# Patient Record
Sex: Female | Born: 1945 | Race: White | Hispanic: No | Marital: Married | Smoking: Never smoker
Health system: Southern US, Community
[De-identification: ages and names within clinical notes are randomized; demographics above are authoritative.]

## PROBLEM LIST (undated history)

## (undated) DIAGNOSIS — I351 Nonrheumatic aortic (valve) insufficiency: Secondary | ICD-10-CM

## (undated) DIAGNOSIS — I5189 Other ill-defined heart diseases: Secondary | ICD-10-CM

## (undated) DIAGNOSIS — M8580 Other specified disorders of bone density and structure, unspecified site: Secondary | ICD-10-CM

## (undated) DIAGNOSIS — I498 Other specified cardiac arrhythmias: Secondary | ICD-10-CM

## (undated) DIAGNOSIS — I2510 Atherosclerotic heart disease of native coronary artery without angina pectoris: Secondary | ICD-10-CM

## (undated) DIAGNOSIS — Z86010 Personal history of colonic polyps: Secondary | ICD-10-CM

## (undated) DIAGNOSIS — IMO0002 Reserved for concepts with insufficient information to code with codable children: Secondary | ICD-10-CM

## (undated) DIAGNOSIS — IMO0001 Reserved for inherently not codable concepts without codable children: Secondary | ICD-10-CM

## (undated) DIAGNOSIS — C50919 Malignant neoplasm of unspecified site of unspecified female breast: Secondary | ICD-10-CM

## (undated) DIAGNOSIS — R9430 Abnormal result of cardiovascular function study, unspecified: Secondary | ICD-10-CM

## (undated) HISTORY — PX: COLONOSCOPY: SHX174

## (undated) HISTORY — DX: Atherosclerotic heart disease of native coronary artery without angina pectoris: I25.10

## (undated) HISTORY — DX: Other specified disorders of bone density and structure, unspecified site: M85.80

## (undated) HISTORY — PX: OTHER SURGICAL HISTORY: SHX169

## (undated) HISTORY — PX: LYMPH NODE BIOPSY: SHX201

## (undated) HISTORY — DX: Malignant neoplasm of unspecified site of unspecified female breast: C50.919

## (undated) HISTORY — PX: MOUTH SURGERY: SHX715

## (undated) HISTORY — DX: Other ill-defined heart diseases: I51.89

## (undated) HISTORY — DX: Reserved for inherently not codable concepts without codable children: IMO0001

## (undated) HISTORY — DX: Other specified cardiac arrhythmias: I49.8

## (undated) HISTORY — DX: Personal history of colonic polyps: Z86.010

## (undated) HISTORY — DX: Nonrheumatic aortic (valve) insufficiency: I35.1

## (undated) HISTORY — PX: FACIAL COSMETIC SURGERY: SHX629

## (undated) HISTORY — DX: Reserved for concepts with insufficient information to code with codable children: IMO0002

## (undated) HISTORY — PX: BREAST RECONSTRUCTION: SHX9

## (undated) HISTORY — DX: Abnormal result of cardiovascular function study, unspecified: R94.30

---

## 1980-11-21 HISTORY — PX: OOPHORECTOMY: SHX86

## 1981-06-10 ENCOUNTER — Encounter (INDEPENDENT_AMBULATORY_CARE_PROVIDER_SITE_OTHER): Payer: Self-pay | Admitting: Gastroenterology

## 1982-11-11 ENCOUNTER — Encounter (INDEPENDENT_AMBULATORY_CARE_PROVIDER_SITE_OTHER): Payer: Self-pay | Admitting: *Deleted

## 1986-08-20 ENCOUNTER — Encounter: Payer: Self-pay | Admitting: Cardiology

## 1990-10-17 ENCOUNTER — Encounter (INDEPENDENT_AMBULATORY_CARE_PROVIDER_SITE_OTHER): Payer: Self-pay | Admitting: *Deleted

## 1993-11-21 HISTORY — PX: BREAST SURGERY: SHX581

## 1994-01-14 ENCOUNTER — Encounter: Payer: Self-pay | Admitting: Internal Medicine

## 1994-01-17 ENCOUNTER — Encounter (INDEPENDENT_AMBULATORY_CARE_PROVIDER_SITE_OTHER): Payer: Self-pay | Admitting: *Deleted

## 1994-01-19 ENCOUNTER — Encounter: Payer: Self-pay | Admitting: Internal Medicine

## 1994-01-21 ENCOUNTER — Encounter: Payer: Self-pay | Admitting: Internal Medicine

## 1994-01-22 ENCOUNTER — Encounter: Payer: Self-pay | Admitting: Internal Medicine

## 1994-01-27 ENCOUNTER — Encounter (INDEPENDENT_AMBULATORY_CARE_PROVIDER_SITE_OTHER): Payer: Self-pay | Admitting: *Deleted

## 1994-02-02 ENCOUNTER — Encounter: Payer: Self-pay | Admitting: Internal Medicine

## 1994-02-16 ENCOUNTER — Encounter (INDEPENDENT_AMBULATORY_CARE_PROVIDER_SITE_OTHER): Payer: Self-pay | Admitting: *Deleted

## 1995-02-14 ENCOUNTER — Encounter: Payer: Self-pay | Admitting: Internal Medicine

## 1999-02-05 ENCOUNTER — Other Ambulatory Visit: Admission: RE | Admit: 1999-02-05 | Discharge: 1999-02-05 | Payer: Self-pay | Admitting: Obstetrics and Gynecology

## 2001-06-28 ENCOUNTER — Other Ambulatory Visit: Admission: RE | Admit: 2001-06-28 | Discharge: 2001-06-28 | Payer: Self-pay | Admitting: Obstetrics and Gynecology

## 2002-06-25 ENCOUNTER — Encounter: Payer: Self-pay | Admitting: Cardiology

## 2002-07-16 ENCOUNTER — Other Ambulatory Visit: Admission: RE | Admit: 2002-07-16 | Discharge: 2002-07-16 | Payer: Self-pay | Admitting: Obstetrics and Gynecology

## 2003-09-12 ENCOUNTER — Other Ambulatory Visit: Admission: RE | Admit: 2003-09-12 | Discharge: 2003-09-12 | Payer: Self-pay | Admitting: Obstetrics and Gynecology

## 2003-12-24 ENCOUNTER — Ambulatory Visit (HOSPITAL_COMMUNITY): Admission: RE | Admit: 2003-12-24 | Discharge: 2003-12-24 | Payer: Self-pay | Admitting: Neurology

## 2004-02-02 ENCOUNTER — Encounter: Payer: Self-pay | Admitting: Cardiology

## 2004-05-17 ENCOUNTER — Emergency Department (HOSPITAL_COMMUNITY): Admission: EM | Admit: 2004-05-17 | Discharge: 2004-05-17 | Payer: Self-pay | Admitting: Emergency Medicine

## 2004-07-16 ENCOUNTER — Encounter: Payer: Self-pay | Admitting: Family Medicine

## 2004-10-27 ENCOUNTER — Other Ambulatory Visit: Admission: RE | Admit: 2004-10-27 | Discharge: 2004-10-27 | Payer: Self-pay | Admitting: Obstetrics and Gynecology

## 2005-11-17 ENCOUNTER — Ambulatory Visit: Payer: Self-pay | Admitting: Internal Medicine

## 2005-11-17 ENCOUNTER — Encounter (INDEPENDENT_AMBULATORY_CARE_PROVIDER_SITE_OTHER): Payer: Self-pay | Admitting: Specialist

## 2005-11-30 ENCOUNTER — Other Ambulatory Visit: Admission: RE | Admit: 2005-11-30 | Discharge: 2005-11-30 | Payer: Self-pay | Admitting: Obstetrics and Gynecology

## 2007-12-05 ENCOUNTER — Ambulatory Visit: Payer: Self-pay | Admitting: Internal Medicine

## 2007-12-05 ENCOUNTER — Encounter: Payer: Self-pay | Admitting: Internal Medicine

## 2008-01-18 ENCOUNTER — Ambulatory Visit: Payer: Self-pay | Admitting: Gastroenterology

## 2008-01-18 LAB — CONVERTED CEMR LAB
ALT: 21 units/L (ref 0–35)
AST: 23 units/L (ref 0–37)
Albumin: 4.2 g/dL (ref 3.5–5.2)
BUN: 13 mg/dL (ref 6–23)
Basophils Relative: 0.8 % (ref 0.0–1.0)
Bilirubin, Direct: 0.2 mg/dL (ref 0.0–0.3)
CO2: 29 meq/L (ref 19–32)
Eosinophils Relative: 2.5 % (ref 0.0–5.0)
Folate: 11.2 ng/mL
GFR calc Af Amer: 93 mL/min
GFR calc non Af Amer: 77 mL/min
Iron: 109 ug/dL (ref 42–145)
Lymphocytes Relative: 35 % (ref 12.0–46.0)
Monocytes Absolute: 0.6 10*3/uL (ref 0.2–0.7)
Monocytes Relative: 10.3 % (ref 3.0–11.0)
Platelets: 228 10*3/uL (ref 150–400)
RBC: 4.62 M/uL (ref 3.87–5.11)
RDW: 12.3 % (ref 11.5–14.6)
Sodium: 139 meq/L (ref 135–145)
TSH: 1.64 microintl units/mL (ref 0.35–5.50)
Total Bilirubin: 1 mg/dL (ref 0.3–1.2)
Vitamin B-12: 287 pg/mL (ref 211–911)

## 2008-01-24 ENCOUNTER — Other Ambulatory Visit: Admission: RE | Admit: 2008-01-24 | Discharge: 2008-01-24 | Payer: Self-pay | Admitting: Obstetrics and Gynecology

## 2009-02-03 ENCOUNTER — Ambulatory Visit: Payer: Self-pay | Admitting: Internal Medicine

## 2009-02-03 LAB — CONVERTED CEMR LAB
ALT: 18 units/L (ref 0–35)
Albumin: 3.7 g/dL (ref 3.5–5.2)
Basophils Relative: 0.6 % (ref 0.0–3.0)
CO2: 28 meq/L (ref 19–32)
Eosinophils Absolute: 0.2 10*3/uL (ref 0.0–0.7)
Eosinophils Relative: 3.1 % (ref 0.0–5.0)
GFR calc non Af Amer: 76.98 mL/min (ref >60)
Glucose, Bld: 101 mg/dL — ABNORMAL HIGH (ref 70–99)
HCT: 38.7 % (ref 36.0–46.0)
Hemoglobin: 12.7 g/dL (ref 12.0–15.0)
Iron: 95 ug/dL (ref 42–145)
MCV: 90.3 fL (ref 78.0–100.0)
Saturation Ratios: 31.1 % (ref 20.0–50.0)
Sodium: 141 meq/L (ref 135–145)
TSH: 2.98 microintl units/mL (ref 0.35–5.50)
Total Protein: 6.3 g/dL (ref 6.0–8.3)
Transferrin: 218.4 mg/dL (ref 212.0–360.0)
WBC: 5.5 10*3/uL (ref 4.5–10.5)

## 2009-02-04 ENCOUNTER — Encounter: Payer: Self-pay | Admitting: Obstetrics and Gynecology

## 2009-02-04 ENCOUNTER — Ambulatory Visit: Payer: Self-pay | Admitting: Obstetrics and Gynecology

## 2009-02-04 ENCOUNTER — Other Ambulatory Visit: Admission: RE | Admit: 2009-02-04 | Discharge: 2009-02-04 | Payer: Self-pay | Admitting: Obstetrics and Gynecology

## 2009-02-06 LAB — CONVERTED CEMR LAB: Vit D, 25-Hydroxy: 20 ng/mL — ABNORMAL LOW (ref 30–89)

## 2009-03-19 ENCOUNTER — Encounter: Payer: Self-pay | Admitting: Cardiology

## 2009-04-01 ENCOUNTER — Ambulatory Visit: Payer: Self-pay

## 2009-04-01 ENCOUNTER — Encounter: Payer: Self-pay | Admitting: Cardiology

## 2009-05-26 ENCOUNTER — Encounter: Payer: Self-pay | Admitting: Internal Medicine

## 2009-08-28 ENCOUNTER — Ambulatory Visit: Payer: Self-pay | Admitting: Internal Medicine

## 2009-08-28 DIAGNOSIS — Z853 Personal history of malignant neoplasm of breast: Secondary | ICD-10-CM | POA: Insufficient documentation

## 2009-08-28 DIAGNOSIS — C50919 Malignant neoplasm of unspecified site of unspecified female breast: Secondary | ICD-10-CM | POA: Insufficient documentation

## 2009-09-01 ENCOUNTER — Ambulatory Visit: Payer: Self-pay | Admitting: Internal Medicine

## 2009-12-18 ENCOUNTER — Telehealth: Payer: Self-pay | Admitting: Internal Medicine

## 2009-12-29 ENCOUNTER — Ambulatory Visit: Payer: Self-pay | Admitting: Gastroenterology

## 2009-12-29 LAB — CONVERTED CEMR LAB: Vit D, 25-Hydroxy: 24 ng/mL — ABNORMAL LOW (ref 30–89)

## 2009-12-30 ENCOUNTER — Telehealth: Payer: Self-pay | Admitting: Internal Medicine

## 2010-01-04 ENCOUNTER — Telehealth: Payer: Self-pay | Admitting: Internal Medicine

## 2010-02-05 ENCOUNTER — Ambulatory Visit: Payer: Self-pay | Admitting: Family Medicine

## 2010-02-05 ENCOUNTER — Encounter: Payer: Self-pay | Admitting: Internal Medicine

## 2010-02-10 ENCOUNTER — Ambulatory Visit: Payer: Self-pay | Admitting: Obstetrics and Gynecology

## 2010-02-10 ENCOUNTER — Other Ambulatory Visit: Admission: RE | Admit: 2010-02-10 | Discharge: 2010-02-10 | Payer: Self-pay | Admitting: Obstetrics and Gynecology

## 2010-02-11 ENCOUNTER — Encounter: Payer: Self-pay | Admitting: Internal Medicine

## 2010-03-07 ENCOUNTER — Encounter: Payer: Self-pay | Admitting: Internal Medicine

## 2010-03-10 ENCOUNTER — Ambulatory Visit (HOSPITAL_COMMUNITY): Admission: RE | Admit: 2010-03-10 | Discharge: 2010-03-10 | Payer: Self-pay | Admitting: Cardiology

## 2010-03-10 ENCOUNTER — Ambulatory Visit: Payer: Self-pay

## 2010-03-10 ENCOUNTER — Ambulatory Visit: Payer: Self-pay | Admitting: Cardiology

## 2010-03-10 ENCOUNTER — Encounter: Payer: Self-pay | Admitting: Cardiology

## 2010-03-23 ENCOUNTER — Encounter: Payer: Self-pay | Admitting: Cardiology

## 2010-03-24 ENCOUNTER — Ambulatory Visit: Payer: Self-pay | Admitting: Cardiology

## 2010-04-08 ENCOUNTER — Telehealth: Payer: Self-pay | Admitting: Internal Medicine

## 2010-07-02 ENCOUNTER — Encounter: Admission: RE | Admit: 2010-07-02 | Discharge: 2010-07-02 | Payer: Self-pay | Admitting: Gastroenterology

## 2010-07-02 ENCOUNTER — Encounter: Payer: Self-pay | Admitting: Gastroenterology

## 2010-07-02 DIAGNOSIS — M25559 Pain in unspecified hip: Secondary | ICD-10-CM | POA: Insufficient documentation

## 2010-07-23 ENCOUNTER — Encounter: Payer: Self-pay | Admitting: Cardiology

## 2010-09-10 ENCOUNTER — Ambulatory Visit: Payer: Self-pay | Admitting: Cardiovascular Disease

## 2010-09-10 LAB — CONVERTED CEMR LAB
Calcium: 9.8 mg/dL (ref 8.4–10.5)
Chloride: 104 meq/L (ref 96–112)
GFR calc non Af Amer: 81.26 mL/min (ref >60)
Potassium: 4.8 meq/L (ref 3.5–5.1)
Sodium: 138 meq/L (ref 135–145)

## 2010-09-15 ENCOUNTER — Encounter: Payer: Self-pay | Admitting: Cardiovascular Disease

## 2010-09-16 ENCOUNTER — Encounter (INDEPENDENT_AMBULATORY_CARE_PROVIDER_SITE_OTHER): Payer: Self-pay | Admitting: *Deleted

## 2010-09-17 ENCOUNTER — Ambulatory Visit: Payer: Self-pay | Admitting: Internal Medicine

## 2010-09-20 ENCOUNTER — Encounter: Payer: Self-pay | Admitting: Internal Medicine

## 2010-09-21 ENCOUNTER — Ambulatory Visit: Payer: Self-pay | Admitting: Oncology

## 2010-09-22 ENCOUNTER — Ambulatory Visit: Payer: Self-pay | Admitting: Internal Medicine

## 2010-09-22 ENCOUNTER — Encounter (INDEPENDENT_AMBULATORY_CARE_PROVIDER_SITE_OTHER): Payer: Self-pay

## 2010-09-22 ENCOUNTER — Ambulatory Visit: Payer: Self-pay

## 2010-09-22 ENCOUNTER — Encounter: Payer: Self-pay | Admitting: Cardiology

## 2010-09-22 ENCOUNTER — Ambulatory Visit (HOSPITAL_COMMUNITY): Admission: RE | Admit: 2010-09-22 | Discharge: 2010-09-22 | Payer: Self-pay | Admitting: Cardiology

## 2010-09-24 ENCOUNTER — Ambulatory Visit (HOSPITAL_COMMUNITY): Admission: RE | Admit: 2010-09-24 | Discharge: 2010-09-24 | Payer: Self-pay | Admitting: Cardiovascular Disease

## 2010-09-27 ENCOUNTER — Encounter (INDEPENDENT_AMBULATORY_CARE_PROVIDER_SITE_OTHER): Payer: Self-pay | Admitting: *Deleted

## 2010-09-27 ENCOUNTER — Encounter: Admission: RE | Admit: 2010-09-27 | Discharge: 2010-09-27 | Payer: Self-pay | Admitting: Surgery

## 2010-10-11 ENCOUNTER — Encounter: Payer: Self-pay | Admitting: Cardiology

## 2010-10-13 ENCOUNTER — Encounter: Payer: Self-pay | Admitting: Cardiology

## 2010-11-11 ENCOUNTER — Telehealth: Payer: Self-pay | Admitting: Internal Medicine

## 2010-11-26 ENCOUNTER — Encounter: Payer: Self-pay | Admitting: Internal Medicine

## 2010-12-12 ENCOUNTER — Encounter: Payer: Self-pay | Admitting: Gastroenterology

## 2010-12-14 ENCOUNTER — Encounter: Payer: Self-pay | Admitting: Cardiology

## 2010-12-21 NOTE — Miscellaneous (Signed)
  Clinical Lists Changes  Observations: Added new observation of CARDIO HPI: The patient is scheduled to have a cardiac CT angiogram of the guidance of Dr.Nishan in October, 2011.  We will be calling her son to arrange followup echo as outlined before. (07/23/2010 13:16) Added new observation of VISIT TYPE: chart update (07/23/2010 13:16) Added new observation of PRIMARY MD: Illene Regulus, MD (07/23/2010 13:16)      Visit Type:  chart update Primary Provider:  Illene Regulus, MD   History of Present Illness: The patient is scheduled to have a cardiac CT angiogram of the guidance of Dr.Nishan in October, 2011.  We will be calling her son to arrange followup echo as outlined before.

## 2010-12-21 NOTE — Letter (Signed)
Summary: [facility]  [facility]   Imported By: Lanelle Bal 09/27/2010 13:28:56  _____________________________________________________________________  External Attachment:    Type:   Image     Comment:   External Document

## 2010-12-21 NOTE — Miscellaneous (Signed)
  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 Dara Lords [facility]                   Derma - Graylon Gunning                  Neuro - Dr. 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 Dara Lords [facility]                   Derma - Graylon Gunning                  Neuro - Dr. Avie Echevaria                  Cardiology - Myrtis Ser

## 2010-12-21 NOTE — Miscellaneous (Signed)
Summary: Appointment Canceled  Appointment status changed to canceled by [employer] on 08/27/2010 3:14 PM.  Cancellation Comments --------------------- AI 424.1/FC  Appointment Information ----------------------- Appt Type:  CARDIOLOGY ANCILLARY VISIT      Date:  Monday, August 30, 2010      Time:  3:00 PM for 60 min   Urgency:  Routine   Made By:  Pearson Grippe  To Visit:  [id number]    Reason:  AI 424.1/FC  Appt Comments ------------- -- 08/27/10 15:14: ([facility]) CANCELED -- AI 424.1/FC -- 08/26/10 10:06: ([facility]) BOOKED -- Routine CARDIOLOGY ANCILLARY VISIT at 08/30/2010 3:00 PM for 60 min AI 424.1/FC

## 2010-12-21 NOTE — Progress Notes (Signed)
  Phone Note Refill Request Message from:  Fax from Pharmacy on January 04, 2010 8:24 AM  Refills Requested: Medication #1:  ERGOCALCIFEROL 50000 UNIT CAPS 1 by mouth weekly x 8. Initial call taken by: Ami Bullins CMA,  January 04, 2010 8:24 AM    Prescriptions: ERGOCALCIFEROL 50000 UNIT CAPS (ERGOCALCIFEROL) 1 by mouth weekly x 8  #8 x 0   Entered by:   Ami Bullins CMA   Authorized by:   Jacques Navy MD   Signed by:   Bill Salinas CMA on 01/04/2010   Method used:   Electronically to        [facility]* (retail)       [address]       [address] Shipping/mailing       [place], [place]  [address]       Ph: [phone]       Fax: [phone]   RxID:   [id number]

## 2010-12-21 NOTE — Progress Notes (Signed)
  Phone Note Refill Request Message from:  Fax from Pharmacy on Apr 08, 2010 8:45 AM  Refills Requested: Medication #1:  XANAX 0.25 MG TABS as needed   Last Refilled: 09/02/2009 recieved fax from [facility] out pt Pharm . Please Advise refills  Initial call taken by: Ami Bullins CMA,  Apr 08, 2010 8:46 AM  Follow-up for Phone Call        ok for refill # 50,2 refills Follow-up by: Jacques Navy MD,  Apr 08, 2010 8:56 AM    Prescriptions: Prudy Feeler 0.25 MG TABS (ALPRAZOLAM) as needed  #50 x 2   Entered by:   Bill Salinas CMA   Authorized by:   Jacques Navy MD   Signed by:   Bill Salinas CMA on 04/08/2010   Method used:   Telephoned to ...       [facility] [address] [phone]* (retail)       [address]       [place], [place]  [address]       Ph: [phone] or [phone]       Fax: [phone]   RxID:   [id number]

## 2010-12-21 NOTE — Miscellaneous (Signed)
Summary: R Hip and LSpine XRay  Clinical Lists Changes  Problems: Added new problem of HIP PAIN, RIGHT (ICD-719.45) Orders: Added new Test order of T-Bilateral Hip w/Pelvis, min 2 views (73520TC) - Signed Added new Test order of T-Lumbar Spine Complete, 5 Views [phone]) - Signed

## 2010-12-21 NOTE — Miscellaneous (Signed)
Summary: IV for Bubble Study  Clinical Lists Changes     IV 22G (R) AC for Bubble Study. Patsy Edwards,RN.

## 2010-12-21 NOTE — Op Note (Signed)
Summary: Left Total Mastectomy/[facility]  Left Total Mastectomy/[facility]   Imported By: Lanelle Bal 09/27/2010 13:22:21  _____________________________________________________________________  External Attachment:    Type:   Image     Comment:   External Document

## 2010-12-21 NOTE — Letter (Signed)
Summary: Cyndia Bent MD  Cyndia Bent MD   Imported By: Lanelle Bal 09/27/2010 13:25:09  _____________________________________________________________________  External Attachment:    Type:   Image     Comment:   External Document

## 2010-12-21 NOTE — Letter (Signed)
   [facility] [address] [place], [place]  [address] Phone: [phone]      March 10, 2010   [facility] [address] [place], [place] [address]  RE:  LAB RESULTS  Dear  Ms. Mczeal,  The following is an interpretation of your most recent lab tests.  Please take note of any instructions provided or changes to medications that have resulted from your lab work.   Your bone density study reveals mildly negative T-scores for spine and hip that put you into the category of osteopenia by world health organization standards. Your fracture risk over the next 10 years is 10.4% any fracture with normal risk being 11.6%; 1.6% hip fracture risk with normal risk of 2.0%   Recommendation: 1200 mg calcium daily combining dietary sources and supplement. With a Vitamin D level of 20+ you should take between 800-1000iu Vitamin D daily - an OTC dose. Continue regular weight bearing exercise.    Call or e-mail me if you have questions ([email or web address]).   Sincerely Yours,    Jacques Navy MD

## 2010-12-21 NOTE — Miscellaneous (Signed)
  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 slight doming of the valve  ??, not sure if 2 or 3 aortic valve cusps--possible 3 cusps with fused commissure between the left and right cusp EF 55-60%  ECHO...2011 MR... trace... echo... April, 2011   slight dilatation... echo.... April, 2011 Right ventricle and right atrium salpiangectomy right '82 - ( gottsegen) modified radical mastectomy left '95,right '95 - immediate reconstruction Replacement of implants - '97 (Holderness) Vein surgery for varicose veins - Krush Basal cell chest wall '09 -Graylon Gunning  Physician Roster:                  Gyn - Dr. Dara Lords - [facility]                   Derma - Graylon Gunning                  Neuro - Dr. 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 slight doming of the valve  ??, not sure if 2 or 3  aortic valve cusps--possible 3 cusps with fused commissure between the left and right cusp EF 55-60%  ECHO...2011 MR... trace... echo... April, 2011   slight dilatation... echo.... April, 2011 Right ventricle and right atrium salpiangectomy right '82 - ( gottsegen) modified radical mastectomy left '95,right '95 - immediate reconstruction Replacement of implants - '97 (Holderness) Vein surgery for varicose veins - Krush Basal cell chest wall '09 -Graylon Gunning  Physician Roster:                  Gyn - Dr. Dara Lords [facility]                   Derma - Graylon Gunning                  Neuro - Dr. Avie Echevaria                  Cardiology - Myrtis Ser

## 2010-12-21 NOTE — Progress Notes (Signed)
  Phone Note Outgoing Call   Reason for Call: Discuss lab or test results Summary of Call: Hi Valerie Phelps!  24 is still D insufficient. I recommend another 8 weeks of ergocaliferol 50,000 international units weekly and then 1000 international units daily. Rx eScribed to pharmacy. Thanks for your help with [name] Amazing that he had no abnormalities. Unfortunately he passed away, but at least he was made comfortable at the end.  Ciao Initial call taken by: Jacques Navy MD,  December 30, 2009 7:53 PM    New/Updated Medications: ERGOCALCIFEROL 50000 UNIT CAPS (ERGOCALCIFEROL) 1 by mouth weekly x 8 Prescriptions: ERGOCALCIFEROL 50000 UNIT CAPS (ERGOCALCIFEROL) 1 by mouth weekly x 8  #8 x 0   Entered and Authorized by:   Jacques Navy MD   Signed by:   Jacques Navy MD on 12/30/2009   Method used:   [facility] to        [facility] [address] [phone]* (retail)       [address]       [place], [place]  [address]       Ph: [phone] or [phone]       Fax: [phone]   RxID:   [id number]

## 2010-12-21 NOTE — Miscellaneous (Signed)
Summary: BONE DENSITY  Clinical Lists Changes  Orders: Added new Test order of T-Bone Densitometry (77080) - Signed Added new Test order of T-Lumbar Vertebral Assessment (77082) - Signed 

## 2010-12-21 NOTE — Assessment & Plan Note (Addendum)
Summary: np6/aortic insuffiency   Visit Type:  Initial Consult Primary Provider:  Illene Regulus, MD  CC:  aortic insufficiency.  History of Present Illness: The patient is seen for the evaluation of aortic insufficiency.  She is in excellent physical condition and she is a long distance runner.  She is not having any significant symptoms.  She says that her times have been getting longer, but she does not feel any significant fatigue.  She has not had chest pain.  There's been no syncope or presyncope. Two-dimensional echo had been done in May of 2010.  The study showed an ejection fraction of 60%.  There was aortic insufficiency that was felt to be mild to moderate.  She then had a followup echo on March 10, 2010.  There is no significant change.  Ejection fraction is 60%.  There is mild to moderate or moderate aortic insufficiency.  The pressure half-time of the AI signal is 950 ms consistent with mild aortic insufficiency.  The left ventricle is nondilated.  End-diastolic dimension is 44 mm and end-systolic dimension is 32 mm.  Wall thickness is 9-10 mm.  There is mild tricuspid regurgitation.  Right heart pressure was not estimated from this study.  In 2010 the RV pressure was 30 mm of mercury.  There is very slight dilatation of the right ventricle and there is mild dilatation of the right atrium and left atrium.  During the study the patient had atrial bigeminy.  I am not sure of the anatomy of the aortic valve.  It seems most likely that is tricuspid.  However I am not completely sure that we can rule out a bicuspid valve.  Current Medications (verified): 1)  Diclofenac Sodium 75 Mg Tbec (Diclofenac Sodium) .... Take 1 Tablet Two Times A Day As Needed 2)  Estradiol-Norethindrone Acet 1-0.5 Mg Tabs (Estradiol-Norethindrone Acet) .... Take 1 Tablet By Mouth Once Daily 3)  Xanax 0.25 Mg Tabs (Alprazolam) .... As Needed 4)  Estrace 0.1 Mg/gm Crea (Estradiol) .... 3 X A Week 5)  Vitamin D +  Calcium 1000units .... 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 slight doming of the valve  ??, not sure if 2 or 3 aortic valve cusps--possible 3 cusps with fused commissure between the left and right cusp EF 55-60%  ECHO...2011 MR... trace... echo... April, 2011   slight dilatation... echo.... April, 2011 Right ventricle and right atrium salpiangectomy right '82 - ( gottsegen) modified radical mastectomy left '95,right '95 - immediate reconstruction Replacement of implants - '97 (Holderness) Vein surgery for varicose veins - Krush Basal cell chest wall '09 -Graylon Gunning  Physician Roster:                  Gyn - Dr. Dara Lords - [facility]                   Derma - Graylon Gunning  Neuro - Dr. Avie Echevaria                  Cardiology - Myrtis Ser  Review of Systems       Patient denies fever, chills, headache, sweats, rash, change in vision, change in hearing, chest pain, cough, nausea vomiting, urinary symptoms.  All of the systems are reviewed and are negative.  Vital Signs:  Patient profile:   65 year old female Height:      65 inches Weight:      128 pounds BMI:     21.38 Pulse rate:   44 / minute BP sitting:   122 / 68  (left arm) Cuff size:   regular  Vitals Entered By: Stanton Kidney, EMT-P (Mar 24, 2010 4:01 PM)  Physical Exam  General:  patient is stable. Head:  head is atraumatic. Eyes:  no xanthelasma Neck:  carotid  pulses are normal.  There no carotid bruits. Chest Wall:  chest wall is nontender.  The patient has replacement breast implants after bilateral mastectomy. Lungs:  lungs are clear.  Respiratory effort is nonlabored. Heart:  cardiac exam reveals either a split S1 or an ejection click.  There is a soft systolic murmur.  At the time of today's evaluation I do not hear significant aortic insufficiency.   Abdomen:  the abdomen is soft. Msk:  no musculoskeletal deformities. Extremities:  no peripheral edema. Skin:  no skin rashes. Psych:  patient is oriented to person time and place.  Affect is normal.   Impression & Recommendations:  Problem # 1:  ARRHYTHMIA, HX OF (ICD-V12.50) EKG today reveals marked sinus bradycardia with increased voltage.  Older EKG reveals that at times the patient has a shorter PR interval.  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  When she has this rhythm at times she also has atrial bigeminy.  No further workup is needed.  Problem # 2:  AORTIC REGURGITATION (ICD-424.1) The patient has mild to moderate or moderate aortic insufficiency.  Left ventricular chamber size is normal in diastole and systole.  Systolic wall motion is normal with ejection fraction of 60%.  The pressure half-time of the AI jet is 950 ms which is consistent with mild AI.  Left ventricular walls are not thickened.  There is no significant flow reversal in the descending aorta.  All of the data supports that this is not severe aortic insufficiency.  A formal recommendation at this time does not support SBE prophylaxis.  There is no evidence that vasodilators or other medications are recommended in this setting.  There is also no evidence that would suggest that she should change her running pattern.  I have told her that it is okay for her to continue to run.  I feel that she should not press excessively to try to keep her times at their best.  She should not be involved in prolonged isometric exercise.  I  want to be very complete concerning the anatomy of her valve.  I believe that there is slight doming of this valve.  If there is any chance that this is a bicuspid valve I want to be sure that we know.  She will have an MRI MRA to assess the valve and to assess her entire aorta.  She will have a yearly echo and follow up with me.  The patient does have mild increase in her right ventricular size with normal function.  There is some dilatation of the right atrium.  I  doubt that these are pathologic findings.  Additional echo to  document her right heart pressure will be arranged.  In 2010 this was normal.  She also has incidental mild mitral regurgitation.

## 2010-12-21 NOTE — Progress Notes (Signed)
  Phone Note Other Incoming   Summary of Call: Dr Juanda Chance came into the office and she was questioning if you wanted her to start a Vitamin D suppliment she was thinking it was 8000 units. Is there such unit. I looked it up and all I saw was a 2000 unit OTC suppliment. Please Advise thank you. Pt is aware you are not in the office today. Initial call taken by: Ami Bullins CMA,  December 18, 2009 1:25 PM  Follow-up for Phone Call        1. ok to order a vit D level if not already done. 2. for routine maintenance [phone] international units daily Follow-up by: Jacques Navy MD,  December 18, 2009 2:15 PM  Additional Follow-up for Phone Call Additional follow up Details #1::        I left message with Dr Regino Schultze assistant Karen Kitchens. I will put lab in IDX Additional Follow-up by: Ami Bullins CMA,  December 18, 2009 2:21 PM

## 2010-12-21 NOTE — Op Note (Signed)
Summary: Left Breast Reconstruction/[facility]  Left Breast Reconstruction/[facility]   Imported By: Lanelle Bal 09/27/2010 13:28:11  _____________________________________________________________________  External Attachment:    Type:   Image     Comment:   External Document

## 2010-12-21 NOTE — Miscellaneous (Signed)
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 slight doming of the valve  ??, not sure if 2 or 3 aortic valve cusps--possible 3 cusps with fused commissure between the left and right cusp  /  cardiac CTA October, 2011 - the valve is bicuspid.... there is no aneurysmal dilatation of the aorta EF 55-60%  ECHO...2011 Diastolic dysfunction   by echo criteria... November, 2011... moderate left atrial dilatation.. moderate right atrial dilatation.. right ventricular systolic pressure estimate 29 mmHg MR... trace... echo... April, 2011   slight dilatation... echo.... April, 2011 Right ventricle and right atrium salpiangectomy right '82 - ( gottsegen) modified radical mastectomy left '95,right '95 - immediate reconstruction Replacement of implants - '97 (Holderness) Vein surgery for varicose veins - Krush Basal cell chest wall '09 -Graylon Gunning Enlarged lymph nodes noted  Physician Roster:                  Gyn - Dr. [facility]                   Derma - Graylon Gunning                  Neuro - Dr. 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 slight doming of the valve  ??, not sure if 2 or 3 aortic valve cusps--possible 3 cusps with fused commissure between the left and right cusp  /  cardiac CTA October, 2011 - the valve is bicuspid.... there is no aneurysmal dilatation of the aorta EF 55-60%  ECHO...2011 Diastolic dysfunction   by echo criteria... November, 2011... moderate left atrial dilatation.. moderate right atrial dilatation.. right ventricular systolic pressure estimate 29 mmHg MR... trace... echo... April, 2011   slight dilatation... echo.... April, 2011 Right ventricle and right atrium salpiangectomy right '82 - ( gottsegen) modified radical mastectomy left '95,right '95 - immediate reconstruction Replacement of implants - '97 (Holderness) Vein surgery for varicose veins - Krush Basal cell chest wall '09 -Graylon Gunning Enlarged lymph nodes noted  Physician Roster:                  Gyn - Dr. Dara Lords [facility]                   Derma - Graylon Gunning  Neuro - Dr. Avie Echevaria                  Cardiology - Myrtis Ser

## 2010-12-21 NOTE — Miscellaneous (Signed)
Clinical Lists Changes  Observations: Added new observation of PAST MED HX: COLONIC POLYPS, HYPERPLASTIC, HX OF (ICD-V12.72) ADENOCARCINOMA, BREAST, ER POSITIVE (ICD-174.9) Atrial Bigeminy Aortic Regurgitation  moderate...echo..2010  /   echo April, 2011.Marland KitchenMarland KitchenEDD 48mm, ESD 32mm, Pressure Halftime    1362 ms, no diastolic flow reversal in the descending aorta, aortic root 34 mm, vena contracta not significantly   widened, moderate aortic insufficiency, no change from 2010, ?? slight doming of the valve  ??, not sure if 2 or   3 aortic valve cusps--possible 3 cusps with fused commissure between the left and right cusp  /  cardiac CTA   October, 2011 - the aortic valve is tricuspid. with fused commissure..... there is no aneurysmal dilatation of any   part of the aorta..no further evaluation for bicuspid valve needed. EF 55-60%  ECHO...2011 Diastolic dysfunction     significant  by echo criteria... November, 2011... moderate left atrial dilatation.. moderate   right atrial dilatation.. right ventricular systolic pressure estimate 29 mmHg MR... trace... echo... April, 2011   Right ventricle   mild dilitation...echo...09/2010.Marland Kitchengood function salpiangectomy right '82 - ( gottsegen) modified radical mastectomy left '95,right '95 - immediate reconstruction Replacement of implants - '97 ([facility]) Vein surgery for varicose veins - [facility] Basal cell chest wall '09 -Graylon Gunning Enlarged lymph nodes noted on chest CT AND PET 08/2010....node biopsy benign  Physician Roster:                  Gyn - Dr. Dara Lords - [facility]                   Derma - Graylon Gunning                  Neuro - Dr. Avie Echevaria                  Cardiology - Myrtis Ser   (10/13/2010 17:52) Added new observation of PRIMARY MD: Illene Regulus, MD (10/13/2010 17:52)       Past History:  Past Medical History: COLONIC POLYPS, HYPERPLASTIC, HX OF (ICD-V12.72) ADENOCARCINOMA, BREAST, ER POSITIVE  (ICD-174.9) Atrial Bigeminy Aortic Regurgitation  moderate...echo..2010  /   echo April, 2011.Marland KitchenMarland KitchenEDD 48mm, ESD 32mm, Pressure Halftime    1362 ms, no diastolic flow reversal in the descending aorta, aortic root 34 mm, vena contracta not significantly   widened, moderate aortic insufficiency, no change from 2010, ?? slight doming of the valve  ??, not sure if 2 or   3 aortic valve cusps--possible 3 cusps with fused commissure between the left and right cusp  /  cardiac CTA   October, 2011 - the aortic valve is tricuspid. with fused commissure..... there is no aneurysmal dilatation of any   part of the aorta..no further evaluation for bicuspid valve needed. EF 55-60%  ECHO...2011 Diastolic dysfunction     significant  by echo criteria... November, 2011... moderate left atrial dilatation.. moderate   right atrial dilatation.. right ventricular systolic pressure estimate 29 mmHg MR... trace... echo... April, 2011   Right ventricle   mild dilitation...echo...09/2010.Marland Kitchengood function salpiangectomy right '82 - ( gottsegen) modified radical mastectomy left '95,right '95 - immediate reconstruction Replacement of implants - '97 ([facility]) Vein surgery for varicose veins - [facility] Basal cell chest wall '09 -Graylon Gunning Enlarged lymph nodes noted on chest CT AND PET 08/2010....node biopsy benign  Physician Roster:  Gyn - Dr. Dara Lords - [facility]                   Derma - Graylon Gunning                  Neuro - Dr. Avie Echevaria                  Cardiology - Myrtis Ser

## 2010-12-21 NOTE — Op Note (Signed)
Summary: Right Total Mastectomy/[facility]  Right Total Mastectomy/[facility]   Imported By: Lanelle Bal 09/27/2010 13:30:45  _____________________________________________________________________  External Attachment:    Type:   Image     Comment:   External Document

## 2010-12-23 NOTE — Assessment & Plan Note (Signed)
Summary: [facility]   Visit Type:  SUMMARY OF CARDIAC FINDINGS  12/14/2010   History of Present Illness: On December 02, 2010 I met with Dr.Rick Ursula Alert of the [facility] at a meeting in [place].  I presented the entire history to him. This included the fact that the patient has exercised ( running) for many years and continues to do so ( although her times are getting a little  longer).  He personally reviewed the echo images on a CD that I have brought.  He felt that the overall picture probably was NOT that of significant diastolic dysfunction.  He noted that the right ventricle probably was not significantly dilated despite some of the standard parameters.  Specifically he mentioned that the right ventricle did not overtake the apex.  He felt that a long history of running might be playing a role in some of the diastolic parameters  obtained.  He noted that there appears to be some increased cardiac output at rest. He wondered if this could play a role in the increased atrial size.  He suggested that we be sure that there was no potential reason for this.  The patient's thyroid functions recently have been normal.  There is no known significant AV fistula.  He did not make a formal recommendation to do an MRI.  He felt that looking at diastolic parameters with exercise might add some information, specifically the E/E' ratio.  He also thought that a  stress echo might be helpful.  If this is normal, it  argues strongly against any type of cardiomyopathy.  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 No evidence of PFO or ASD ( bubble study)  RV size  visually mildly increased, but not  significantly Right atrial size  28 cm  increased PA pressure  29 mm of mercury TAPSE  2.6  ( normal) assessment of right ventricular annulus motion   Allergies: No Known Drug Allergies   Impression & Recommendations:  Problem # 1:   CONTINUATION OF ECHO & CTA FINDINGS: RV lateral wall velocity  16.3  ( normal) E/A  1.4  (increased) Deceleration time  130   (reduced) Pulmonary veins..... not assessed by echo Medial  E' = 7  ;           E/E' = 14.5   (mild elevation)      The combination of elevated E/A ratio, short deceleration time, and increased E/E' are the parameters used in the echo report to use the term restrictive physiology.  This is one of the echo terms used in the spectrum of  diastolic dysfunction  ( another term used  for restrictive is Grade III diastolic dysfunction).  However it is known that young athletes can sometimes have an elevated E/A and a short deceleration time.  This makes it difficult to be sure what these parameters mean in this patient who has done significant running over many many years.       There is no recommendation to use any medications in this normotensive, asymptomatic, highly trained 65 year old patient at this time.        It is felt that the patient's aortic insufficiency  does not play any significant  role in the diastolic parameters.     The patient has not had a TEE.  It appears that this would not add any information at this time.  Cardiac MRI would be of academic interest, but it seems unlikely that it would change the course of therapy at this time.  This study can be considered in the future.  Calcium score  61 (CTA)... Small nidus mid LAD and RCA.  LAD < 30%, Dx < 30%   ( The patient overall is at very low risk.  However, with these calcium findings, consideration could be given to the use of a statin.)  The patient had breast cancer treated with bilateral mastectomies and replacement implants.  There was no chemotherapy or radiation  therapy that could be playing a role in the current diastolic parameters.  The patient has bradycardia with frequent persistent atrial bigeminy.  There is no way to prove that this rhythm could be playing any type of role.  The patient was in sinus rhythm at the time of the echo providing the parameters listed above.

## 2010-12-23 NOTE — Letter (Signed)
Summary: Colonoscopy Letter  [facility]  [address]   [place], [place] [address]   Phone: [phone]  Fax: [phone]      November 26, 2010 MRN: [id number]   [facility] [address] [place], [place]  [address]   Dear Valerie Phelps,   According to your medical record, it is time for you to schedule a Colonoscopy. The American Cancer Society recommends this procedure as a method to detect early colon cancer. Patients with a family history of colon cancer, or a personal history of colon polyps or inflammatory bowel disease are at increased risk.  This letter has been generated based on the recommendations made at the time of your procedure. If you feel that in your particular situation this may no longer apply, please contact our office.  Please call our office at [phone] to schedule this appointment or to update your records at your earliest convenience.  Thank you for cooperating with [place] to provide you with the very best care possible.   Sincerely,   Stan Head, M.D.  [facility] [phone]

## 2010-12-23 NOTE — Miscellaneous (Signed)
Summary: Orders Update  Clinical Lists Changes  Orders: Added new Referral order of Radiology Referral (Radiology) - Signed Added new Referral order of Oncology Referral (Oncology) - Signed

## 2010-12-23 NOTE — Progress Notes (Signed)
  Phone Note Refill Request Message from:  Fax from Pharmacy on November 11, 2010 10:54 AM  Refills Requested: Medication #1:  ESTRACE 0.1 MG/GM CREA 3 x a week  Medication #2:  ESTRADIOL-NORETHINDRONE ACET 1-0.5 MG TABS Take 1 tablet by mouth once daily Initial call taken by: Ami Bullins CMA,  November 11, 2010 10:56 AM    Prescriptions: ESTRACE 0.1 MG/GM CREA (ESTRADIOL) 3 x a week  #42.5g x 3   Entered by:   Ami Bullins CMA   Authorized by:   Jacques Navy MD   Signed by:   Bill Salinas CMA on 11/11/2010   Method used:   Electronically to        [facility]* (retail)       [address]       [address]       [place], [place]  [address]       Ph: [phone]       Fax: [phone]   RxID:   [id number] ESTRADIOL-NORETHINDRONE ACET 1-0.5 MG TABS (ESTRADIOL-NORETHINDRONE ACET) Take 1 tablet by mouth once daily  #30 x 12   Entered by:   Ami Bullins CMA   Authorized by:   Jacques Navy MD   Signed by:   Bill Salinas CMA on 11/11/2010   Method used:   Electronically to        [facility]* (retail)       [address]       [address]mail[address]       [place], [place]  [address]       Ph: [phone]       Fax: [phone]   RxID:   [id number]

## 2010-12-30 ENCOUNTER — Telehealth: Payer: Self-pay | Admitting: Internal Medicine

## 2011-01-03 ENCOUNTER — Other Ambulatory Visit: Payer: Self-pay

## 2011-01-06 NOTE — Progress Notes (Signed)
Summary: new colon recall for 12/13  Phone Note Outgoing Call   Summary of Call: We discussed possible repeat colonoscopy at this time. She had a 1 cm hyperplastic polyp (repeat review by Dr. Frederica Kuster confirms)removed from splenic flexure 5 years ago. Some experts would repeat colonoscopy now but there is no one right answer (i.e. could go longer). we have decided to revisit this with a recall for 7 years from last colonoscopy which will be 10/2012 Iva Boop MD, [facility]  December 30, 2010 1:38 PM   Follow-up for Phone Call        recall colon entered in EPIC for 10/2012 Follow-up by: Darcey Nora RN, CGRN,  December 30, 2010 1:59 PM     Appended Document: new colon recall for 12/13 discussed with Dr Leone Payor and agree.

## 2011-01-12 ENCOUNTER — Other Ambulatory Visit: Payer: Self-pay | Admitting: Internal Medicine

## 2011-01-12 ENCOUNTER — Encounter (INDEPENDENT_AMBULATORY_CARE_PROVIDER_SITE_OTHER): Payer: Self-pay | Admitting: *Deleted

## 2011-01-12 ENCOUNTER — Other Ambulatory Visit: Payer: Self-pay

## 2011-01-12 DIAGNOSIS — E538 Deficiency of other specified B group vitamins: Secondary | ICD-10-CM

## 2011-01-27 NOTE — Consult Note (Signed)
Summary: [facility]  [facility]   Imported By: Lennie Odor 01/13/2011 14:24:53  _____________________________________________________________________  External Attachment:    Type:   Image     Comment:   External Document

## 2011-02-01 LAB — GLUCOSE, CAPILLARY: Glucose-Capillary: 100 mg/dL — ABNORMAL HIGH (ref 70–99)

## 2011-03-09 ENCOUNTER — Encounter (INDEPENDENT_AMBULATORY_CARE_PROVIDER_SITE_OTHER): Payer: Commercial Managed Care - PPO | Admitting: Obstetrics and Gynecology

## 2011-03-09 ENCOUNTER — Other Ambulatory Visit: Payer: Self-pay | Admitting: Obstetrics and Gynecology

## 2011-03-09 ENCOUNTER — Other Ambulatory Visit (HOSPITAL_COMMUNITY)
Admission: RE | Admit: 2011-03-09 | Discharge: 2011-03-09 | Disposition: A | Discharge status: Home / Self Care | Payer: 59 | Source: Ambulatory Visit | Attending: Obstetrics and Gynecology | Admitting: Obstetrics and Gynecology

## 2011-03-09 DIAGNOSIS — Z01419 Encounter for gynecological examination (general) (routine) without abnormal findings: Secondary | ICD-10-CM

## 2011-03-09 DIAGNOSIS — Z124 Encounter for screening for malignant neoplasm of cervix: Secondary | ICD-10-CM | POA: Insufficient documentation

## 2011-03-16 ENCOUNTER — Ambulatory Visit (INDEPENDENT_AMBULATORY_CARE_PROVIDER_SITE_OTHER): Payer: Commercial Managed Care - PPO | Admitting: Internal Medicine

## 2011-03-16 VITALS — BP 118/72 | HR 55 | Temp 97.1°F | Wt 127.0 lb

## 2011-03-16 DIAGNOSIS — R519 Headache, unspecified: Secondary | ICD-10-CM

## 2011-03-16 DIAGNOSIS — R51 Headache: Secondary | ICD-10-CM

## 2011-03-16 MED ORDER — PREDNISONE 20 MG PO TABS: 20.0000 mg | ORAL_TABLET | Freq: Every day | ORAL | Status: AC

## 2011-03-16 MED ORDER — VALACYCLOVIR HCL 1 G PO TABS: 1000.0000 mg | ORAL_TABLET | Freq: Three times a day (TID) | ORAL | Status: AC

## 2011-03-16 NOTE — Progress Notes (Signed)
  Subjective:    Patient ID: Valerie Phelps, female    DOB: 11-Sep-1946, 65 y.o.   MRN: [id number]  HPI Dr. Juanda Chance presents with a 48 hr h/o pain that started in the posterior pharynx left and then developed pain left parietal scalp, post-auricular region and left cervical area. No fever, chills, cough, odynophagia, tinnitis, drainage from the ear. No visible scalp lesions or skin lesions neck.   I have reviewed the patient's medical history in detail and updated the computerized patient record.    Review of Systems Review of Systems  Constitutional:  Negative for fever, chills, activity change and unexpected weight change.  HENT:  Negative for hearing loss,  congestion, neck stiffness and postnasal drip.   Eyes: Negative for pain, discharge and visual disturbance.  Respiratory: Negative for chest tightness and wheezing.   Cardiovascular: Negative for chest pain and palpitations.       [No decreased exercise tolerance Gastrointestinal: [No change in bowel habit. No bloating or gas. No reflux or indigestion Genitourinary: Negative for urgency, frequency, flank pain and difficulty urinating.  Musculoskeletal: Negative for myalgias, back pain, arthralgias and gait problem.  Neurological: Negative for dizziness, tremors, weakness and headaches.  Hematological: Negative for adenopathy.  Psychiatric/Behavioral: Negative for behavioral problems and dysphoric mood.       Objective:   Physical Exam WNWD white woman in no acute distress HEENT - no oral lesions, posterior pharynx clear, EAC left clear with nl TM. No tenderness to palpation over the TMJ or from posterior aspect. Neck - nl range of motion Skin - no lesions scalp, neck or face.       Assessment & Plan:  1. Pain - throat, scalp, ear - concern for possible zoster with pain prior to skin eruption. No evidence of infection: no fever, nl exam. No evidence of TMJ.  Plan - valtrex 1000mg  qd x 7           Prednisone 20mg  qd x 7    Follow-up if progressive symptoms.

## 2011-03-23 ENCOUNTER — Other Ambulatory Visit: Payer: Self-pay | Admitting: Internal Medicine

## 2011-03-23 DIAGNOSIS — R590 Localized enlarged lymph nodes: Secondary | ICD-10-CM

## 2011-04-01 ENCOUNTER — Ambulatory Visit (INDEPENDENT_AMBULATORY_CARE_PROVIDER_SITE_OTHER)
Admission: RE | Admit: 2011-04-01 | Discharge: 2011-04-01 | Disposition: A | Discharge status: Home / Self Care | Payer: Commercial Managed Care - PPO | Source: Ambulatory Visit | Attending: Internal Medicine | Admitting: Internal Medicine

## 2011-04-01 DIAGNOSIS — R590 Localized enlarged lymph nodes: Secondary | ICD-10-CM

## 2011-04-01 DIAGNOSIS — R599 Enlarged lymph nodes, unspecified: Secondary | ICD-10-CM

## 2011-04-01 MED ORDER — IOHEXOL 300 MG/ML  SOLN
80.0000 mL | Freq: Once | INTRAMUSCULAR | Status: AC | PRN
  Administered 2011-04-01: 80 mL via INTRAVENOUS

## 2011-04-03 ENCOUNTER — Encounter: Payer: Self-pay | Admitting: Internal Medicine

## 2011-08-19 ENCOUNTER — Other Ambulatory Visit: Payer: Self-pay | Admitting: Internal Medicine

## 2011-08-21 NOTE — Telephone Encounter (Signed)
Ok for refill x 5 

## 2011-10-20 ENCOUNTER — Other Ambulatory Visit (HOSPITAL_COMMUNITY): Payer: Self-pay | Admitting: Cardiology

## 2011-10-20 DIAGNOSIS — I5189 Other ill-defined heart diseases: Secondary | ICD-10-CM

## 2011-10-21 ENCOUNTER — Other Ambulatory Visit (HOSPITAL_COMMUNITY): Payer: Self-pay | Admitting: Cardiology

## 2011-10-21 ENCOUNTER — Ambulatory Visit (HOSPITAL_COMMUNITY): Payer: 59 | Attending: Cardiology | Admitting: Radiology

## 2011-10-21 ENCOUNTER — Other Ambulatory Visit (HOSPITAL_COMMUNITY): Payer: 59 | Admitting: Radiology

## 2011-10-21 ENCOUNTER — Ambulatory Visit (HOSPITAL_BASED_OUTPATIENT_CLINIC_OR_DEPARTMENT_OTHER): Payer: 59 | Admitting: Radiology

## 2011-10-21 DIAGNOSIS — I351 Nonrheumatic aortic (valve) insufficiency: Secondary | ICD-10-CM

## 2011-10-21 DIAGNOSIS — I519 Heart disease, unspecified: Secondary | ICD-10-CM | POA: Insufficient documentation

## 2011-10-21 DIAGNOSIS — R0602 Shortness of breath: Secondary | ICD-10-CM

## 2011-10-21 DIAGNOSIS — R0989 Other specified symptoms and signs involving the circulatory and respiratory systems: Secondary | ICD-10-CM | POA: Insufficient documentation

## 2011-10-21 DIAGNOSIS — R0609 Other forms of dyspnea: Secondary | ICD-10-CM | POA: Insufficient documentation

## 2011-10-21 DIAGNOSIS — I5189 Other ill-defined heart diseases: Secondary | ICD-10-CM

## 2011-10-21 DIAGNOSIS — I359 Nonrheumatic aortic valve disorder, unspecified: Secondary | ICD-10-CM

## 2011-10-25 ENCOUNTER — Encounter: Payer: Self-pay | Admitting: Cardiology

## 2011-10-25 ENCOUNTER — Other Ambulatory Visit (HOSPITAL_COMMUNITY): Payer: 59 | Admitting: Radiology

## 2011-10-25 DIAGNOSIS — I351 Nonrheumatic aortic (valve) insufficiency: Secondary | ICD-10-CM | POA: Insufficient documentation

## 2011-10-25 DIAGNOSIS — I5189 Other ill-defined heart diseases: Secondary | ICD-10-CM | POA: Insufficient documentation

## 2011-10-25 DIAGNOSIS — R9389 Abnormal findings on diagnostic imaging of other specified body structures: Secondary | ICD-10-CM | POA: Insufficient documentation

## 2011-10-25 DIAGNOSIS — R9430 Abnormal result of cardiovascular function study, unspecified: Secondary | ICD-10-CM | POA: Insufficient documentation

## 2011-10-31 ENCOUNTER — Encounter: Payer: Self-pay | Admitting: Cardiology

## 2011-10-31 DIAGNOSIS — I2510 Atherosclerotic heart disease of native coronary artery without angina pectoris: Secondary | ICD-10-CM | POA: Insufficient documentation

## 2011-10-31 DIAGNOSIS — I498 Other specified cardiac arrhythmias: Secondary | ICD-10-CM | POA: Insufficient documentation

## 2011-12-05 ENCOUNTER — Other Ambulatory Visit: Payer: Self-pay | Admitting: Internal Medicine

## 2011-12-06 NOTE — Telephone Encounter (Signed)
Done

## 2011-12-29 ENCOUNTER — Encounter: Payer: Self-pay | Admitting: Cardiology

## 2012-01-02 ENCOUNTER — Encounter: Payer: Self-pay | Admitting: Cardiology

## 2012-01-02 DIAGNOSIS — IMO0001 Reserved for inherently not codable concepts without codable children: Secondary | ICD-10-CM

## 2012-01-02 DIAGNOSIS — I2510 Atherosclerotic heart disease of native coronary artery without angina pectoris: Secondary | ICD-10-CM

## 2012-01-02 DIAGNOSIS — I5189 Other ill-defined heart diseases: Secondary | ICD-10-CM

## 2012-01-02 DIAGNOSIS — I351 Nonrheumatic aortic (valve) insufficiency: Secondary | ICD-10-CM

## 2012-01-02 DIAGNOSIS — I498 Other specified cardiac arrhythmias: Secondary | ICD-10-CM

## 2012-01-02 DIAGNOSIS — R9430 Abnormal result of cardiovascular function study, unspecified: Secondary | ICD-10-CM

## 2012-01-02 NOTE — Assessment & Plan Note (Signed)
The ejection fraction has been normal with all studies. Ejection fraction is 60% by echo, November, 2012

## 2012-01-02 NOTE — Assessment & Plan Note (Signed)
It is felt that the diastolic parameters are abnormal. The significance of this is unclear. There is no significant clinical effect. The patient had a stress echo November, 2012 revealing normal increase in LV function with exercise. There were no stress-induced wall motion abnormalities. There is no treatment recommended at this time

## 2012-01-02 NOTE — Assessment & Plan Note (Addendum)
The cardiac CT revealed a calcium score of 61 in November, 2011. There was a small nidus in the LAD with less than 30% stenosis and a small nidus in the RCA with less than 30% stenosis.,  Consideration could be given to the use of a statin.

## 2012-01-02 NOTE — Progress Notes (Addendum)
PLEASE NOTE:  The diagnoses listed at the top of this note are to be disregarded. They are incorrectly placed there by this electronic medical record and I am not able to remove them. The information to be conveyed in this note is all below.   There is a prior summary of cardiac findings in the system dated December 14, 2010. At that time I outlined prior echo findings and discussion with Dr. Herbie Baltimore of the [facility]. The patient was clinically stable. It was felt that a followup stress echo should be done at a later date. This stress echo was done along with a standard echo on October 21, 2011. Both reports are in the electronic medical record. The issues are discussed under the assessment section of this note.  The patient has never had a TEE. The patient has never had a cardiac MRI. It was felt that an MRI might be of academic interest, but it was felt that this study would not change the course of therapy when it was considered in 2011.  It is also to be remembered that the patient has had bilateral mastectomies with replacement implants. There was no chemotherapy. There was no radiation therapy.

## 2012-01-02 NOTE — Assessment & Plan Note (Addendum)
The patient often has atrial bigeminy with a slow resting heart rate. However she was able to increase her heart rate appropriately with the stress echo November, 2012

## 2012-01-02 NOTE — Assessment & Plan Note (Signed)
The patient had a cardiac CT angiogram in the fall of 2011. The study revealed that the aortic valve is tricuspid with partially fused commissure. This study along with other echoes have continued to show mild to moderate aortic insufficiency. The echo of November, 2012 again reveals mild to moderate aortic insufficiency. The left ventricle was not dilated. Aortic insufficiency will be followed over time. The patient can have a standard followup echo in November, 2013. The patient's aorta is of normal size. There was no evidence of coarctation of the aorta.

## 2012-03-08 ENCOUNTER — Other Ambulatory Visit: Payer: Self-pay | Admitting: *Deleted

## 2012-03-08 ENCOUNTER — Ambulatory Visit
Admission: RE | Admit: 2012-03-08 | Discharge: 2012-03-08 | Disposition: A | Discharge status: Home / Self Care | Payer: 59 | Source: Ambulatory Visit | Attending: Internal Medicine | Admitting: Internal Medicine

## 2012-03-08 DIAGNOSIS — W19XXXA Unspecified fall, initial encounter: Secondary | ICD-10-CM

## 2012-03-08 DIAGNOSIS — M25531 Pain in right wrist: Secondary | ICD-10-CM

## 2012-03-08 NOTE — Progress Notes (Signed)
Dr Juanda Chance called reporting fall & possible broken right wrist; Xray order placed at her request/SLS

## 2012-03-15 ENCOUNTER — Encounter: Payer: 59 | Admitting: Obstetrics and Gynecology

## 2012-03-27 ENCOUNTER — Encounter (HOSPITAL_BASED_OUTPATIENT_CLINIC_OR_DEPARTMENT_OTHER): Payer: Self-pay | Admitting: *Deleted

## 2012-03-27 NOTE — Progress Notes (Signed)
No labs needed To bring all meds and overnight bag in case she needs to stay 

## 2012-03-29 ENCOUNTER — Encounter: Payer: Self-pay | Admitting: Gynecology

## 2012-03-29 ENCOUNTER — Encounter (HOSPITAL_BASED_OUTPATIENT_CLINIC_OR_DEPARTMENT_OTHER): Payer: Self-pay | Admitting: Anesthesiology

## 2012-03-29 ENCOUNTER — Encounter (HOSPITAL_BASED_OUTPATIENT_CLINIC_OR_DEPARTMENT_OTHER)
Admission: RE | Disposition: A | Discharge status: Home / Self Care | Payer: Self-pay | Source: Ambulatory Visit | Attending: Plastic Surgery

## 2012-03-29 ENCOUNTER — Encounter (HOSPITAL_BASED_OUTPATIENT_CLINIC_OR_DEPARTMENT_OTHER): Payer: Self-pay | Admitting: *Deleted

## 2012-03-29 ENCOUNTER — Ambulatory Visit (HOSPITAL_BASED_OUTPATIENT_CLINIC_OR_DEPARTMENT_OTHER): Payer: 59 | Admitting: Anesthesiology

## 2012-03-29 ENCOUNTER — Ambulatory Visit (HOSPITAL_BASED_OUTPATIENT_CLINIC_OR_DEPARTMENT_OTHER)
Admission: RE | Admit: 2012-03-29 | Discharge: 2012-03-29 | Disposition: A | Discharge status: Home / Self Care | Payer: 59 | Source: Ambulatory Visit | Attending: Plastic Surgery | Admitting: Plastic Surgery

## 2012-03-29 ENCOUNTER — Encounter (HOSPITAL_BASED_OUTPATIENT_CLINIC_OR_DEPARTMENT_OTHER): Payer: Self-pay

## 2012-03-29 DIAGNOSIS — I2510 Atherosclerotic heart disease of native coronary artery without angina pectoris: Secondary | ICD-10-CM | POA: Insufficient documentation

## 2012-03-29 DIAGNOSIS — C50919 Malignant neoplasm of unspecified site of unspecified female breast: Secondary | ICD-10-CM | POA: Insufficient documentation

## 2012-03-29 DIAGNOSIS — Y834 Other reconstructive surgery as the cause of abnormal reaction of the patient, or of later complication, without mention of misadventure at the time of the procedure: Secondary | ICD-10-CM | POA: Insufficient documentation

## 2012-03-29 DIAGNOSIS — T8549XA Other mechanical complication of breast prosthesis and implant, initial encounter: Secondary | ICD-10-CM | POA: Insufficient documentation

## 2012-03-29 DIAGNOSIS — Z853 Personal history of malignant neoplasm of breast: Secondary | ICD-10-CM | POA: Insufficient documentation

## 2012-03-29 SURGERY — CAPSULECTOMY, BREAST, WITH REPLACEMENT OF IMPLANT
Anesthesia: General | Site: Breast | Laterality: Bilateral | Wound class: Clean

## 2012-03-29 MED ORDER — SUCCINYLCHOLINE CHLORIDE 20 MG/ML IJ SOLN
INTRAMUSCULAR | Status: DC | PRN
  Administered 2012-03-29: 100 mg via INTRAVENOUS

## 2012-03-29 MED ORDER — LACTATED RINGERS IV SOLN
INTRAVENOUS | Status: DC
  Administered 2012-03-29 (×2): via INTRAVENOUS

## 2012-03-29 MED ORDER — LIDOCAINE HCL (CARDIAC) 20 MG/ML IV SOLN
INTRAVENOUS | Status: DC | PRN
  Administered 2012-03-29: 80 mg via INTRAVENOUS

## 2012-03-29 MED ORDER — ONDANSETRON HCL 4 MG/2ML IJ SOLN: 4.0000 mg | Freq: Four times a day (QID) | INTRAMUSCULAR | Status: DC | PRN

## 2012-03-29 MED ORDER — MIDAZOLAM HCL 5 MG/5ML IJ SOLN
INTRAMUSCULAR | Status: DC | PRN
  Administered 2012-03-29: 1 mg via INTRAVENOUS

## 2012-03-29 MED ORDER — HYDROMORPHONE HCL PF 1 MG/ML IJ SOLN: 0.2500 mg | INTRAMUSCULAR | Status: DC | PRN

## 2012-03-29 MED ORDER — PHENYLEPHRINE HCL 10 MG/ML IJ SOLN
INTRAMUSCULAR | Status: DC | PRN
  Administered 2012-03-29: 40 ug via INTRAVENOUS

## 2012-03-29 MED ORDER — FENTANYL CITRATE 0.05 MG/ML IJ SOLN
INTRAMUSCULAR | Status: DC | PRN
  Administered 2012-03-29: 25 ug via INTRAVENOUS
  Administered 2012-03-29 (×2): 50 ug via INTRAVENOUS

## 2012-03-29 MED ORDER — NEOSTIGMINE METHYLSULFATE 1 MG/ML IJ SOLN
INTRAMUSCULAR | Status: DC | PRN
  Administered 2012-03-29: 2 mg via INTRAVENOUS

## 2012-03-29 MED ORDER — ROCURONIUM BROMIDE 100 MG/10ML IV SOLN
INTRAVENOUS | Status: DC | PRN
  Administered 2012-03-29: 25 mg via INTRAVENOUS
  Administered 2012-03-29: 20 mg via INTRAVENOUS

## 2012-03-29 MED ORDER — DEXAMETHASONE SODIUM PHOSPHATE 4 MG/ML IJ SOLN
INTRAMUSCULAR | Status: DC | PRN
  Administered 2012-03-29: 8 mg via INTRAVENOUS

## 2012-03-29 MED ORDER — CEFAZOLIN SODIUM 1-5 GM-% IV SOLN
1.0000 g | Freq: Once | INTRAVENOUS | Status: AC
  Administered 2012-03-29: 1 g via INTRAVENOUS

## 2012-03-29 MED ORDER — GLYCOPYRROLATE 0.2 MG/ML IJ SOLN
INTRAMUSCULAR | Status: DC | PRN
  Administered 2012-03-29: 0.4 mg via INTRAVENOUS

## 2012-03-29 MED ORDER — PHENYLEPHRINE HCL 10 MG/ML IJ SOLN
10.0000 mg | INTRAVENOUS | Status: DC | PRN
  Administered 2012-03-29: 50 ug/min via INTRAVENOUS

## 2012-03-29 MED ORDER — ONDANSETRON HCL 4 MG/2ML IJ SOLN
INTRAMUSCULAR | Status: DC | PRN
  Administered 2012-03-29 (×2): 4 mg via INTRAVENOUS

## 2012-03-29 MED ORDER — PROPOFOL 10 MG/ML IV EMUL
INTRAVENOUS | Status: DC | PRN
  Administered 2012-03-29: 160 mg via INTRAVENOUS
  Administered 2012-03-29: 20 mg via INTRAVENOUS

## 2012-03-29 SURGICAL SUPPLY — 45 items
BAG DECANTER FOR FLEXI CONT (MISCELLANEOUS) IMPLANT
BANDAGE ELASTIC 6 VELCRO ST LF (GAUZE/BANDAGES/DRESSINGS) ×2 IMPLANT
BINDER BREAST LRG (GAUZE/BANDAGES/DRESSINGS) IMPLANT
BINDER BREAST MEDIUM (GAUZE/BANDAGES/DRESSINGS) ×2 IMPLANT
BINDER BREAST XLRG (GAUZE/BANDAGES/DRESSINGS) IMPLANT
BINDER BREAST XXLRG (GAUZE/BANDAGES/DRESSINGS) IMPLANT
BLADE HEX COATED 2.75 (ELECTRODE) ×2 IMPLANT
BLADE SURG 15 STRL LF DISP TIS (BLADE) ×1 IMPLANT
BLADE SURG 15 STRL SS (BLADE) ×1
CANISTER SUCTION 1200CC (MISCELLANEOUS) ×2 IMPLANT
CLOTH BEACON ORANGE TIMEOUT ST (SAFETY) ×2 IMPLANT
COVER MAYO STAND STRL (DRAPES) ×2 IMPLANT
COVER TABLE BACK 60X90 (DRAPES) ×2 IMPLANT
DECANTER SPIKE VIAL GLASS SM (MISCELLANEOUS) ×2 IMPLANT
DRAIN CHANNEL 10M FLAT 3/4 FLT (DRAIN) IMPLANT
DRAPE LAPAROSCOPIC ABDOMINAL (DRAPES) ×2 IMPLANT
DRSG PAD ABDOMINAL 8X10 ST (GAUZE/BANDAGES/DRESSINGS) ×4 IMPLANT
ELECT BLADE 4.0 EZ CLEAN MEGAD (MISCELLANEOUS) ×2
ELECT REM PT RETURN 9FT ADLT (ELECTROSURGICAL) ×2
ELECTRODE BLDE 4.0 EZ CLN MEGD (MISCELLANEOUS) ×1 IMPLANT
ELECTRODE REM PT RTRN 9FT ADLT (ELECTROSURGICAL) ×1 IMPLANT
EVACUATOR SILICONE 100CC (DRAIN) IMPLANT
GAUZE XEROFORM 5X9 LF (GAUZE/BANDAGES/DRESSINGS) IMPLANT
GLOVE BIO SURGEON STRL SZ8 (GLOVE) ×2 IMPLANT
GLOVE BIOGEL PI IND STRL 8 (GLOVE) ×1 IMPLANT
GLOVE BIOGEL PI INDICATOR 8 (GLOVE) ×1
GOWN PREVENTION PLUS XLARGE (GOWN DISPOSABLE) ×4 IMPLANT
GOWN PREVENTION PLUS XXLARGE (GOWN DISPOSABLE) IMPLANT
IMPLANT SILICONE 190CC MOD (Breast) ×4 IMPLANT
NS IRRIG 1000ML POUR BTL (IV SOLUTION) ×2 IMPLANT
PACK BASIN DAY SURGERY FS (CUSTOM PROCEDURE TRAY) ×2 IMPLANT
PENCIL BUTTON HOLSTER BLD 10FT (ELECTRODE) ×2 IMPLANT
SPONGE GAUZE 4X4 12PLY (GAUZE/BANDAGES/DRESSINGS) ×2 IMPLANT
SPONGE LAP 18X18 X RAY DECT (DISPOSABLE) ×4 IMPLANT
STRIP CLOSURE SKIN 1/2X4 (GAUZE/BANDAGES/DRESSINGS) ×2 IMPLANT
SUT MERSILENE 6 0 P 1 (SUTURE) IMPLANT
SUT MNCRL AB 3-0 PS2 18 (SUTURE) ×4 IMPLANT
SUT MON AB 4-0 PC3 18 (SUTURE) ×4 IMPLANT
SUT PROLENE 3 0 PS 2 (SUTURE) IMPLANT
SUT VIC AB 3-0 FS2 27 (SUTURE) ×2 IMPLANT
SUT VICRYL 3-0 CR8 SH (SUTURE) ×2 IMPLANT
SYR BULB IRRIGATION 50ML (SYRINGE) ×2 IMPLANT
SYR CONTROL 10ML LL (SYRINGE) IMPLANT
TOWEL OR 17X24 6PK STRL BLUE (TOWEL DISPOSABLE) ×6 IMPLANT
YANKAUER SUCT BULB TIP NO VENT (SUCTIONS) ×2 IMPLANT

## 2012-03-29 NOTE — Transfer of Care (Signed)
Immediate Anesthesia Transfer of Care Note  Patient: Valerie Phelps  Procedure(s) Performed: Procedure(s) (LRB): BREAST CAPSULECTOMY WITH IMPLANT EXCHANGE (Bilateral)  Patient Location: PACU  Anesthesia Type: General  Level of Consciousness: sedated and responds to stimulation  Airway & Oxygen Therapy: Patient Spontanous Breathing and Patient connected to face mask oxygen  Post-op Assessment: Report given to PACU RN and Post -op Vital signs reviewed and stable  Post vital signs: Reviewed and stable  Complications: No apparent anesthesia complications

## 2012-03-29 NOTE — H&P (View-Only) (Signed)
No labs needed To bring all meds and overnight bag in case she needs to stay

## 2012-03-29 NOTE — Discharge Instructions (Signed)
   Post Anesthesia Home Care Instructions  Activity: Get plenty of rest for the remainder of the day. A responsible adult should stay with you for 24 hours following the procedure.  For the next 24 hours, DO NOT: -Drive a car -Advertising copywriter -Drink alcoholic beverages -Take any medication unless instructed by your physician -Make any legal decisions or sign important papers.  Meals: Start with liquid foods such as gelatin or soup. Progress to regular foods as tolerated. Avoid greasy, spicy, heavy foods. If nausea and/or vomiting occur, drink only clear liquids until the nausea and/or vomiting subsides. Call your physician if vomiting continues.  Special Instructions/Symptoms: Your throat may feel dry or sore from the anesthesia or the breathing tube placed in your throat during surgery. If this causes discomfort, gargle with warm salt water. The discomfort should disappear within 24 hours.     Call your surgeon if you experience:   1.  Fever over 101.0. 2.  Inability to urinate. 3.  Nausea and/or vomiting. 4.  Extreme swelling or bruising at the surgical site. 5.  Continued bleeding from the incision. 6.  Increased pain, redness or drainage from the incision. 7.  Problems related to your pain medication.    Increase ambulation as tolerated. May shower in 48 hours. Binder or sports bra for one week. Leave steristrips in place.Marland Kitchen

## 2012-03-29 NOTE — Interval H&P Note (Signed)
History and Physical Interval Note:  03/29/2012 11:25 AM  Valerie Phelps  has presented today for surgery, with the diagnosis of H/O BREAST; LMalignant neoplasm of breast   The various methods of treatment have been discussed with the patient and family. After consideration of risks, benefits and other options for treatment, the patient has consented to  Procedure(s) (LRB): REMOVAL BREAST IMPLANTS (Bilateral) as a surgical intervention .  The patients' history has been reviewed, patient examined, no change in status, stable for surgery.  I have reviewed the patients' chart and labs.  Questions were answered to the patient's satisfaction.     Chrystie Nose, Malorie Bigford

## 2012-03-29 NOTE — H&P (Signed)
Valerie Phelps is an 66 y.o. female.   Chief Complaint: bilateral capsular contracture and breast assemetry following multiple procedures for bilateral breast reconstruction for the diagnosis of right breast cancer. HPI: breast cancer diagnosis in 1995, followed by bilateral mastectomies and multiple reconstructive procedures, with above current findings.  Past Medical History  Diagnosis Date  . Breast cancer   . 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 Atrial bigeminy     Present at slow heart rate  . Diastolic dysfunction     Stress echo, normal, November, 2012  . Abnormal CT scan, chest     Question of abnormal lymph nodes in the chest, biopsy was -2011  . Ejection fraction     EF 60%, echo, November, 2012  . CAD (coronary artery disease)     Cardiac CT,09/2010, calcium score 61, , small nidus LAD and RCA, and a below the knee  . Right ventricle     Question of mild right ventricular enlargement  in 2011 /   right ventricular size seems normal  in November, 2012, there is no documented pulmonary hypertension.    Past Surgical History  Procedure Date  . Breast surgery 1995    rt and lt total mastectomies-reconstruction  . Breast reconstruction 1997    implants exchg  . Oophorectomy 1982    rt    No family history on file. Social History:  reports that she has never smoked. She does not have any smokeless tobacco history on file. She reports that she drinks alcohol. She reports that she does not use illicit drugs.  Allergies: No Known Allergies  Medications Prior to Admission  Medication Sig Dispense Refill  . ALPRAZolam (XANAX) 0.25 MG tablet TAKE 1 TABLET BY MOUTH AS NEEDED  50 tablet  5  . diclofenac (VOLTAREN) 75 MG EC tablet Take 75 mg by mouth as needed.        Marland Kitchen estradiol-norethindrone (ACTIVELLA) 1-0.5 MG per tablet TAKE 1 TABLET BY MOUTH ONCE DAILY  28 tablet  12    No results found for this or any previous visit (from  the past 48 hour(s)). No results found.  Review of Systems  Constitutional: Negative.   All other systems reviewed and are negative.    There were no vitals taken for this visit. Physical Exam  Vitals reviewed. Constitutional: She is oriented to person, place, and time. She appears well-developed and well-nourished.  HENT:  Head: Normocephalic.  Eyes: Conjunctivae are normal. Pupils are equal, round, and reactive to light.  Neck: Normal range of motion. No thyromegaly present.  Cardiovascular: Normal rate, regular rhythm and normal heart sounds.   Respiratory: Effort normal and breath sounds normal.  GI: Soft. Bowel sounds are normal.  Musculoskeletal: Normal range of motion.  Lymphadenopathy:    She has no cervical adenopathy.  Neurological: She is alert and oriented to person, place, and time.  Skin: Skin is warm and dry.  Psychiatric: She has a normal mood and affect.     Assessment/Plan  Patient has history if bilateral mastectomy following diagnosis of right breast cancer, followed by multiple reconstructive procedures. She had an increase in right beast size, which subsided, and current has a degree of bilateral capsular contrcture with breast asymetry. She is admited for bilateral implant removal, capsulotomies and implant replacement with 190 ml silicone gel prostheses.  Chrystie Nose, Dasan Hardman 03/29/2012, 11:25 AM

## 2012-03-29 NOTE — Op Note (Signed)
Op note dictated # [id number]

## 2012-03-29 NOTE — Anesthesia Preprocedure Evaluation (Signed)
Anesthesia Evaluation  Patient identified by MRN, date of birth, ID band Patient awake    Reviewed: Allergy & Precautions, H&P , NPO status , Patient's Chart, lab work & pertinent test results  Airway Mallampati: II  Neck ROM: full    Dental   Pulmonary          Cardiovascular + CAD + dysrhythmias     Neuro/Psych    GI/Hepatic   Endo/Other    Renal/GU      Musculoskeletal   Abdominal   Peds  Hematology   Anesthesia Other Findings   Reproductive/Obstetrics                           Anesthesia Physical Anesthesia Plan  ASA: III  Anesthesia Plan: General   Post-op Pain Management:    Induction: Intravenous  Airway Management Planned: Oral ETT  Additional Equipment:   Intra-op Plan:   Post-operative Plan: Extubation in OR  Informed Consent: I have reviewed the patients History and Physical, chart, labs and discussed the procedure including the risks, benefits and alternatives for the proposed anesthesia with the patient or authorized representative who has indicated his/her understanding and acceptance.     Plan Discussed with: CRNA and Surgeon  Anesthesia Plan Comments:         Anesthesia Quick Evaluation

## 2012-03-29 NOTE — Anesthesia Postprocedure Evaluation (Signed)
Anesthesia Post Note  Patient: Valerie Phelps  Procedure(s) Performed: Procedure(s) (LRB): BREAST CAPSULECTOMY WITH IMPLANT EXCHANGE (Bilateral)  Anesthesia type: General  Patient location: PACU  Post pain: Pain level controlled and Adequate analgesia  Post assessment: Post-op Vital signs reviewed, Patient's Cardiovascular Status Stable, Respiratory Function Stable, Patent Airway and Pain level controlled  Last Vitals:  Filed Vitals:   03/29/12 1400  BP: 116/58  Pulse: 59  Temp:   Resp: 14    Post vital signs: Reviewed and stable  Level of consciousness: awake, alert  and oriented  Complications: No apparent anesthesia complications

## 2012-03-29 NOTE — Anesthesia Procedure Notes (Signed)
Procedure Name: Intubation Performed by: Lance Coon Pre-anesthesia Checklist: Patient identified, Timeout performed, Emergency Drugs available, Suction available and Patient being monitored Patient Re-evaluated:Patient Re-evaluated prior to inductionOxygen Delivery Method: Circle system utilized Preoxygenation: Pre-oxygenation with 100% oxygen Intubation Type: IV induction Ventilation: Mask ventilation without difficulty Grade View: Grade I Tube type: Oral Tube size: 7.0 mm Number of attempts: 1 Airway Equipment and Method: Stylet Placement Confirmation: ETT inserted through vocal cords under direct vision,  breath sounds checked- equal and bilateral and positive ETCO2 Dental Injury: Teeth and Oropharynx as per pre-operative assessment

## 2012-03-30 LAB — POCT HEMOGLOBIN-HEMACUE: Hemoglobin: 12.9 g/dL (ref 12.0–15.0)

## 2012-03-30 NOTE — Op Note (Signed)
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  POSTOPERATIVE DIAGNOSIS:  Mechanical complication of left mammary implant.  SURGEON:  Pleas Patricia, MD  ANESTHESIA:  General endotracheal.  FINDINGS:  The patient had had a previous  left modified radical mastectomy for carcinoma of the breast and had a prophylactic mastectomy on the right and had multiple reconstructive procedures.  She had noted some swelling recently about the left breast, which has subsided, but had asymmetry and capsular contracture and had an unusual findings on MRI, which was not considered diagnostic of metastatic cancer, but has possibly ended up being something that could have been related to the mechanical complication of the left mammary prosthesis noted in surgery today.  PROCEDURE IN DETAIL:  The patient was brought to the operating room, given a general endotracheal anesthetic, prepped with Betadine, and draped about both breasts in the sterile fashion.  Initially, the right breast was approached by excising the previous transverse mastectomy scar.  Dissection continued down through the subcutaneous tissues. Electrocautery used to divide the pectoralis major muscle on the anterior capsule and the implant removed.  The capsule was felt posteriorly thin and it was elected to do an open capsulotomy, trying to improve the position of the implant.  The implant was removed and it was an intact Mentor Siltex covered gel-filled prosthesis.  The wound  was irrigated with normal saline.  There was noted to be good hemostasis. The implant replacement was a Mentor smooth-walled classic moderate profile gel-filled prosthesis, 190 mm.  At this point, the capsular muscles were closed and interrupted 3-0 Vicryl and the subcutaneous tissue is closed with interrupted 3-0 Monocryl and the skin running with a subcuticular 4-0 Monocryl.  A similar procedure was carried out on the left side.  The  capsule felt somewhat thickened and there was fluid and what appeared to be some free silicone present in the space.  The fluid appeared to be clear.  It was sent for culture and sensitivity, both anaerobic and aerobic.  At this point, the implant was removed and there appeared to be a hole in the implant.  Again, with fiberoptic illumination, an open capsulotomy was performed in all direction and a partial capsulectomy on the left side removing some of the thickened capsule.  It was elected not to remove the entire capsule because of its adherence to the chest wall and to the underlying pectoralis major muscle.  The wound was irrigated with normal saline.  There was noted to be good hemostasis.  It should be noted that the implant was submitted for pathological examination, as was the capsule.  A similar 190 mL Mentor smooth-walled gel-filled prosthesis was placed on the left side and looked to be reasonable in terms  of its position and compression compared to the opposite right side.  The muscle and subcutaneous tissues once again were closed in a similar fashion. Steri-Strips, 4 x 4s, ABD, and a light compression dressing were applied.  The patient tolerated the procedure well and was able to be discharged from the operating room to the recovery room in satisfactory condition.     Consuello Bossier., M.D.     HH/MEDQ  D:  03/29/2012  T:  03/30/2012  Job:  [id number]

## 2012-04-01 LAB — WOUND CULTURE: Culture: NO GROWTH

## 2012-04-03 ENCOUNTER — Encounter (HOSPITAL_BASED_OUTPATIENT_CLINIC_OR_DEPARTMENT_OTHER): Payer: Self-pay

## 2012-04-03 LAB — ANAEROBIC CULTURE

## 2012-04-05 ENCOUNTER — Ambulatory Visit (INDEPENDENT_AMBULATORY_CARE_PROVIDER_SITE_OTHER): Payer: 59 | Admitting: Obstetrics and Gynecology

## 2012-04-05 ENCOUNTER — Encounter: Payer: Self-pay | Admitting: Obstetrics and Gynecology

## 2012-04-05 VITALS — BP 110/74 | Ht 64.5 in | Wt 124.0 lb

## 2012-04-05 DIAGNOSIS — R102 Pelvic and perineal pain: Secondary | ICD-10-CM

## 2012-04-05 DIAGNOSIS — Z01419 Encounter for gynecological examination (general) (routine) without abnormal findings: Secondary | ICD-10-CM

## 2012-04-05 DIAGNOSIS — N949 Unspecified condition associated with female genital organs and menstrual cycle: Secondary | ICD-10-CM

## 2012-04-05 MED ORDER — ESTRADIOL 2 MG VA RING: 2.0000 mg | VAGINAL_RING | VAGINAL | Status: DC

## 2012-04-05 NOTE — Progress Notes (Signed)
Patient came back to see me a day for annual GYN exam. She is doing well without significant menopausal symptoms. She continues on Activella which she only has to do about every third day. She continues with the Estring vaginal ring which works  very well for atrophic vaginitis. She is having some lower abdominal discomfort. She is having no vaginal bleeding. She has 2 family members who have breast cancer before the age of 31 and the patient has a personal history of breast cancer before the age of 25. She does her bone densities through her PCP and she has low bone mass without an elevated fracture risk. She just had her breast implants replaced due to leakage. Her  Physical examination: HEENT within normal limits. Neck: Thyroid not large. No masses. Supraclavicular nodes: not enlarged. Breasts: Examined in both sitting and lying  position. She is status post bilateral mastectomy with implants. No masses palpated. Abdomen: Soft no guarding rebound or masses or hernia. Pelvic: External: Within normal limits. BUS: Within normal limits. Vaginal:within normal limits. Good estrogen effect. No evidence of cystocele rectocele or enterocele. Cervix: clean. Uterus: Normal size and shape. Adnexa: No masses. Rectovaginal exam: Confirmatory and negative. Extremities: Within normal limits.  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  Plan: We calculated her risk of being positive for BRCA1 or BRCA2 to be either a 30% or 36% pending wheather  you considered her Jewish. She is [place] with only one family member who was Jewish. She will get counseling for testing at the cancer center at [facility]. She will continue Estring vaginal ring. She will continue her Activella. We discussed transdermal patch but she  elected to continue oral estrogen.

## 2012-04-06 LAB — URINALYSIS W MICROSCOPIC + REFLEX CULTURE
Bacteria, UA: NONE SEEN
Casts: NONE SEEN
Crystals: NONE SEEN
Ketones, ur: NEGATIVE mg/dL
Leukocytes, UA: NEGATIVE
Nitrite: NEGATIVE
Specific Gravity, Urine: 1.022 (ref 1.005–1.030)
Urobilinogen, UA: 1 mg/dL (ref 0.0–1.0)
pH: 6.5 (ref 5.0–8.0)

## 2012-04-09 ENCOUNTER — Other Ambulatory Visit: Payer: Self-pay | Admitting: Internal Medicine

## 2012-04-12 ENCOUNTER — Ambulatory Visit (INDEPENDENT_AMBULATORY_CARE_PROVIDER_SITE_OTHER): Payer: 59 | Admitting: Obstetrics and Gynecology

## 2012-04-12 ENCOUNTER — Ambulatory Visit (INDEPENDENT_AMBULATORY_CARE_PROVIDER_SITE_OTHER): Payer: 59

## 2012-04-12 ENCOUNTER — Other Ambulatory Visit: Payer: Self-pay | Admitting: Obstetrics and Gynecology

## 2012-04-12 DIAGNOSIS — R102 Pelvic and perineal pain: Secondary | ICD-10-CM

## 2012-04-12 DIAGNOSIS — T8339XA Other mechanical complication of intrauterine contraceptive device, initial encounter: Secondary | ICD-10-CM

## 2012-04-12 DIAGNOSIS — N83339 Acquired atrophy of ovary and fallopian tube, unspecified side: Secondary | ICD-10-CM

## 2012-04-12 DIAGNOSIS — N949 Unspecified condition associated with female genital organs and menstrual cycle: Secondary | ICD-10-CM

## 2012-04-12 NOTE — Progress Notes (Signed)
Patient came back today for an ultrasound due to pelvic pain. The patient's uterus is anteverted with a homogeneous echo pattern. Her endometrial echo is thin at 1.3 mm. Her IUD is in the normal position in the endometrial cavity. Her right ovary is normal with a tiny microcalcification. Her left ovary is likewise normal. There are no adnexal masses. Her cul-de-sac is free of fluid.  Assessment: Pelvic pain  Plan: Patient reassured that there is no GYN pathology.

## 2012-09-06 ENCOUNTER — Other Ambulatory Visit (INDEPENDENT_AMBULATORY_CARE_PROVIDER_SITE_OTHER): Payer: 59

## 2012-09-06 ENCOUNTER — Other Ambulatory Visit: Payer: Self-pay | Admitting: Internal Medicine

## 2012-09-06 DIAGNOSIS — Z0000 Encounter for general adult medical examination without abnormal findings: Secondary | ICD-10-CM

## 2012-09-06 LAB — CBC WITH DIFFERENTIAL/PLATELET
Basophils Absolute: 0 10*3/uL (ref 0.0–0.1)
Lymphocytes Relative: 25.3 % (ref 12.0–46.0)
Lymphs Abs: 1.2 10*3/uL (ref 0.7–4.0)
Monocytes Relative: 10.8 % (ref 3.0–12.0)
Platelets: 201 10*3/uL (ref 150.0–400.0)
RDW: 13.6 % (ref 11.5–14.6)

## 2012-09-12 ENCOUNTER — Ambulatory Visit: Payer: 59

## 2012-09-13 ENCOUNTER — Ambulatory Visit (INDEPENDENT_AMBULATORY_CARE_PROVIDER_SITE_OTHER): Payer: 59 | Admitting: Internal Medicine

## 2012-09-13 DIAGNOSIS — Z136 Encounter for screening for cardiovascular disorders: Secondary | ICD-10-CM

## 2012-09-16 NOTE — Progress Notes (Signed)
  Subjective:    Patient ID: Valerie Phelps, female    DOB: [birth year], 66 y.o.   MRN: [id number]  HPI Nurse visit for EKG preop. She has no complaints.   Review of Systems     Objective:   Physical Exam        Assessment & Plan:  Pre-op EKG

## 2012-11-07 ENCOUNTER — Other Ambulatory Visit: Payer: Self-pay | Admitting: Internal Medicine

## 2012-11-29 ENCOUNTER — Ambulatory Visit (AMBULATORY_SURGERY_CENTER): Payer: 59 | Admitting: Internal Medicine

## 2012-11-29 ENCOUNTER — Encounter: Payer: Self-pay | Admitting: Internal Medicine

## 2012-11-29 VITALS — BP 121/57 | HR 62 | Temp 96.9°F | Resp 26 | Ht 64.5 in | Wt 124.0 lb

## 2012-11-29 DIAGNOSIS — K5730 Diverticulosis of large intestine without perforation or abscess without bleeding: Secondary | ICD-10-CM

## 2012-11-29 DIAGNOSIS — Z1211 Encounter for screening for malignant neoplasm of colon: Secondary | ICD-10-CM

## 2012-11-29 DIAGNOSIS — Z860100 Personal history of colon polyps, unspecified: Secondary | ICD-10-CM

## 2012-11-29 HISTORY — DX: Personal history of colonic polyps: Z86.010

## 2012-11-29 HISTORY — DX: Personal history of colon polyps, unspecified: Z86.0100

## 2012-11-29 MED ORDER — SODIUM CHLORIDE 0.9 % IV SOLN: 500.0000 mL | INTRAVENOUS | Status: DC

## 2012-11-29 NOTE — Progress Notes (Signed)
Lidocaine-40mg IV prior to Propofol InductionPropofol given over incremental dosages 

## 2012-11-29 NOTE — Op Note (Addendum)
[facility] [address] [place], [address]   COLONOSCOPY PROCEDURE REPORT  PATIENT: Valerie Phelps, Valerie Phelps  MR#: [id number] BIRTHDATE: 11-19-1946 , 66  yrs. old GENDER: Female ENDOSCOPIST: Iva Boop, MD, [facility] PROCEDURE DATE:  11/29/2012 PROCEDURE:   Colonoscopy, diagnostic ASA CLASS:   Class II INDICATIONS:Screening and surveillance,personal history of colonic polyps. MEDICATIONS: propofol (Diprivan) 250mg  IV, MAC sedation, administered by CRNA, and These medications were titrated to patient response per physician's verbal order  DESCRIPTION OF PROCEDURE:   After the risks benefits and alternatives of the procedure were thoroughly explained, informed consent was obtained.  A digital rectal exam revealed no abnormalities of the rectum.   The LB CF-[id number]  endoscope was introduced through the anus and advanced to the cecum, which was identified by both the appendix and ileocecal valve. No adverse events experienced.   The quality of the prep was excellent, using MoviPrep  The instrument was then slowly withdrawn as the colon was fully examined.      COLON FINDINGS: There was mild scattered diverticulosis noted in the right colon.   The colon mucosa of cecum, ascending, transverse, descending, sigmoid colon and rectum was otherwise normal. Retroflexed views in right colon and rectum revealed no abnormalities. The time to cecum=4 minutes 29 seconds.  Withdrawal time=17 minutes 25 seconds.  The scope was withdrawn and the procedure completed. COMPLICATIONS: There were no complications.  ENDOSCOPIC IMPRESSION: 1.   There was mild diverticulosis noted in the right colon 2.   The colon mucosa was otherwise normal, excellent prep in patient with prior 1 cm hyperplastic polyp splenic flexure (2006)  RECOMMENDATIONS: Repeat colonoscopy 10 years.   eSigned:  Iva Boop, MD, [facility] 11/30/2012 5:18 PM Revised: 11/30/2012 5:18 PM  cc: The  Patient

## 2012-11-29 NOTE — Patient Instructions (Addendum)
No polyps today! You do have mild diverticulosis in the right colon as before. Prep was excellent.  Next routine colonoscopy in 10 years (2024).  Thank you for choosing me and [facility].  Iva Boop, MD, FACG   YOU HAD AN ENDOSCOPIC PROCEDURE TODAY AT THE [facility]: Refer to the procedure report that was given to you for any specific questions about what was found during the examination.  If the procedure report does not answer your questions, please call your gastroenterologist to clarify.  If you requested that your care partner not be given the details of your procedure findings, then the procedure report has been included in a sealed envelope for you to review at your convenience later.  YOU SHOULD EXPECT: Some feelings of bloating in the abdomen. Passage of more gas than usual.  Walking can help get rid of the air that was put into your GI tract during the procedure and reduce the bloating. If you had a lower endoscopy (such as a colonoscopy or flexible sigmoidoscopy) you may notice spotting of blood in your stool or on the toilet paper. If you underwent a bowel prep for your procedure, then you may not have a normal bowel movement for a few days.  DIET: Your first meal following the procedure should be a light meal and then it is ok to progress to your normal diet.  A half-sandwich or bowl of soup is an example of a good first meal.  Heavy or fried foods are harder to digest and may make you feel nauseous or bloated.  Likewise meals heavy in dairy and vegetables can cause extra gas to form and this can also increase the bloating.  Drink plenty of fluids but you should avoid alcoholic beverages for 24 hours.  ACTIVITY: Your care partner should take you home directly after the procedure.  You should plan to take it easy, moving slowly for the rest of the day.  You can resume normal activity the day after the procedure however you should NOT DRIVE or use heavy  machinery for 24 hours (because of the sedation medicines used during the test).    SYMPTOMS TO REPORT IMMEDIATELY: A gastroenterologist can be reached at any hour.  During normal business hours, 8:30 AM to 5:00 PM Monday through Friday, call [phone].  After hours and on weekends, please call the GI answering service at [phone] who will take a message and have the physician on call contact you.   Following lower endoscopy (colonoscopy or flexible sigmoidoscopy):  Excessive amounts of blood in the stool  Significant tenderness or worsening of abdominal pains  Swelling of the abdomen that is new, acute  Fever of 100F or higher ols  FOLLOW UP: If any biopsies were taken you will be contacted by phone or by letter within the next 1-3 weeks.  Call your gastroenterologist if you have not heard about the biopsies in 3 weeks.  Our staff will call the home number listed on your records the next business day following your procedure to check on you and address any questions or concerns that you may have at that time regarding the information given to you following your procedure. This is a courtesy call and so if there is no answer at the home number and we have not heard from you through the emergency physician on call, we will assume that you have returned to your regular daily activities without incident.  SIGNATURES/CONFIDENTIALITY: You and/or your care partner  have signed paperwork which will be entered into your electronic medical record.  These signatures attest to the fact that that the information above on your After Visit Summary has been reviewed and is understood.  Full responsibility of the confidentiality of this discharge information lies with you and/or your care-partner.    Diverticulosis information given.  Next colonoscopy 10 years-2024

## 2012-11-29 NOTE — Progress Notes (Signed)
Patient did not experience any of the following events: a burn prior to discharge; a fall within the facility; wrong site/side/patient/procedure/implant event; or a hospital transfer or hospital admission upon discharge from the facility. (G8907) Patient did not have preoperative order for IV antibiotic SSI prophylaxis. (G8918)  

## 2012-11-30 ENCOUNTER — Telehealth: Payer: Self-pay

## 2012-11-30 NOTE — Telephone Encounter (Signed)
  Follow up Call-  Call back number 11/29/2012  Post procedure Call Back phone  # Working in [employer] today  Permission to leave phone message Yes     Patient questions:  Do you have a fever, pain , or abdominal swelling? no Pain Score  0 *  Have you tolerated food without any problems? yes  Have you been able to return to your normal activities? yes  Do you have any questions about your discharge instructions: Diet   no Medications  no Follow up visit  no  Do you have questions or concerns about your Care? no  Actions: * If pain score is 4 or above: No action needed, pain <4.

## 2013-02-18 ENCOUNTER — Other Ambulatory Visit: Payer: Self-pay

## 2013-02-18 MED ORDER — ESTRADIOL-NORETHINDRONE ACET 1-0.5 MG PO TABS: ORAL_TABLET | ORAL | Status: DC

## 2013-02-18 MED ORDER — ALPRAZOLAM 0.25 MG PO TABS: ORAL_TABLET | ORAL | Status: DC

## 2013-02-19 NOTE — Telephone Encounter (Signed)
Alprazolam called to pharmacy  

## 2013-02-28 ENCOUNTER — Ambulatory Visit (INDEPENDENT_AMBULATORY_CARE_PROVIDER_SITE_OTHER): Payer: 59 | Admitting: Internal Medicine

## 2013-02-28 ENCOUNTER — Encounter: Payer: Self-pay | Admitting: Internal Medicine

## 2013-02-28 VITALS — BP 130/86 | HR 49 | Temp 97.5°F | Resp 12 | Ht 65.0 in | Wt 127.0 lb

## 2013-02-28 DIAGNOSIS — Z0000 Encounter for general adult medical examination without abnormal findings: Secondary | ICD-10-CM

## 2013-02-28 DIAGNOSIS — M7710 Lateral epicondylitis, unspecified elbow: Secondary | ICD-10-CM

## 2013-02-28 DIAGNOSIS — M7711 Lateral epicondylitis, right elbow: Secondary | ICD-10-CM

## 2013-02-28 MED ORDER — TRAMADOL HCL 50 MG PO TABS: 50.0000 mg | ORAL_TABLET | Freq: Three times a day (TID) | ORAL | Status: DC | PRN

## 2013-02-28 NOTE — Progress Notes (Signed)
Subjective:    Patient ID: Valerie Phelps, female    DOB: 18-Jun-1946, 67 y.o.   MRN: [id number]  HPI Dr. Juanda Chance - has medial epicondyl pain left arm which she attributes to overuse/weight work. Pain is constant. Will try steroid.  Tramadol for prn use for MSK related pain.  Discussed health maintenance: she has had gyn exam in the past 12 months per Dr. Eda Paschal which was normal. She does have a e-string which is replaced every 3 months and is also taking oral medication. Per last pelvic u/s she has an IUD in place in the endometrium.   Past Medical History  Diagnosis Date  . 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 Atrial bigeminy     Present at slow heart rate  . Diastolic dysfunction     Stress echo, normal, November, 2012  . Abnormal CT scan, chest     Question of abnormal lymph nodes in the chest, biopsy was -2011  . Ejection fraction     EF 60%, echo, November, 2012  . CAD (coronary artery disease)     Cardiac CT,09/2010, calcium score 61, , small nidus LAD and RCA, and a below the knee  . Right ventricle     Question of mild right ventricular enlargement  in 2011 /   right ventricular size seems normal  in November, 2012, there is no documented pulmonary hypertension.  . Breast cancer     DCIS  . Personal history of colonic polyps-1 cm hyperplastic splenic flexure 11/29/2012   Past Surgical History  Procedure Laterality Date  . Oophorectomy  1982    rt  . Mouth surgery    . Lymph node biopsy    . Varicose vein injections    . Breast surgery  1995    rt and lt total mastectomies-reconstruction  . Breast reconstruction  [phone]    implants exchanged  . Facial cosmetic surgery    . Colonoscopy     Family History  Problem Relation Age of Onset  . Prostate cancer Father   . Breast cancer Maternal Aunt     Age 26  . Breast cancer Paternal Grandmother     Age 65's  . Colon cancer Paternal Grandfather    History   Social  History  . Marital Status: Married    Spouse Name: Bruce    Number of Children: 3  . 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 Smoking status: Never Smoker   . Smokeless tobacco: Never Used  . Alcohol Use: 2.5 oz/week    5 drink(s) per week     Comment: occ  . Drug Use: No  . Sexually Active: Yes -- Female partner(s)    Birth Control/ Protection: Post-menopausal   Other Topics Concern  . Not on file   Social History Narrative   [employer], [employer], [employer]. Married - '71- 7 years/divorced; Married '86. 1 son - '85; 2 step-children. work - [employer]. No history of abuse. ACP/end of life - full resiscitation, reasonable duration mechanical ventilatory support, no heroic/futile measures. HCPOA - spouse       Review of Systems System review is negative for any constitutional, cardiac, pulmonary, GI or neuro symptoms or complaints other than as described in the HPI.     Objective:   Physical Exam Filed Vitals:  02/28/13 1419  BP: 130/86  Pulse: 49  Temp: 97.5 F (36.4 C)  Resp: 12   Wt Readings from Last 3 Encounters:  02/28/13 127 lb (57.607 kg)  11/29/12 124 lb (56.246 kg)  04/05/12 124 lb (56.246 kg)   Gen'l - WNWD athletic appearing woman looking younger than her stated age HEENT- C&S clear Cor-2+ radial pulse, RRR Pulm - normal respirations MSK - nl ROM all small, medium and large joints. Tender to palpation at lateral epicondyle right. Tender with pronation against resistance.  Procedure - depomedrol injection for epicondylitis right UE, at lateral aspect  Indication - pain Consent - informed verbal consent given risks of bleeding and infection. Prep - skin cleansed with betadiene then alcohol Medication - 40mg  depomedrol with 0.5 cc 2% xylocain w/ epi  Procedure: in a sterile fashion the right  Lateral  epicondyle infused with  depomedrol. Patient tolerated procedure well. No complications ensued.         Assessment & Plan:  Epicondylitis - right lateral. Most likely from overuse exacerbated by working out. No loss of function.  Plan Steroid injection performed with good immediate relief  Advised that xylocain will wear off in 1-2 hrs and depo will kick-in after 4-6 hours.

## 2013-02-28 NOTE — Patient Instructions (Addendum)
Medial epicondylitis - glad the shot helped  OK for the tramadol. Let me know if it doesn't do the job  Health maintenance - no need for PAP smears at this time of life. A vulvo-vaginal exam every 3-5 years.   Shingles vaccine today.  Thanks for coming to see me.    Medial Epicondylitis (Golfer's Elbow) with Rehab Medial epicondylitis involves inflammation and pain around the inner (medial) portion of the elbow. This pain is caused by inflammation of the tendons in the forearm that flex (bring down) the wrist. Medial epicondylitis is also called golfer's elbow, because it is common among golfers. However, it may occur in any individual who flexes the wrist regularly. If medial epicondylitis is left untreated, it may become a chronic problem. SYMPTOMS   Pain, tenderness, or inflammation over the inner (medial) side of the elbow.  Pain or weakness with gripping activities.  Pain that increases with wrist twisting motions (using a screwdriver, playing golf, bowling). CAUSES  Medial epicondylitis is caused by inflammation of the tendons that flex the wrist. Causes of injury may include:  Chronic, repetitive stress and strain to the tendons that run from the wrist and forearm to the elbow.  Sudden strain on the forearm, including wrist snap when serving balls with racquet sports, or throwing a baseball. RISK INCREASES WITH:  Sports or occupations that require repetitive and/or strenuous forearm and wrist movements (pitching a baseball, golfing, carpentry).  Poor wrist and forearm strength and flexibility.  Failure to warm up properly before activity.  Resuming activity before healing, rehabilitation, and conditioning are complete. PREVENTION   Warm up and stretch properly before activity.  Maintain physical fitness:  Strength, flexibility, and endurance.  Cardiovascular fitness.  Wear and use properly fitted equipment.  Learn and use proper technique and have a coach correct  improper technique.  Wear a tennis elbow (counterforce) brace. PROGNOSIS  The course of this condition depends on the degree of the injury. If treated properly, acute cases (symptoms lasting less than 4 weeks) are often resolved in 2 to 6 weeks. Chronic (longer lasting cases) often resolve in 3 to 6 months, but may require physical therapy. RELATED COMPLICATIONS   Frequently recurring symptoms, resulting in a chronic problem. Properly treating the problem the first time decreases frequency of recurrence.  Chronic inflammation, scarring, and partial tendon tear, requiring surgery.  Delayed healing or resolution of symptoms. TREATMENT  Treatment first involves the use of ice and medicine, to reduce pain and inflammation. Strengthening and stretching exercises may reduce discomfort, if performed regularly. These exercises may be performed at home, if the condition is an acute injury. Chronic cases may require a referral to a physical therapist for evaluation and treatment. Your caregiver may advise a corticosteroid injection to help reduce inflammation. Rarely, surgery is needed. MEDICATION  If pain medicine is needed, nonsteroidal anti-inflammatory medicines (aspirin and ibuprofen), or other minor pain relievers (acetaminophen), are often advised.  Do not take pain medicine for 7 days before surgery.  Prescription pain relievers may be given, if your caregiver thinks they are needed. Use only as directed and only as much as you need.  Corticosteroid injections may be recommended. These injections should be reserved only for the most severe cases, because they can only be given a certain number of times. HEAT AND COLD  Cold treatment (icing) should be applied for 10 to 15 minutes every 2 to 3 hours for inflammation and pain, and immediately after activity that aggravates your symptoms. Use ice  packs or an ice massage.  Heat treatment may be used before performing stretching and strengthening  activities prescribed by your caregiver, physical therapist, or athletic trainer. Use a heat pack or a warm water soak. SEEK MEDICAL CARE IF: Symptoms get worse or do not improve in 2 weeks, despite treatment. EXERCISES  RANGE OF MOTION (ROM) AND STRETCHING EXERCISES - Epicondylitis, Medial (Golfer's Elbow) These exercises may help you when beginning to rehabilitate your injury. Your symptoms may go away with or without further involvement from your physician, physical therapist or athletic trainer. While completing these exercises, remember:   Restoring tissue flexibility helps normal motion to return to the joints. This allows healthier, less painful movement and activity.  An effective stretch should be held for at least 30 seconds.  A stretch should never be painful. You should only feel a gentle lengthening or release in the stretched tissue. RANGE OF MOTION  Wrist Flexion, Active-Assisted  Extend your right / left elbow with your fingers pointing down.*  Gently pull the back of your hand towards you, until you feel a gentle stretch on the top of your forearm.  Hold this position for __________ seconds. Repeat __________ times. Complete this exercise __________ times per day.  *If directed by your physician, physical therapist or athletic trainer, complete this stretch with your elbow bent, rather than extended. RANGE OF MOTION  Wrist Extension, Active-Assisted  Extend your right / left elbow and turn your palm upwards.*  Gently pull your palm and fingertips back, so your wrist extends and your fingers point more toward the ground.  You should feel a gentle stretch on the inside of your forearm.  Hold this position for __________ seconds. Repeat __________ times. Complete this exercise __________ times per day. *If directed by your physician, physical therapist or athletic trainer, complete this stretch with your elbow bent, rather than extended. STRETCH  Wrist Extension    Place your right / left fingertips on a tabletop leaving your elbow slightly bent. Your fingers should point backwards.  Gently press your fingers and palm down onto the table, by straightening your elbow. You should feel a stretch on the inside of your forearm.  Hold this position for __________ seconds. Repeat __________ times. Complete this stretch __________ times per day.  STRENGTHENING EXERCISES - Epicondylitis, Medial (Golfer's Elbow) These exercises may help you when beginning to rehabilitate your injury. They may resolve your symptoms with or without further involvement from your physician, physical therapist or athletic trainer. While completing these exercises, remember:   Muscles can gain both the endurance and the strength needed for everyday activities through controlled exercises.  Complete these exercises as instructed by your physician, physical therapist or athletic trainer. Increase the resistance and repetitions only as guided.  You may experience muscle soreness or fatigue, but the pain or discomfort you are trying to eliminate should never worsen during these exercises. If this pain does get worse, stop and make sure you are following the directions exactly. If the pain is still present after adjustments, discontinue the exercise until you can discuss the trouble with your caregiver. STRENGTH Wrist Flexors  Sit with your right / left forearm palm-up, and fully supported on a table or countertop. Your elbow should be resting below the height of your shoulder. Allow your wrist to extend over the edge of the surface.  Loosely holding a __________ weight, or a piece of rubber exercise band or tubing, slowly curl your hand up toward your forearm.  Hold this  position for __________ seconds. Slowly lower the wrist back to the starting position in a controlled manner. Repeat __________ times. Complete this exercise __________ times per day.  STRENGTH  Wrist Extensors  Sit  with your right / left forearm palm-down and fully supported. Your elbow should be resting below the height of your shoulder. Allow your wrist to extend over the edge of the surface.  Loosely holding a __________ weight, or a piece of rubber exercise band or tubing, slowly curl your hand up toward your forearm.  Hold this position for __________ seconds. Slowly lower the wrist back to the starting position in a controlled manner. Repeat __________ times. Complete this exercise __________ times per day.  STRENGTH - Ulnar Deviators  Stand with a ____________________ weight in your right / left hand, or sit while holding a rubber exercise band or tubing, with your healthy arm supported on a table or countertop.  Move your wrist so that your pinkie travels toward your forearm and your thumb moves away from your forearm.  Hold this position for __________ seconds and then slowly lower the wrist back to the starting position. Repeat __________ times. Complete this exercise __________ times per day STRENGTH - Grip   Grasp a tennis ball, a dense sponge, or a large, rolled sock in your hand.  Squeeze as hard as you can, without increasing any pain.  Hold this position for __________ seconds. Release your grip slowly. Repeat __________ times. Complete this exercise __________ times per day.  STRENGTH  Forearm Supinators   Sit with your right / left forearm supported on a table, keeping your elbow below shoulder height. Rest your hand over the edge, palm down.  Gently grip a hammer or a soup ladle.  Without moving your elbow, slowly turn your palm and hand upward to a "thumbs-up" position.  Hold this position for __________ seconds. Slowly return to the starting position. Repeat __________ times. Complete this exercise __________ times per day.  STRENGTH  Forearm Pronators  Sit with your right / left forearm supported on a table, keeping your elbow below shoulder height. Rest your hand over  the edge, palm up.  Gently grip a hammer or a soup ladle.  Without moving your elbow, slowly turn your palm and hand upward to a "thumbs-up" position.  Hold this position for __________ seconds. Slowly return to the starting position. Repeat __________ times. Complete this exercise __________ times per day.  Document Released: 11/07/2005 Document Revised: 01/30/2012 Document Reviewed: 02/19/2009 [facility] Patient Information 2013 [place], [place].

## 2013-03-01 ENCOUNTER — Encounter: Payer: Self-pay | Admitting: Internal Medicine

## 2013-03-04 ENCOUNTER — Encounter: Payer: Self-pay | Admitting: Internal Medicine

## 2013-03-04 DIAGNOSIS — Z0000 Encounter for general adult medical examination without abnormal findings: Secondary | ICD-10-CM | POA: Insufficient documentation

## 2013-03-04 MED ORDER — METHYLPREDNISOLONE ACETATE 80 MG/ML IJ SUSP: 40.0000 mg | Freq: Once | INTRAMUSCULAR | Status: DC

## 2013-03-04 NOTE — Assessment & Plan Note (Signed)
Interval medical history is OK - no major illness, injury or surgery except for implant revision. Gyn - last exam in May. With a normal PAP history in a long term relationship further PAPs not indicated. 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 Last Pelvic U/S with IUD in place - may want to consider removal. She is current with colorectal cancer screening and is current with follow-up breast imaging

## 2013-03-11 ENCOUNTER — Encounter: Payer: 59 | Admitting: Internal Medicine

## 2013-03-15 ENCOUNTER — Encounter: Payer: Self-pay | Admitting: Internal Medicine

## 2013-03-21 ENCOUNTER — Telehealth: Payer: Self-pay | Admitting: Internal Medicine

## 2013-03-21 NOTE — Telephone Encounter (Signed)
Just and FYI for Dr. Debby Bud, please make these notes in patients chart per Mychart message  *I have been advised by my Gyn Dr Oletha Blend at my last Pap smear appointment in 02/2012 that I no longer need Pap smears. I have discussed this with Dr Debby Bud during my recent visit. Please take me off the "recall" list. 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 I have been advised by the surgeon as well as my oncologist that follow up mammograms are not indicated. My last mammogram was in 1995. I had 2 PETs scans in 2012- no recurrent cancer . Please take me off the "recall " schedule

## 2013-05-28 ENCOUNTER — Other Ambulatory Visit: Payer: Self-pay | Admitting: Gastroenterology

## 2013-05-28 MED ORDER — PREDNISONE 10 MG PO TABS: ORAL_TABLET | ORAL | Status: DC

## 2013-05-28 NOTE — Progress Notes (Signed)
Per Dr Arlyce Dice, please send Prednisone 10 mg tablets, #50, Take as directed for patient. Rx sent to [facility].

## 2013-06-26 ENCOUNTER — Other Ambulatory Visit: Payer: Self-pay

## 2013-07-11 ENCOUNTER — Other Ambulatory Visit (HOSPITAL_COMMUNITY): Payer: Self-pay | Admitting: Orthopedic Surgery

## 2013-07-11 DIAGNOSIS — M25512 Pain in left shoulder: Secondary | ICD-10-CM

## 2013-07-17 ENCOUNTER — Ambulatory Visit (HOSPITAL_COMMUNITY)
Admission: RE | Admit: 2013-07-17 | Discharge: 2013-07-17 | Disposition: A | Discharge status: Home / Self Care | Payer: 59 | Source: Ambulatory Visit | Attending: Orthopedic Surgery | Admitting: Orthopedic Surgery

## 2013-07-17 ENCOUNTER — Other Ambulatory Visit (HOSPITAL_COMMUNITY): Payer: 59

## 2013-07-17 DIAGNOSIS — IMO0002 Reserved for concepts with insufficient information to code with codable children: Secondary | ICD-10-CM | POA: Insufficient documentation

## 2013-07-17 DIAGNOSIS — M25512 Pain in left shoulder: Secondary | ICD-10-CM

## 2013-07-17 DIAGNOSIS — S46919A Strain of unspecified muscle, fascia and tendon at shoulder and upper arm level, unspecified arm, initial encounter: Secondary | ICD-10-CM | POA: Insufficient documentation

## 2013-07-17 DIAGNOSIS — X58XXXA Exposure to other specified factors, initial encounter: Secondary | ICD-10-CM | POA: Insufficient documentation

## 2013-07-17 DIAGNOSIS — S46819A Strain of other muscles, fascia and tendons at shoulder and upper arm level, unspecified arm, initial encounter: Secondary | ICD-10-CM | POA: Insufficient documentation

## 2013-07-17 DIAGNOSIS — M75100 Unspecified rotator cuff tear or rupture of unspecified shoulder, not specified as traumatic: Secondary | ICD-10-CM | POA: Insufficient documentation

## 2013-07-17 DIAGNOSIS — R599 Enlarged lymph nodes, unspecified: Secondary | ICD-10-CM | POA: Insufficient documentation

## 2013-07-23 ENCOUNTER — Other Ambulatory Visit: Payer: Self-pay | Admitting: Gastroenterology

## 2013-07-23 DIAGNOSIS — R05 Cough: Secondary | ICD-10-CM

## 2013-07-23 DIAGNOSIS — R059 Cough, unspecified: Secondary | ICD-10-CM

## 2013-07-30 ENCOUNTER — Other Ambulatory Visit: Payer: Self-pay | Admitting: Internal Medicine

## 2013-09-10 ENCOUNTER — Other Ambulatory Visit: Payer: Self-pay

## 2013-09-10 MED ORDER — TRAMADOL HCL 50 MG PO TABS: 50.0000 mg | ORAL_TABLET | Freq: Three times a day (TID) | ORAL | Status: DC | PRN

## 2013-09-26 ENCOUNTER — Other Ambulatory Visit: Payer: Self-pay

## 2013-10-07 ENCOUNTER — Ambulatory Visit (INDEPENDENT_AMBULATORY_CARE_PROVIDER_SITE_OTHER): Payer: 59 | Admitting: Internal Medicine

## 2013-10-07 ENCOUNTER — Encounter: Payer: Self-pay | Admitting: Internal Medicine

## 2013-10-07 VITALS — BP 120/78 | HR 47 | Temp 98.1°F | Wt 130.0 lb

## 2013-10-07 DIAGNOSIS — M7702 Medial epicondylitis, left elbow: Secondary | ICD-10-CM

## 2013-10-07 DIAGNOSIS — Z23 Encounter for immunization: Secondary | ICD-10-CM

## 2013-10-07 DIAGNOSIS — Z0000 Encounter for general adult medical examination without abnormal findings: Secondary | ICD-10-CM

## 2013-10-07 DIAGNOSIS — M7700 Medial epicondylitis, unspecified elbow: Secondary | ICD-10-CM

## 2013-10-07 MED ORDER — TRETINOIN 0.05 % EX CREA: TOPICAL_CREAM | Freq: Every day | CUTANEOUS | Status: DC

## 2013-10-07 NOTE — Progress Notes (Signed)
Pre visit review using our clinic review tool, if applicable. No additional management support is needed unless otherwise documented below in the visit note. 

## 2013-10-08 ENCOUNTER — Other Ambulatory Visit: Payer: Self-pay

## 2013-10-08 DIAGNOSIS — I359 Nonrheumatic aortic valve disorder, unspecified: Secondary | ICD-10-CM

## 2013-10-08 DIAGNOSIS — M7702 Medial epicondylitis, left elbow: Secondary | ICD-10-CM | POA: Insufficient documentation

## 2013-10-08 MED ORDER — METHYLPREDNISOLONE ACETATE 80 MG/ML IJ SUSP
40.0000 mg | Freq: Once | INTRAMUSCULAR | Status: AC
  Administered 2013-10-08: 80 mg via INTRA_ARTICULAR

## 2013-10-08 NOTE — Assessment & Plan Note (Signed)
Recommend pneumonia immunization: prevnar first then pneumovax.

## 2013-10-08 NOTE — Progress Notes (Signed)
  Subjective:    Patient ID: Valerie Phelps, female    DOB: [birth year], 67 y.o.   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 She does a lot of reptitive arm movement in the course of her work. The pain has become persistent and failed routine exercise/stretching and oral medications. She denies any weakness or paresthesia. She has had good relief in the past with cortisone injection.  PMH, FamHx and SocHx reviewed for any changes and relevance.  Current Outpatient Prescriptions on File Prior to Visit  Medication Sig Dispense Refill  . ALPRAZolam (XANAX) 0.25 MG tablet TAKE 1 TABLET BY MOUTH AS NEEDED  50 tablet  5  . Calcium Carbonate-Vitamin D (CALCIUM + D PO) Take by mouth.      . diclofenac (VOLTAREN) 75 MG EC tablet TAKE 1 TABLET BY MOUTH TWO TIMES A DAY  60 tablet  4  . estradiol (ESTRING) 2 MG vaginal ring Place 2 mg vaginally every 3 (three) months. follow package directions  1 each  4  . estradiol-norethindrone (ACTIVELLA) 1-0.5 MG per tablet TAKE 1 TABLET BY MOUTH ONCE DAILY  28 tablet  12  . predniSONE (DELTASONE) 10 MG tablet Take as directed  50 tablet  0  . traMADol (ULTRAM) 50 MG tablet Take 1 tablet (50 mg total) by mouth every 8 (eight) hours as needed for pain.  30 tablet  1   Current Facility-Administered Medications on File Prior to Visit  Medication Dose Route Frequency Provider Last Rate Last Dose  . methylPREDNISolone acetate (DEPO-MEDROL) injection 40 mg  40 mg Intra-Lesional Once Jacques Navy, MD          Review of Systems System review is negative for any constitutional, cardiac, pulmonary, GI or neuro symptoms or complaints other than as described in the HPI.     Objective:   Physical Exam Filed Vitals:   10/07/13 1703  BP: 120/78  Pulse: 47  Temp: 98.1 F (36.7 C)   Gen'l- athletic appearing woman in no distress MSK - pain with supination against resistance. Tender to palpation at  the medical epicondyle  Procedure tendon/trigger point injection left medical epicondyle  Indication - localized pain Consent - informed verbal consent from patient after explanation of risks of bleeding and infection Prep - injection site identified, prepped with betadine followed by alcohol. Med -  40  Mg depomedrol with 0.5 Cc 2% xylocain Injection - trigger point/tendon insertion left medial epicondyle. Injected without difficulty. Patient tolerated this well. Post-procedure - patient with rapid reduction in discomfort. Bandaid applied. Routine precautions provided including instruction to return for fever, drainage or increased pain        Assessment & Plan:

## 2013-10-08 NOTE — Assessment & Plan Note (Signed)
Presents today for recurrent pain.  Plan Trigger point/tendon insertion injected with depo/xylocain - good results. No complications.  Continue stretch exercises.

## 2013-10-09 ENCOUNTER — Other Ambulatory Visit: Payer: Self-pay

## 2013-10-09 ENCOUNTER — Other Ambulatory Visit (HOSPITAL_COMMUNITY): Payer: Self-pay | Admitting: Cardiology

## 2013-10-09 DIAGNOSIS — I359 Nonrheumatic aortic valve disorder, unspecified: Secondary | ICD-10-CM

## 2013-10-09 MED ORDER — TRETINOIN 0.05 % EX CREA: TOPICAL_CREAM | Freq: Every day | CUTANEOUS | Status: DC

## 2013-12-02 ENCOUNTER — Encounter: Payer: Self-pay | Admitting: Cardiology

## 2013-12-02 ENCOUNTER — Ambulatory Visit (HOSPITAL_COMMUNITY): Payer: 59 | Attending: Cardiology | Admitting: Radiology

## 2013-12-02 DIAGNOSIS — I359 Nonrheumatic aortic valve disorder, unspecified: Secondary | ICD-10-CM

## 2013-12-02 DIAGNOSIS — I059 Rheumatic mitral valve disease, unspecified: Secondary | ICD-10-CM | POA: Insufficient documentation

## 2013-12-02 DIAGNOSIS — I079 Rheumatic tricuspid valve disease, unspecified: Secondary | ICD-10-CM | POA: Insufficient documentation

## 2013-12-02 NOTE — Progress Notes (Signed)
Echocardiogram performed.  

## 2013-12-05 ENCOUNTER — Encounter: Payer: Self-pay | Admitting: Sports Medicine

## 2013-12-05 ENCOUNTER — Ambulatory Visit (INDEPENDENT_AMBULATORY_CARE_PROVIDER_SITE_OTHER): Payer: 59 | Admitting: Sports Medicine

## 2013-12-05 VITALS — BP 129/81 | Ht 65.0 in | Wt 126.0 lb

## 2013-12-05 DIAGNOSIS — M7660 Achilles tendinitis, unspecified leg: Secondary | ICD-10-CM

## 2013-12-05 DIAGNOSIS — M7661 Achilles tendinitis, right leg: Secondary | ICD-10-CM | POA: Insufficient documentation

## 2013-12-05 DIAGNOSIS — M79661 Pain in right lower leg: Secondary | ICD-10-CM

## 2013-12-05 DIAGNOSIS — M7662 Achilles tendinitis, left leg: Secondary | ICD-10-CM

## 2013-12-05 DIAGNOSIS — M79609 Pain in unspecified limb: Secondary | ICD-10-CM

## 2013-12-05 MED ORDER — NITROGLYCERIN 0.2 MG/HR TD PT24: MEDICATED_PATCH | TRANSDERMAL | Status: DC

## 2013-12-05 NOTE — Assessment & Plan Note (Signed)
Use a compression sleeve for running and recovery for the next several months Home exercise protocol Nitroglycerin protocol Okay to run but avoid hills  Recheck in 2 months  Use heel lifts in all her running shoes

## 2013-12-05 NOTE — Patient Instructions (Signed)
Wear calf sleeve for exercise and for 1/2 to 1 hour afterwards  Start 1/4 nitroglycerin patch daily - apply to right calf and wear for 24 hours then change  Nitroglycerin Protocol   Apply 1/4 nitroglycerin patch to affected area daily.  Change position of patch within the affected area every 24 hours.  You may experience a headache during the first 1-2 weeks of using the patch, these should subside.  If you experience headaches after beginning nitroglycerin patch treatment, you may take your preferred over the counter pain reliever.  Another side effect of the nitroglycerin patch is skin irritation or rash related to patch adhesive.  Please notify our office if you develop more severe headaches or rash, and stop the patch.  Tendon healing with nitroglycerin patch may require 12 to 24 weeks depending on the extent of injury.  Men should not use if taking Viagra, Cialis, or Levitra.   Do not use if you have migraines or rosacea.   Please do suggested exercises twice daily: do with knees straight and knees bent 20 degrees 3 sets of 15 reps  Down on 1 foot, raise up on both feet  Please follow up in 2 months  Thank you for seeing [place] today!

## 2013-12-05 NOTE — Progress Notes (Signed)
Patient ID: Valerie Phelps, female   DOB: 1946-09-10, 68 y.o.   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 She first had Achilles tendon problems in high school She has had them off and on both sides for many years  In August she felt a sharp pop in her right calf early in a run Walking was difficult for 2 days and there was some bruising  She tried to go back to running in September but wound up taking September and October off Elliptical was not painful  She has gotten back into running but still has calf tightness and soreness She is having a difficult time getting back to her normal running routine because of the tightness in still has pain at a 2-3 level after runs  Social history: Copywriter, 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 Quail Social alcohol use/ nonsmoker  Additional medical history documented in the chart  Physical examination No acute distress BP 129/81  Ht 5\' 5"  (1.651 m)  Wt 126 lb (57.153 kg)  BMI 20.97 kg/m2  There is poor definition in the medial aspect of the right calf but on measurement the calf circumferences are essentially equal There is some generalized tenderness along the medial border of the gastrocnemius and the soleus starting 12-15 cm above the calcaneus The Achilles tendon is slightly thickened but I do not feel any nodules  Left Achilles tendon is slightly thick as well There is some discoloration of the skin No tenderness in the calf or soleus  Ankle dorsiflexion is 10    less on the right and is moderately tight  Foot structures neutral  Running gait is neutral MSK [place]   Achilles tendon is normal at the insertion but slightly thickened from 4-12 cm above the calcaneus The medial gastroc shows an area of what appears to be hyperechoic with different hypoechoic splits and increased Doppler activity suggestive of scarring In the soleus just distal to the head of the medial  gastrocnemius there is hypoechoic change in disruption of fibers scattered The left medial gastroc is normal in the soleus fibers looked much more normal  The left Achilles tendon is also thickened

## 2013-12-30 ENCOUNTER — Encounter: Payer: Self-pay | Admitting: Cardiology

## 2013-12-30 DIAGNOSIS — I498 Other specified cardiac arrhythmias: Secondary | ICD-10-CM

## 2013-12-30 DIAGNOSIS — I2510 Atherosclerotic heart disease of native coronary artery without angina pectoris: Secondary | ICD-10-CM

## 2013-12-30 DIAGNOSIS — I351 Nonrheumatic aortic (valve) insufficiency: Secondary | ICD-10-CM

## 2013-12-30 DIAGNOSIS — I5189 Other ill-defined heart diseases: Secondary | ICD-10-CM

## 2013-12-30 DIAGNOSIS — R9430 Abnormal result of cardiovascular function study, unspecified: Secondary | ICD-10-CM

## 2013-12-30 DIAGNOSIS — IMO0001 Reserved for inherently not codable concepts without codable children: Secondary | ICD-10-CM

## 2013-12-30 NOTE — Assessment & Plan Note (Signed)
The issue of possible diastolic dysfunction was reviewed at great length in 2012. Stress echo revealed a normal response. It has been felt that the diastolic parameters are difficult to assess in this patient with a long-standing exercise history. No further workup was undertaken. Cardiac MRI was not done.

## 2013-12-30 NOTE — Assessment & Plan Note (Signed)
The patient frequently has atrial bigeminy at a slow heart rate.

## 2013-12-30 NOTE — Assessment & Plan Note (Signed)
There was question in 2011 of some increase in right ventricular size. Ultimately it was decided that there was no significant abnormality. There has been no evidence of pulmonary hypertension. There was no PFO or ASD by bubble study. Right ventricular size and function appear normal, echo, January, 2015.

## 2013-12-30 NOTE — Assessment & Plan Note (Signed)
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 Aortic insufficiency has been mild/moderate when checked in 2011, 2012, 2013, and 2015.

## 2013-12-30 NOTE — Progress Notes (Addendum)
Summary note December 30, 2013:     NOTE:  THE DIAGNOSES LISTED AT THE TOP OF THIS NOTE ARE NOT CORRECT  For complete summaries previously documented, refer to the following EPIC Encounters:                                   12/14/2010  [facility] Conversion  Office Visit    Valerie Phelps                                  01/02/2012   Documentation     Valerie Phelps  The patient had a followup 2-D echo January, 2015. Aortic insufficiency remains mild to moderate. Right ventricular function looks good. There is no evidence of pulmonary hypertension. The ejection fraction is stable at 55-60%. Overall, there is no significant change.  Assessment:

## 2013-12-30 NOTE — Assessment & Plan Note (Signed)
Calcium score was 61 on cardiac CT November, 2011. 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  LAD less than 30%.  RCA less than 30%

## 2013-12-30 NOTE — Assessment & Plan Note (Signed)
Ejection fraction has been in the 55-60% range over the years with repeated echoes.

## 2014-02-06 ENCOUNTER — Ambulatory Visit: Payer: 59 | Admitting: Sports Medicine

## 2014-02-20 ENCOUNTER — Ambulatory Visit: Payer: 59 | Admitting: Sports Medicine

## 2014-03-20 ENCOUNTER — Encounter: Payer: Self-pay | Admitting: Sports Medicine

## 2014-03-20 ENCOUNTER — Ambulatory Visit (INDEPENDENT_AMBULATORY_CARE_PROVIDER_SITE_OTHER): Payer: 59 | Admitting: Sports Medicine

## 2014-03-20 VITALS — BP 143/75 | HR 41 | Ht 65.0 in | Wt 126.0 lb

## 2014-03-20 DIAGNOSIS — M7661 Achilles tendinitis, right leg: Secondary | ICD-10-CM

## 2014-03-20 DIAGNOSIS — M7662 Achilles tendinitis, left leg: Principal | ICD-10-CM

## 2014-03-20 DIAGNOSIS — M7660 Achilles tendinitis, unspecified leg: Secondary | ICD-10-CM

## 2014-03-20 MED ORDER — TRAMADOL HCL 50 MG PO TABS: 50.0000 mg | ORAL_TABLET | Freq: Three times a day (TID) | ORAL | Status: DC | PRN

## 2014-03-20 MED ORDER — NITROGLYCERIN 0.2 MG/HR TD PT24: MEDICATED_PATCH | TRANSDERMAL | Status: DC

## 2014-03-20 MED ORDER — DICLOFENAC SODIUM 75 MG PO TBEC: 75.0000 mg | DELAYED_RELEASE_TABLET | Freq: Two times a day (BID) | ORAL | Status: DC | PRN

## 2014-03-20 NOTE — Progress Notes (Signed)
Patient ID: Valerie Phelps, female   DOB: [birth year], 68 y.o.   MRN: [id number]  Patient returns after a right calf tear that occurred in January She had a history of chronic Achilles tendinitis started at age 21 She has been a runner for many years This has improved enough to allow her to start back running  She still feels some pain in the medial calf but none over the right Achilles  pain on the left Achilles is gone  She took about one month and nitroglycerin and got some relief of pain with this but has not refilled the prescriptions until her followup  Compression sleeve also helped her calf and she uses one that she had received when she had varicose vein surgery  Sports insole with heel lift feels comfortable  Physical examination No acute distress BP 143/75  Pulse 41  Ht 5\' 5"  (1.651 m)  Wt 126 lb (57.153 kg)  BMI 20.97 kg/m2  Right calf is nontender to palpation even over the medial gastroc Right Achilles feels less thick and is nontender to palpation  Left calf totally nontender  Left Achilles feels very slightly thick but is nontender  Running gait today is without any limp I do think her push off is slightly decreased on the right  MSK ultrasound - RT calf There is less hypoechoic change around the medial gastroc and the upper soleus been on her previous ultrasound There is some mild fiber disruption at one section of the medial head of the gastroc and the same area of the soleus The Achilles tendon does not show hypoechoic change today and the thickness at 4-6 cm above the calcaneus is only 0.63

## 2014-03-20 NOTE — Patient Instructions (Signed)
Do more heel raises with knee bent 20 degrees - do these on a step make sure heel goes below step.  If 3 sets of 15 gets easy- add a backpack with weight in it  Restart 1/4 nitroglycerin patch on calf daily  Please follow up in 3 weeks   Thank you for seeing [place] today!

## 2014-03-20 NOTE — Assessment & Plan Note (Signed)
This has improved a good deal in the last 3 months  I encouraged her to continue her calf exercises but she needs had been for the soleus muscle as well and she needs to advance them to doing on 1 leg  Keep using compression  Restart nitroglycerin if she was getting a benefit  Okay to run but limit the intensity until the calf is better and recheck with me in 3 months

## 2014-03-21 ENCOUNTER — Other Ambulatory Visit: Payer: Self-pay

## 2014-03-21 NOTE — Telephone Encounter (Signed)
[place] patient - Received a request from [place] for Tramadol 50 mg. I show patient already received the script on 03/20/14 from Dr Stefanie Libel. I faxed the pharmacy back letting them know this.

## 2014-04-28 ENCOUNTER — Other Ambulatory Visit: Payer: Self-pay | Admitting: *Deleted

## 2014-04-28 ENCOUNTER — Telehealth: Payer: Self-pay | Admitting: *Deleted

## 2014-04-28 MED ORDER — ALPRAZOLAM 0.25 MG PO TABS: ORAL_TABLET | ORAL | Status: DC

## 2014-04-28 NOTE — Telephone Encounter (Signed)
Either ok - the prior rx in EPIC say #50 with 5 refills done by [facility].. Please check with Dr Olevia Perches and let me know pt preference i will do whatever needed thanks

## 2014-04-28 NOTE — Telephone Encounter (Signed)
Left msg on vm stating received script for xanax # quanity #50, want to double check quanity. Pt normal get # 30...Valerie Phelps

## 2014-04-28 NOTE — Telephone Encounter (Signed)
Faxed script back to [place].../lmb 

## 2014-04-29 NOTE — Telephone Encounter (Signed)
Notified elizabeth with md response. She stated will filled as before # 50 for a 30 day supply...Valerie Phelps

## 2014-05-30 ENCOUNTER — Other Ambulatory Visit: Payer: Self-pay

## 2014-05-31 MED ORDER — ESTRADIOL-NORETHINDRONE ACET 1-0.5 MG PO TABS: 1.0000 | ORAL_TABLET | Freq: Every day | ORAL | Status: DC

## 2014-07-14 ENCOUNTER — Ambulatory Visit (INDEPENDENT_AMBULATORY_CARE_PROVIDER_SITE_OTHER): Payer: 59 | Admitting: Internal Medicine

## 2014-07-14 ENCOUNTER — Encounter: Payer: Self-pay | Admitting: Internal Medicine

## 2014-07-14 ENCOUNTER — Ambulatory Visit (INDEPENDENT_AMBULATORY_CARE_PROVIDER_SITE_OTHER)
Admission: RE | Admit: 2014-07-14 | Discharge: 2014-07-14 | Disposition: A | Discharge status: Home / Self Care | Payer: 59 | Source: Ambulatory Visit | Attending: Internal Medicine | Admitting: Internal Medicine

## 2014-07-14 ENCOUNTER — Ambulatory Visit: Payer: 59 | Admitting: Internal Medicine

## 2014-07-14 ENCOUNTER — Other Ambulatory Visit: Payer: Self-pay

## 2014-07-14 VITALS — BP 138/80 | HR 50 | Temp 98.0°F | Wt 129.0 lb

## 2014-07-14 DIAGNOSIS — M949 Disorder of cartilage, unspecified: Secondary | ICD-10-CM

## 2014-07-14 DIAGNOSIS — M8580 Other specified disorders of bone density and structure, unspecified site: Secondary | ICD-10-CM

## 2014-07-14 DIAGNOSIS — C50919 Malignant neoplasm of unspecified site of unspecified female breast: Secondary | ICD-10-CM

## 2014-07-14 DIAGNOSIS — M899 Disorder of bone, unspecified: Secondary | ICD-10-CM

## 2014-07-14 DIAGNOSIS — M542 Cervicalgia: Secondary | ICD-10-CM

## 2014-07-14 DIAGNOSIS — Z23 Encounter for immunization: Secondary | ICD-10-CM

## 2014-07-14 DIAGNOSIS — M810 Age-related osteoporosis without current pathological fracture: Secondary | ICD-10-CM | POA: Insufficient documentation

## 2014-07-14 DIAGNOSIS — Z0000 Encounter for general adult medical examination without abnormal findings: Secondary | ICD-10-CM

## 2014-07-14 MED ORDER — ESTRADIOL-NORETHINDRONE ACET 1-0.5 MG PO TABS: 1.0000 | ORAL_TABLET | Freq: Every day | ORAL | Status: DC

## 2014-07-14 MED ORDER — ESTRADIOL-NORETHINDRONE ACET 1-0.5 MG PO TABS: 1.0000 | ORAL_TABLET | ORAL | Status: DC

## 2014-07-14 NOTE — Patient Instructions (Addendum)
It was good to see you today.  We have reviewed your prior records including labs and tests today  Health Maintenance reviewed - pneumovax updated today - all other recommended immunizations and age-appropriate screenings are up-to-date.  Test(s) ordered today. Perform labs in AM when you are fasting - xray today Your results will be released to MyChart (or called to you) after review, usually within 72hours after test completion. If any changes need to be made, you will be notified at that same time.  Medications reviewed and updated, no changes recommended at this time. Refill on medication(s) as discussed today.  we'll make appointment for DEXA scan. Our office will contact you regarding appointment(s) once made.  Please schedule followup in 12-24 months for annual exam and labs, call sooner if problems.  Health Maintenance Adopting a healthy lifestyle and getting preventive care can go a long way to promote health and wellness. Talk with your health care provider about what schedule of regular examinations is right for you. This is a good chance for you to check in with your provider about disease prevention and staying healthy. In between checkups, there are plenty of things you can do on your own. Experts have done a lot of research about which lifestyle changes and preventive measures are most likely to keep you healthy. Ask your health care provider for more information. WEIGHT AND DIET  Eat a healthy diet  Be sure to include plenty of vegetables, fruits, low-fat dairy products, and lean protein.  Do not eat a lot of foods high in solid fats, added sugars, or salt.  Get regular exercise. This is one of the most important things you can do for your health.  Most adults should exercise for at least 150 minutes each week. The exercise should increase your heart rate and make you sweat (moderate-intensity exercise).  Most adults should also do strengthening exercises at least twice  a week. This is in addition to the moderate-intensity exercise.  Maintain a healthy weight  Body mass index (BMI) is a measurement that can be used to identify possible weight problems. It estimates body fat based on height and weight. Your health care provider can help determine your BMI and help you achieve or maintain a healthy weight.  For females 20 years of age and older:   A BMI below 18.5 is considered underweight.  A BMI of 18.5 to 24.9 is normal.  A BMI of 25 to 29.9 is considered overweight.  A BMI of 30 and above is considered obese.  Watch levels of cholesterol and blood lipids  You should start having your blood tested for lipids and cholesterol at 68 years of age, then have this test every 5 years.  You may need to have your cholesterol levels checked more often if:  Your lipid or cholesterol levels are high.  You are older than 68 years of age.  You are at high risk for heart disease.  CANCER SCREENING   Lung Cancer  Lung cancer screening is recommended for adults 55-80 years old who are at high risk for lung cancer because of a history of smoking.  A yearly low-dose CT scan of the lungs is recommended for people who:  Currently smoke.  Have quit within the past 15 years.  Have at least a 30-pack-year history of smoking. A pack year is smoking an average of one pack of cigarettes a day for 1 year.  Yearly screening should continue until it has been 15 years since   you quit.  Yearly screening should stop if you develop a health problem that would prevent you from having lung cancer treatment.  Breast Cancer  Practice breast self-awareness. This means understanding how your breasts normally appear and feel.  It also means doing regular breast self-exams. Let your health care provider know about any changes, no matter how small.  If you are in your 20s or 30s, you should have a clinical breast exam (CBE) by a health care provider every 1-3 years as  part of a regular health exam.  If you are 40 or older, have a CBE every year. Also consider having a breast X-ray (mammogram) every year.  If you have a family history of breast cancer, talk to your health care provider about genetic screening.  If you are at high risk for breast cancer, talk to your health care provider about having an MRI and a mammogram every year.  Breast cancer gene (BRCA) assessment is recommended for women who have family members with BRCA-related cancers. BRCA-related cancers include:  Breast.  Ovarian.  Tubal.  Peritoneal cancers.  Results of the assessment will determine the need for genetic counseling and BRCA1 and BRCA2 testing. Cervical Cancer Routine pelvic examinations to screen for cervical cancer are no longer recommended for nonpregnant women who are considered low risk for cancer of the pelvic organs (ovaries, uterus, and vagina) and who do not have symptoms. A pelvic examination may be necessary if you have symptoms including those associated with pelvic infections. Ask your health care provider if a screening pelvic exam is right for you.   The Pap test is the screening test for cervical cancer for women who are considered at risk.  If you had a hysterectomy for a problem that was not cancer or a condition that could lead to cancer, then you no longer need Pap tests.  If you are older than 65 years, and you have had normal Pap tests for the past 10 years, you no longer need to have Pap tests.  If you have had past treatment for cervical cancer or a condition that could lead to cancer, you need Pap tests and screening for cancer for at least 20 years after your treatment.  If you no longer get a Pap test, assess your risk factors if they change (such as having a new sexual partner). This can affect whether you should start being screened again.  Some women have medical problems that increase their chance of getting cervical cancer. If this is the  case for you, your health care provider may recommend more frequent screening and Pap tests.  The human papillomavirus (HPV) test is another test that may be used for cervical cancer screening. The HPV test looks for the virus that can cause cell changes in the cervix. The cells collected during the Pap test can be tested for HPV.  The HPV test can be used to screen women 30 years of age and older. Getting tested for HPV can extend the interval between normal Pap tests from three to five years.  An HPV test also should be used to screen women of any age who have unclear Pap test results.  After 68 years of age, women should have HPV testing as often as Pap tests.  Colorectal Cancer  This type of cancer can be detected and often prevented.  Routine colorectal cancer screening usually begins at 68 years of age and continues through 68 years of age.  Your health care provider may   recommend screening at an earlier age if you have risk factors for colon cancer.  Your health care provider may also recommend using home test kits to check for hidden blood in the stool.  A small camera at the end of a tube can be used to examine your colon directly (sigmoidoscopy or colonoscopy). This is done to check for the earliest forms of colorectal cancer.  Routine screening usually begins at age 71.  Direct examination of the colon should be repeated every 5-10 years through 68 years of age. However, you may need to be screened more often if early forms of precancerous polyps or small growths are found. Skin Cancer  Check your skin from head to toe regularly.  Tell your health care provider about any new moles or changes in moles, especially if there is a change in a mole's shape or color.  Also tell your health care provider if you have a mole that is larger than the size of a pencil eraser.  Always use sunscreen. Apply sunscreen liberally and repeatedly throughout the day.  Protect yourself by  wearing long sleeves, pants, a wide-brimmed hat, and sunglasses whenever you are outside. HEART DISEASE, DIABETES, AND HIGH BLOOD PRESSURE   Have your blood pressure checked at least every 1-2 years. High blood pressure causes heart disease and increases the risk of stroke.  If you are between 86 years and 85 years old, ask your health care provider if you should take aspirin to prevent strokes.  Have regular diabetes screenings. This involves taking a blood sample to check your fasting blood sugar level.  If you are at a normal weight and have a low risk for diabetes, have this test once every three years after 68 years of age.  If you are overweight and have a high risk for diabetes, consider being tested at a younger age or more often. PREVENTING INFECTION  Hepatitis B  If you have a higher risk for hepatitis B, you should be screened for this virus. You are considered at high risk for hepatitis B if:  You were born in a country where hepatitis B is common. Ask your health care provider which countries are considered high risk.  Your parents were born in a high-risk country, and you have not been immunized against hepatitis B (hepatitis B vaccine).  You have HIV or AIDS.  You use needles to inject street drugs.  You live with someone who has hepatitis B.  You have had sex with someone who has hepatitis B.  You get hemodialysis treatment.  You take certain medicines for conditions, including cancer, organ transplantation, and autoimmune conditions. Hepatitis C  Blood testing is recommended for:  Everyone born from [birth year] through 1965.  Anyone with known risk factors for hepatitis C. Sexually transmitted infections (STIs)  You should be screened for sexually transmitted infections (STIs) including gonorrhea and chlamydia if:  You are sexually active and are younger than 68 years of age.  You are older than 68 years of age and your health care provider tells you that you  are at risk for this type of infection.  Your sexual activity has changed since you were last screened and you are at an increased risk for chlamydia or gonorrhea. Ask your health care provider if you are at risk.  If you do not have HIV, but are at risk, it may be recommended that you take a prescription medicine daily to prevent HIV infection. This is called pre-exposure prophylaxis (PrEP). You  are considered at risk if:  You are sexually active and do not regularly use condoms or know the HIV status of your partner(s).  You take drugs by injection.  You are sexually active with a partner who has HIV. Talk with your health care provider about whether you are at high risk of being infected with HIV. If you choose to begin PrEP, you should first be tested for HIV. You should then be tested every 3 months for as long as you are taking PrEP.  PREGNANCY   If you are premenopausal and you may become pregnant, ask your health care provider about preconception counseling.  If you may become pregnant, take 400 to 800 micrograms (mcg) of folic acid every day.  If you want to prevent pregnancy, talk to your health care provider about birth control (contraception). OSTEOPOROSIS AND MENOPAUSE   Osteoporosis is a disease in which the bones lose minerals and strength with aging. This can result in serious bone fractures. Your risk for osteoporosis can be identified using a bone density scan.  If you are 65 years of age or older, or if you are at risk for osteoporosis and fractures, ask your health care provider if you should be screened.  Ask your health care provider whether you should take a calcium or vitamin D supplement to lower your risk for osteoporosis.  Menopause may have certain physical symptoms and risks.  Hormone replacement therapy may reduce some of these symptoms and risks. Talk to your health care provider about whether hormone replacement therapy is right for you.  HOME CARE  INSTRUCTIONS   Schedule regular health, dental, and eye exams.  Stay current with your immunizations.   Do not use any tobacco products including cigarettes, chewing tobacco, or electronic cigarettes.  If you are pregnant, do not drink alcohol.  If you are breastfeeding, limit how much and how often you drink alcohol.  Limit alcohol intake to no more than 1 drink per day for nonpregnant women. One drink equals 12 ounces of beer, 5 ounces of wine, or 1 ounces of hard liquor.  Do not use street drugs.  Do not share needles.  Ask your health care provider for help if you need support or information about quitting drugs.  Tell your health care provider if you often feel depressed.  Tell your health care provider if you have ever been abused or do not feel safe at home. Document Released: 05/23/2011 Document Revised: 03/24/2014 Document Reviewed: 10/09/2013 [employer] Patient Information 2015 [employer]. This information is not intended to replace advice given to you by your health care provider. Make sure you discuss any questions you have with your health care provider.  

## 2014-07-14 NOTE — Progress Notes (Signed)
Subjective:    Patient ID: Valerie Phelps, female    DOB: 06/05/1946, 68 y.o.   MRN: [id number]  HPI  [facility] from [facility] due to [facility] - here to establish with new PCP  Also patient is here today for annual physical. Patient feels well in general. Would like to be tested for BRCA 1 and 2 due to breast cancer hostory  Reviewed chronic medical issues and interval medical events  Past Medical History  Diagnosis Date  . 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 Atrial bigeminy     Present at slow heart rate  . Diastolic dysfunction     Stress echo, normal, November, 2012  . Abnormal CT scan, chest     Question of abnormal lymph nodes in the chest, biopsy was -2011  . Ejection fraction     EF 60%, echo, November, 2012  . CAD (coronary artery disease)     Cardiac CT,09/2010, calcium score 61, , small nidus LAD and RCA, and a below the knee  . Right ventricle     Question of mild right ventricular enlargement  in 2011 /   right ventricular size seems normal  in November, 2012, there is no documented pulmonary hypertension.  . Breast cancer     DCIS  . Personal history of colonic polyps-1 cm hyperplastic splenic flexure 11/29/2012   Family History  Problem Relation Age of Onset  . Prostate cancer Father   . Breast cancer Maternal Aunt     Age 32  . Breast cancer Paternal Grandmother     Age 20's  . Colon cancer Paternal Grandfather    History  Substance Use Topics  . Smoking status: Never Smoker   . Smokeless tobacco: Never Used  . Alcohol Use: 2.5 oz/week    5 drink(s) per week     Comment: occ    Review of Systems  Constitutional: Negative for fatigue and unexpected weight change.  Respiratory: Negative for cough, shortness of breath and wheezing.   Cardiovascular: Negative for chest pain, palpitations and leg swelling.  Gastrointestinal: Negative for nausea, abdominal pain and diarrhea.  Musculoskeletal: Positive for  arthralgias and neck pain (ache x 2 mo, esp night (supine) - no radiation into arms). Negative for gait problem and joint swelling.  Neurological: Negative for dizziness, weakness, light-headedness and headaches.  Psychiatric/Behavioral: Negative for dysphoric mood. The patient is not nervous/anxious.   All other systems reviewed and are negative.      Objective:   Physical Exam  BP 138/80  Pulse 50  Temp(Src) 98 F (36.7 C) (Oral)  Wt 129 lb (58.514 kg)  SpO2 98% Wt Readings from Last 3 Encounters:  07/14/14 129 lb (58.514 kg)  03/20/14 126 lb (57.153 kg)  12/05/13 126 lb (57.153 kg)   Constitutional: She appears well-developed and well-nourished. No distress.  HENT: Head: Normocephalic and atraumatic. Ears: partial cerumen occlusion on left - otherwise B TMs ok, no erythema or effusion; Nose: Nose normal. Mouth/Throat: Oropharynx is clear and moist. No oropharyngeal exudate.  Eyes: Conjunctivae and EOM are normal. Pupils are equal, round, and reactive to light. No scleral icterus.  Neck: Normal range of motion. Neck supple. No JVD present. No thyromegaly present.  Cardiovascular: Normal rate, regular rhythm and normal heart sounds - freq PVCs.  No murmur heard. No BLE edema. Pulmonary/Chest: Effort normal and breath sounds normal. No respiratory distress. She has no wheezes.  Abdominal: Soft. Bowel sounds are  normal. She exhibits no distension. There is no tenderness. no masses GU/breast: defer to gyn Musculoskeletal: Normal range of motion, no joint effusions. No gross deformities Neurological: She is alert and oriented to person, place, and time. No cranial nerve deficit. Coordination, balance, strength, speech and gait are normal.  Skin: Skin is warm and dry. No rash noted. No erythema.  Psychiatric: She has a normal mood and affect. Her behavior is normal. Judgment and thought content normal.    Lab Results  Component Value Date   WBC 4.9 09/06/2012   HGB 12.9 09/06/2012     HCT 40.0 09/06/2012   PLT 201.0 09/06/2012   GLUCOSE 99 09/10/2010   ALT 18 02/03/2009   AST 22 02/03/2009   NA 138 09/10/2010   K 4.8 09/10/2010   CL 104 09/10/2010   CREATININE 0.8 09/10/2010   BUN 17 09/10/2010   CO2 29 09/10/2010   TSH 2.98 02/03/2009    Mr Shoulder Left Wo Contrast  07/18/2013   *RADIOLOGY REPORT*  Clinical Data:  Left shoulder pain.  Pain with abduction for 5 weeks.  Steroid injection 8 weeks ago with minimal relief.  MRI OF THE LEFT SHOULDER WITHOUT CONTRAST  Technique:  Multiplanar, multisequence MR imaging of the left shoulder was performed.  No intravenous contrast was administered.  Comparison:  04/01/2011.  FINDINGS: Enlarged left axillary lymph nodes are present. The largest node measures 12 mm short axis on axial imaging, which is unchanged compared to the chest CT 04/01/2011.  Allowing for differences in technique, the appearance of the left axillary lymph nodes is similar to the prior chest CT, without interval change. Rotator cuff:  Intrasubstance tearing in the posterior insertional supraspinatus tendon and conjoined fibers of supraspinatus and infraspinatus, with radiating bone marrow edema in the rotator cuff footprint.  There is no full-thickness tear or retraction.  There are small areas of fissuring extending to the bursal surface.  No convincing articular surface extension.  The bulk of infraspinatus tendon appears intact.  Anterior supraspinatus insertion is intact. Teres minor tendon intact.  The subscapularis tendon appears within normal limits. Muscles:  Negative for atrophy or edema.  No evidence of strain. Biceps long head:  Intact.  Acromioclavicular Joint:  Type 2 acromion.  No significant AC joint osteoarthritis.  Subacromial bursitis. Glenohumeral Joint:  Normal.  Labrum:  Grossly normal. Bones:  No aggressive osseous lesions.  No fracture.  IMPRESSION: 1.  Supraspinatus and infraspinatus tendinopathy with small insertional intrasubstance tear at the  conjoined fibers of supraspinatus and infraspinatus.  No full-thickness tear. Reactive bone marrow edema in the posterior greater tuberosity/footprint. 2.  Subacromial bursitis. 3.  Left axillary adenopathy appears similar to prior CT 04/01/2011 and on discussion with Dr. Olevia Perches has been biopsied and is benign/reactive.   Original Report Authenticated By: Dereck Ligas, M.D.       Assessment & Plan:   CPX/v70.0 - Patient has been counseled on age-appropriate routine health concerns for screening and prevention. These are reviewed and up-to-date. Immunizations are up-to-date or declined. Labs ordered and reviewed.  Neck pain - cervicalgia, suspect MSkel - check DG at pt request because hx DDD - no neuro findings to suggest spinal stenosis or impingment  Problem List Items Addressed This Visit   ADENOCARCINOMA, BREAST, ER POSITIVE     B mastectomy 1995 for invasive adenoca, ER positive No mammo due to same Requests [facility] genetic testing - will order same now    Relevant Orders      BRCA1 Targeted  Analysis      BRCA2 Targeted Analysis   Osteopenia   Relevant Orders      DG Bone Density    Other Visit Diagnoses   Routine general medical examination at a health care facility    -  Primary    Relevant Orders       Basic metabolic panel       CBC with Differential       Hepatic function panel       Lipid panel       TSH       Urinalysis, Routine w reflex microscopic       Ferritin       Vit D  25 hydroxy (rtn osteoporosis monitoring)       Sedimentation rate       BRCA1 Targeted Analysis       BRCA2 Targeted Analysis    Cervical pain (neck)        Relevant Orders       DG Cervical Spine 2 or 3 views (Completed)    Need for prophylactic vaccination against Streptococcus pneumoniae (pneumococcus)        Relevant Orders       Pneumococcal polysaccharide vaccine 23-valent greater than or equal to 2yo subcutaneous/IM (Completed)

## 2014-07-14 NOTE — Assessment & Plan Note (Signed)
Hx Vit D deficiency  On HRT despite breast cancer hx - Recheck DEXA and Vit D now

## 2014-07-14 NOTE — Assessment & Plan Note (Signed)
B mastectomy 1995 for invasive adenoca, ER positive No mammo due to same Requests [facility] genetic testing - will order same now

## 2014-07-14 NOTE — Telephone Encounter (Signed)
Pt called and stated that rx needed to go to [facility] and also the sig needed to be qd instead of 3xweek.  Changes and erx done.

## 2014-07-16 ENCOUNTER — Other Ambulatory Visit (INDEPENDENT_AMBULATORY_CARE_PROVIDER_SITE_OTHER): Payer: 59

## 2014-07-16 DIAGNOSIS — Z0000 Encounter for general adult medical examination without abnormal findings: Secondary | ICD-10-CM

## 2014-07-16 LAB — VITAMIN D 25 HYDROXY (VIT D DEFICIENCY, FRACTURES): VITD: 29.62 ng/mL — ABNORMAL LOW (ref 30.00–100.00)

## 2014-07-16 LAB — HEPATIC FUNCTION PANEL
ALBUMIN: 4 g/dL (ref 3.5–5.2)
ALK PHOS: 54 U/L (ref 39–117)
ALT: 17 U/L (ref 0–35)
AST: 21 U/L (ref 0–37)
Bilirubin, Direct: 0.1 mg/dL (ref 0.0–0.3)
Total Bilirubin: 0.5 mg/dL (ref 0.2–1.2)
Total Protein: 6.6 g/dL (ref 6.0–8.3)

## 2014-07-16 LAB — URINALYSIS, ROUTINE W REFLEX MICROSCOPIC
Bilirubin Urine: NEGATIVE
Hgb urine dipstick: NEGATIVE
KETONES UR: NEGATIVE
LEUKOCYTES UA: NEGATIVE
NITRITE: NEGATIVE
RBC / HPF: NONE SEEN (ref 0–?)
SPECIFIC GRAVITY, URINE: 1.01 (ref 1.000–1.030)
Total Protein, Urine: NEGATIVE
UROBILINOGEN UA: 0.2 (ref 0.0–1.0)
Urine Glucose: NEGATIVE
pH: 7 (ref 5.0–8.0)

## 2014-07-16 LAB — CBC WITH DIFFERENTIAL/PLATELET
BASOS ABS: 0 10*3/uL (ref 0.0–0.1)
Basophils Relative: 0.7 % (ref 0.0–3.0)
EOS PCT: 4 % (ref 0.0–5.0)
Eosinophils Absolute: 0.2 10*3/uL (ref 0.0–0.7)
HCT: 40.4 % (ref 36.0–46.0)
HEMOGLOBIN: 13.4 g/dL (ref 12.0–15.0)
Lymphocytes Relative: 26 % (ref 12.0–46.0)
Lymphs Abs: 1.4 10*3/uL (ref 0.7–4.0)
MCHC: 33.1 g/dL (ref 30.0–36.0)
MCV: 90.3 fl (ref 78.0–100.0)
MONOS PCT: 7.8 % (ref 3.0–12.0)
Monocytes Absolute: 0.4 10*3/uL (ref 0.1–1.0)
NEUTROS ABS: 3.2 10*3/uL (ref 1.4–7.7)
Neutrophils Relative %: 61.5 % (ref 43.0–77.0)
Platelets: 206 10*3/uL (ref 150.0–400.0)
RBC: 4.47 Mil/uL (ref 3.87–5.11)
RDW: 13.1 % (ref 11.5–15.5)
WBC: 5.2 10*3/uL (ref 4.0–10.5)

## 2014-07-16 LAB — LIPID PANEL
CHOLESTEROL: 200 mg/dL (ref 0–200)
HDL: 86.2 mg/dL (ref >39.00)
LDL Cholesterol: 103 mg/dL — ABNORMAL HIGH (ref 0–99)
NONHDL: 113.8
Total CHOL/HDL Ratio: 2
Triglycerides: 54 mg/dL (ref 0.0–149.0)
VLDL: 10.8 mg/dL (ref 0.0–40.0)

## 2014-07-16 LAB — SEDIMENTATION RATE: Sed Rate: 5 mm/hr (ref 0–22)

## 2014-07-16 LAB — BASIC METABOLIC PANEL
BUN: 17 mg/dL (ref 6–23)
CO2: 27 mEq/L (ref 19–32)
Calcium: 9.4 mg/dL (ref 8.4–10.5)
Chloride: 106 mEq/L (ref 96–112)
Creatinine, Ser: 0.8 mg/dL (ref 0.4–1.2)
GFR: 73.57 mL/min (ref >60.00)
GLUCOSE: 80 mg/dL (ref 70–99)
POTASSIUM: 5 meq/L (ref 3.5–5.1)
SODIUM: 139 meq/L (ref 135–145)

## 2014-07-16 LAB — TSH: TSH: 3.29 u[IU]/mL (ref 0.35–4.50)

## 2014-07-16 LAB — FERRITIN: Ferritin: 99.5 ng/mL (ref 10.0–291.0)

## 2014-07-17 ENCOUNTER — Ambulatory Visit (INDEPENDENT_AMBULATORY_CARE_PROVIDER_SITE_OTHER)
Admission: RE | Admit: 2014-07-17 | Discharge: 2014-07-17 | Disposition: A | Discharge status: Home / Self Care | Payer: 59 | Source: Ambulatory Visit | Attending: Internal Medicine | Admitting: Internal Medicine

## 2014-07-17 DIAGNOSIS — M8580 Other specified disorders of bone density and structure, unspecified site: Secondary | ICD-10-CM

## 2014-07-17 DIAGNOSIS — M949 Disorder of cartilage, unspecified: Secondary | ICD-10-CM

## 2014-07-17 DIAGNOSIS — M899 Disorder of bone, unspecified: Secondary | ICD-10-CM

## 2014-07-18 ENCOUNTER — Encounter: Payer: Self-pay | Admitting: Internal Medicine

## 2014-09-11 ENCOUNTER — Other Ambulatory Visit: Payer: Self-pay | Admitting: Dermatology

## 2014-09-22 ENCOUNTER — Encounter: Payer: Self-pay | Admitting: Internal Medicine

## 2014-10-29 ENCOUNTER — Other Ambulatory Visit: Payer: Self-pay

## 2014-10-30 MED ORDER — TRETINOIN 0.05 % EX CREA: TOPICAL_CREAM | Freq: Every day | CUTANEOUS | Status: DC

## 2015-03-16 ENCOUNTER — Other Ambulatory Visit: Payer: Self-pay | Admitting: Internal Medicine

## 2015-07-16 ENCOUNTER — Ambulatory Visit (INDEPENDENT_AMBULATORY_CARE_PROVIDER_SITE_OTHER): Payer: 59 | Admitting: Internal Medicine

## 2015-07-16 ENCOUNTER — Encounter: Payer: Self-pay | Admitting: Internal Medicine

## 2015-07-16 ENCOUNTER — Other Ambulatory Visit (INDEPENDENT_AMBULATORY_CARE_PROVIDER_SITE_OTHER): Payer: 59

## 2015-07-16 ENCOUNTER — Ambulatory Visit (INDEPENDENT_AMBULATORY_CARE_PROVIDER_SITE_OTHER)
Admission: RE | Admit: 2015-07-16 | Discharge: 2015-07-16 | Disposition: A | Discharge status: Home / Self Care | Payer: 59 | Source: Ambulatory Visit | Attending: Internal Medicine | Admitting: Internal Medicine

## 2015-07-16 VITALS — BP 134/78 | HR 57 | Temp 97.9°F | Ht 65.0 in | Wt 127.5 lb

## 2015-07-16 DIAGNOSIS — Z23 Encounter for immunization: Secondary | ICD-10-CM | POA: Diagnosis not present

## 2015-07-16 DIAGNOSIS — H6123 Impacted cerumen, bilateral: Secondary | ICD-10-CM | POA: Diagnosis not present

## 2015-07-16 DIAGNOSIS — Z1159 Encounter for screening for other viral diseases: Secondary | ICD-10-CM | POA: Diagnosis not present

## 2015-07-16 DIAGNOSIS — Z0000 Encounter for general adult medical examination without abnormal findings: Secondary | ICD-10-CM

## 2015-07-16 DIAGNOSIS — R7611 Nonspecific reaction to tuberculin skin test without active tuberculosis: Secondary | ICD-10-CM

## 2015-07-16 DIAGNOSIS — Z114 Encounter for screening for human immunodeficiency virus [HIV]: Secondary | ICD-10-CM | POA: Diagnosis not present

## 2015-07-16 DIAGNOSIS — T50A95A Adverse effect of other bacterial vaccines, initial encounter: Secondary | ICD-10-CM

## 2015-07-16 LAB — CBC WITH DIFFERENTIAL/PLATELET
BASOS ABS: 0 10*3/uL (ref 0.0–0.1)
Basophils Relative: 0.7 % (ref 0.0–3.0)
EOS ABS: 0.2 10*3/uL (ref 0.0–0.7)
Eosinophils Relative: 3.1 % (ref 0.0–5.0)
HCT: 41.5 % (ref 36.0–46.0)
Hemoglobin: 13.8 g/dL (ref 12.0–15.0)
LYMPHS ABS: 1.5 10*3/uL (ref 0.7–4.0)
Lymphocytes Relative: 25.8 % (ref 12.0–46.0)
MCHC: 33.2 g/dL (ref 30.0–36.0)
MCV: 90.5 fl (ref 78.0–100.0)
MONO ABS: 0.5 10*3/uL (ref 0.1–1.0)
MONOS PCT: 8.6 % (ref 3.0–12.0)
NEUTROS ABS: 3.5 10*3/uL (ref 1.4–7.7)
NEUTROS PCT: 61.8 % (ref 43.0–77.0)
PLATELETS: 207 10*3/uL (ref 150.0–400.0)
RBC: 4.58 Mil/uL (ref 3.87–5.11)
RDW: 13.4 % (ref 11.5–15.5)
WBC: 5.7 10*3/uL (ref 4.0–10.5)

## 2015-07-16 LAB — LIPID PANEL
Cholesterol: 222 mg/dL — ABNORMAL HIGH (ref 0–200)
HDL: 93 mg/dL (ref >39.00)
LDL Cholesterol: 116 mg/dL — ABNORMAL HIGH (ref 0–99)
NONHDL: 128.93
Total CHOL/HDL Ratio: 2
Triglycerides: 67 mg/dL (ref 0.0–149.0)
VLDL: 13.4 mg/dL (ref 0.0–40.0)

## 2015-07-16 LAB — URINALYSIS, ROUTINE W REFLEX MICROSCOPIC
BILIRUBIN URINE: NEGATIVE
Hgb urine dipstick: NEGATIVE
Ketones, ur: NEGATIVE
LEUKOCYTES UA: NEGATIVE
Nitrite: NEGATIVE
PH: 6 (ref 5.0–8.0)
RBC / HPF: NONE SEEN (ref 0–?)
Specific Gravity, Urine: >=1.03 — AB (ref 1.000–1.030)
TOTAL PROTEIN, URINE-UPE24: NEGATIVE
Urine Glucose: NEGATIVE
Urobilinogen, UA: 0.2 (ref 0.0–1.0)
WBC, UA: NONE SEEN (ref 0–?)

## 2015-07-16 LAB — BASIC METABOLIC PANEL
BUN: 20 mg/dL (ref 6–23)
CALCIUM: 9.9 mg/dL (ref 8.4–10.5)
CO2: 29 meq/L (ref 19–32)
CREATININE: 0.89 mg/dL (ref 0.40–1.20)
Chloride: 104 mEq/L (ref 96–112)
GFR: 66.74 mL/min (ref >60.00)
GLUCOSE: 99 mg/dL (ref 70–99)
Potassium: 4.3 mEq/L (ref 3.5–5.1)
Sodium: 140 mEq/L (ref 135–145)

## 2015-07-16 LAB — HEPATIC FUNCTION PANEL
ALBUMIN: 4.2 g/dL (ref 3.5–5.2)
ALK PHOS: 56 U/L (ref 39–117)
ALT: 12 U/L (ref 0–35)
AST: 16 U/L (ref 0–37)
Bilirubin, Direct: 0.1 mg/dL (ref 0.0–0.3)
TOTAL PROTEIN: 6.8 g/dL (ref 6.0–8.3)
Total Bilirubin: 0.6 mg/dL (ref 0.2–1.2)

## 2015-07-16 LAB — VITAMIN D 25 HYDROXY (VIT D DEFICIENCY, FRACTURES): VITD: 32.69 ng/mL (ref 30.00–100.00)

## 2015-07-16 LAB — IBC PANEL
Iron: 91 ug/dL (ref 42–145)
SATURATION RATIOS: 26.2 % (ref 20.0–50.0)
TRANSFERRIN: 248 mg/dL (ref 212.0–360.0)

## 2015-07-16 LAB — TSH: TSH: 2.28 u[IU]/mL (ref 0.35–4.50)

## 2015-07-16 LAB — HEPATITIS C ANTIBODY: HCV Ab: NEGATIVE

## 2015-07-16 MED ORDER — ESTRADIOL-NORETHINDRONE ACET 1-0.5 MG PO TABS: 1.0000 | ORAL_TABLET | Freq: Every day | ORAL | Status: DC

## 2015-07-16 MED ORDER — TERBINAFINE HCL 250 MG PO TABS: 250.0000 mg | ORAL_TABLET | Freq: Every day | ORAL | Status: DC

## 2015-07-16 MED ORDER — TRETINOIN 0.05 % EX CREA: TOPICAL_CREAM | Freq: Every day | CUTANEOUS | Status: DC

## 2015-07-16 MED ORDER — TRAMADOL HCL 50 MG PO TABS: 50.0000 mg | ORAL_TABLET | Freq: Three times a day (TID) | ORAL | Status: DC | PRN

## 2015-07-16 MED ORDER — ALPRAZOLAM 0.25 MG PO TABS: 0.2500 mg | ORAL_TABLET | Freq: Every evening | ORAL | Status: DC | PRN

## 2015-07-16 MED ORDER — DICLOFENAC SODIUM 75 MG PO TBEC: 75.0000 mg | DELAYED_RELEASE_TABLET | Freq: Two times a day (BID) | ORAL | Status: DC | PRN

## 2015-07-16 NOTE — Progress Notes (Signed)
Pre visit review using our clinic review tool, if applicable. No additional management support is needed unless otherwise documented below in the visit note. 

## 2015-07-16 NOTE — Progress Notes (Signed)
Subjective:    Patient ID: Valerie Phelps, female    DOB: 1946/05/30, 69 y.o.   MRN: [id number]  HPI  patient is here today for annual physical. Patient feels well and has no complaints. Also reviewed chronic medical conditions, interval events and current concerns   Past Medical History  Diagnosis Date  . 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 Atrial bigeminy     Present at slow heart rate  . Diastolic dysfunction     Stress echo, normal, November, 2012  . Abnormal CT scan, chest     Question of abnormal lymph nodes in the chest, biopsy was -2011, felt reactive adenopathy due to silicone implants (subsquently replaced)  . Ejection fraction     EF 60%, echo, November, 2012  . CAD (coronary artery disease)     Cardiac CT,09/2010, calcium score 61, , small nidus LAD and RCA, and a below the knee  . Right ventricle     Question of mild right ventricular enlargement  in 2011 /   right ventricular size seems normal  in November, 2012, there is no documented pulmonary hypertension.  . Breast cancer     DCIS  . Personal history of colonic polyps-1 cm hyperplastic splenic flexure 11/29/2012  . Osteopenia    Family History  Problem Relation Age of Onset  . Prostate cancer Father   . Breast cancer Maternal Aunt 75  . Breast cancer Paternal Grandmother 52  . Colon cancer Paternal Grandfather     rectal  . Osteoarthritis Sister   . Aneurysm Brother 49    brain, no intervention   Social History  Substance Use Topics  . Smoking status: Never Smoker   . Smokeless tobacco: Never Used  . Alcohol Use: 2.5 oz/week    5 drink(s) per week     Comment: occ    Review of Systems  Constitutional: Negative for fatigue and unexpected weight change.  HENT: Positive for hearing loss.   Respiratory: Negative for cough, shortness of breath and wheezing.   Cardiovascular: Negative for chest pain, palpitations and leg swelling.  Gastrointestinal:  Negative for nausea, abdominal pain and diarrhea.  Neurological: Negative for dizziness, weakness, light-headedness and headaches.  Psychiatric/Behavioral: Negative for dysphoric mood. The patient is not nervous/anxious.   All other systems reviewed and are negative.      Objective:    Physical Exam  Constitutional: She is oriented to person, place, and time. She appears well-developed and well-nourished. No distress.  HENT:  Head: Normocephalic and atraumatic.  Right Ear: External ear normal.  Left Ear: External ear normal.  Nose: Nose normal.  Mouth/Throat: Oropharynx is clear and moist. No oropharyngeal exudate.  B cerumen impaction, clear after irrigation  Eyes: EOM are normal. Pupils are equal, round, and reactive to light. Right eye exhibits no discharge. Left eye exhibits no discharge. No scleral icterus.  Neck: Normal range of motion. Neck supple. No JVD present. No tracheal deviation present. No thyromegaly present.  Cardiovascular: Normal rate, regular rhythm, normal heart sounds and intact distal pulses.  Exam reveals no friction rub.   No murmur heard. Pulmonary/Chest: Effort normal and breath sounds normal. No respiratory distress. She has no wheezes. She has no rales. She exhibits no tenderness.  Abdominal: Soft. Bowel sounds are normal. She exhibits no distension and no mass. There is no tenderness. There is no rebound and no guarding.  Genitourinary:  Defer to gyn  Musculoskeletal: Normal range  of motion.  No gross deformities  Lymphadenopathy:    She has no cervical adenopathy.  Neurological: She is alert and oriented to person, place, and time. She has normal reflexes. No cranial nerve deficit.  Skin: Skin is warm and dry. No rash noted. She is not diaphoretic. No erythema.  Psychiatric: She has a normal mood and affect. Her behavior is normal. Judgment and thought content normal.  Nursing note and vitals reviewed.   BP 134/78 mmHg  Pulse 57  Temp(Src) 97.9 F  (36.6 C) (Oral)  Ht 5\' 5"  (1.651 m)  Wt 127 lb 8 oz (57.834 kg)  BMI 21.22 kg/m2  SpO2 98% Wt Readings from Last 3 Encounters:  07/16/15 127 lb 8 oz (57.834 kg)  07/14/14 129 lb (58.514 kg)  03/20/14 126 lb (57.153 kg)    Lab Results  Component Value Date   WBC 5.2 07/16/2014   HGB 13.4 07/16/2014   HCT 40.4 07/16/2014   PLT 206.0 07/16/2014   GLUCOSE 80 07/16/2014   CHOL 200 07/16/2014   TRIG 54.0 07/16/2014   HDL 86.20 07/16/2014   LDLCALC 103* 07/16/2014   ALT 17 07/16/2014   AST 21 07/16/2014   NA 139 07/16/2014   K 5.0 07/16/2014   CL 106 07/16/2014   CREATININE 0.8 07/16/2014   BUN 17 07/16/2014   CO2 27 07/16/2014   TSH 3.29 07/16/2014   Procedure: wax removal, B Reason: wax impaction, B Risks and benefits of procedure discussed with the patient who agrees to proceed. Ear(s) irrigated with warm water. Large amount of wax removed. Instrumentation with metal ear loop was performed to accomplish wax removal. the patient tolerated procedure well.   Dg Bone Density  07/17/2014   Date of study: 07/17/2014 Exam: DUAL X-RAY ABSORPTIOMETRY (DXA) FOR BONE MINERAL DENSITY (BMD) Instrument: [employer] Requesting Provider: Dr. Asa Lente Indication: follow up for low BMD Comparison: 02/05/2010 Clinical data: Pt is a postmenopausal 69 y.o. female. On estrogen.  Results:  Lumbar spine (L1-L4) Femoral neck (FN)  T-score -1.3 RFN: -1.1 LFN: -1.4  BMD (g/cm2) 1.029 RFN: 0.880 LFN: 0.839  Change in BMD from previous DXA test (*) -1.2% The total mean hip BMD  decreased by 5.7%*  (*) statistically significant  Assessment: the BMD is low according to the [facility] classification for  osteoporosis (see below). Fracture risk: moderate FRAX score: 10 year major osteoporotic risk: 8.7%. 10 year hip fracture  risk: 1.2%. These are under the thresholds for treatment of 20% and 3%,  respectively. Comments: the technical quality of the study is good. Evaluation for secondary causes should be  considered if clinically  indicated.  Recommend optimizing calcium (1200 mg/day) and vitamin D (800 IU/day)  intake.  Followup: Repeat BMD is appropriate after 2 years.  WHO criteria for diagnosis of osteoporosis in postmenopausal women and in  men 53 y/o or older:  - normal: T-score -1.0 to + 1.0 - osteopenia/low bone density: T-score between -2.5 and -1.0 - osteoporosis: T-score below -2.5 - severe osteoporosis: T-score below -2.5 with history of fragility  fracture Note: although not part of the WHO classification, the presence of a  fragility fracture, regardless of the T-score, should be considered  diagnostic of osteoporosis, provided other causes for the fracture have  been excluded.  Treatment: The National Osteoporosis Foundation recommends that treatment  be considered in postmenopausal women and men age 63 or older with: 1. Hip or vertebral (clinical or morphometric) fracture 2. T-score of - 2.5 or lower at  the spine or hip 3. 10-year fracture probability by FRAX of at least 20% for a major  osteoporotic fracture and 3% for a hip fracture  Philemon Kingdom, MD [facility]          Assessment & Plan:   CPX/z00.00 - Patient has been counseled on age-appropriate routine health concerns for screening and prevention. These are reviewed and up-to-date. Immunizations are up-to-date or declined. Labs ordered and reviewed.  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 Bilateral cerumen impaction on exam. Irrigation today as above. If hearing problems persist in next several weeks, patient will contact us for referral to audiology for further evaluation as needed  Problem List Items Addressed This Visit    None    Visit Diagnoses    Routine general medical examination at a health care facility    -  Primary    Relevant Orders    Basic metabolic panel    CBC with Differential/Platelet    Hepatic function panel    Lipid panel    TSH     Urinalysis, Routine w reflex microscopic (not at [facility])    Vit D  25 hydroxy (rtn osteoporosis monitoring)    IBC panel    Screening for HIV without presence of risk factors        Relevant Orders    HIV antibody    Need for hepatitis C screening test        Relevant Orders    Hepatitis C antibody    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

## 2015-07-16 NOTE — Patient Instructions (Addendum)
It was good to see you today.  We have reviewed your prior records including labs and tests today  Health Maintenance reviewed - annual flu shot and Prevnar 13 immunization updated today. All other recommended immunizations and age-appropriate screenings are up-to-date.  Test(s) ordered today. Your results will be released to [place] (or called to you) after review, usually within 72hours after test completion. If any changes need to be made, you will be notified at that same time.  Your ears have been irrigated of wax today -let us know if continued hearing problems persist for referral to audiologist and hearing testing  Medications reviewed and updated, no changes recommended at this time. Refill on medication(s) as discussed today.  Please schedule followup in 12 months for annual exam and labs, call sooner if problems.  Health Maintenance Adopting a healthy lifestyle and getting preventive care can go a long way to promote health and wellness. Talk with your health care provider about what schedule of regular examinations is right for you. This is a good chance for you to check in with your provider about disease prevention and staying healthy. In between checkups, there are plenty of things you can do on your own. Experts have done a lot of research about which lifestyle changes and preventive measures are most likely to keep you healthy. Ask your health care provider for more information. WEIGHT AND DIET  Eat a healthy diet  Be sure to include plenty of vegetables, fruits, low-fat dairy products, and lean protein.  Do not eat a lot of foods high in solid fats, added sugars, or salt.  Get regular exercise. This is one of the most important things you can do for your health.  Most adults should exercise for at least 150 minutes each week. The exercise should increase your heart rate and make you sweat (moderate-intensity exercise).  Most adults should also do strengthening exercises  at least twice a week. This is in addition to the moderate-intensity exercise.  Maintain a healthy weight  Body mass index (BMI) is a measurement that can be used to identify possible weight problems. It estimates body fat based on height and weight. Your health care provider can help determine your BMI and help you achieve or maintain a healthy weight.  For females 32 years of age and older:   A BMI below 18.5 is considered underweight.  A BMI of 18.5 to 24.9 is normal.  A BMI of 25 to 29.9 is considered overweight.  A BMI of 30 and above is considered obese.  Watch levels of cholesterol and blood lipids  You should start having your blood tested for lipids and cholesterol at 69 years of age, then have this test every 5 years.  You may need to have your cholesterol levels checked more often if:  Your lipid or cholesterol levels are high.  You are older than 69 years of age.  You are at high risk for heart disease.  CANCER SCREENING   Lung Cancer  Lung cancer screening is recommended for adults 66-15 years old who are at high risk for lung cancer because of a history of smoking.  A yearly low-dose CT scan of the lungs is recommended for people who:  Currently smoke.  Have quit within the past 15 years.  Have at least a 30-pack-year history of smoking. A pack year is smoking an average of one pack of cigarettes a day for 1 year.  Yearly screening should continue until it has been 15  years since you quit.  Yearly screening should stop if you develop a health problem that would prevent you from having lung cancer treatment.  Breast Cancer  Practice breast self-awareness. This means understanding how your breasts normally appear and feel.  It also means doing regular breast self-exams. Let your health care provider know about any changes, no matter how small.  If you are in your 20s or 30s, you should have a clinical breast exam (CBE) by a health care provider every  1-3 years as part of a regular health exam.  If you are 9 or older, have a CBE every year. Also consider having a breast X-ray (mammogram) every year.  If you have a family history of breast cancer, talk to your health care provider about genetic screening.  If you are at high risk for breast cancer, talk to your health care provider about having an MRI and a mammogram every year.  Breast cancer gene (BRCA) assessment is recommended for women who have family members with BRCA-related cancers. BRCA-related cancers include:  Breast.  Ovarian.  Tubal.  Peritoneal cancers.  Results of the assessment will determine the need for genetic counseling and BRCA1 and BRCA2 testing. Cervical Cancer Routine pelvic examinations to screen for cervical cancer are no longer recommended for nonpregnant women who are considered low risk for cancer of the pelvic organs (ovaries, uterus, and vagina) and who do not have symptoms. A pelvic examination may be necessary if you have symptoms including those associated with pelvic infections. Ask your health care provider if a screening pelvic exam is right for you.   The Pap test is the screening test for cervical cancer for women who are considered at risk.  If you had a hysterectomy for a problem that was not cancer or a condition that could lead to cancer, then you no longer need Pap tests.  If you are older than 65 years, and you have had normal Pap tests for the past 10 years, you no longer need to have Pap tests.  If you have had past treatment for cervical cancer or a condition that could lead to cancer, you need Pap tests and screening for cancer for at least 20 years after your treatment.  If you no longer get a Pap test, assess your risk factors if they change (such as having a new sexual partner). This can affect whether you should start being screened again.  Some women have medical problems that increase their chance of getting cervical cancer. If  this is the case for you, your health care provider may recommend more frequent screening and Pap tests.  The human papillomavirus (HPV) test is another test that may be used for cervical cancer screening. The HPV test looks for the virus that can cause cell changes in the cervix. The cells collected during the Pap test can be tested for HPV.  The HPV test can be used to screen women 18 years of age and older. Getting tested for HPV can extend the interval between normal Pap tests from three to five years.  An HPV test also should be used to screen women of any age who have unclear Pap test results.  After 69 years of age, women should have HPV testing as often as Pap tests.  Colorectal Cancer  This type of cancer can be detected and often prevented.  Routine colorectal cancer screening usually begins at 69 years of age and continues through 69 years of age.  Your health care  provider may recommend screening at an earlier age if you have risk factors for colon cancer.  Your health care provider may also recommend using home test kits to check for hidden blood in the stool.  A small camera at the end of a tube can be used to examine your colon directly (sigmoidoscopy or colonoscopy). This is done to check for the earliest forms of colorectal cancer.  Routine screening usually begins at age 72.  Direct examination of the colon should be repeated every 5-10 years through 69 years of age. However, you may need to be screened more often if early forms of precancerous polyps or small growths are found. Skin Cancer  Check your skin from head to toe regularly.  Tell your health care provider about any new moles or changes in moles, especially if there is a change in a mole's shape or color.  Also tell your health care provider if you have a mole that is larger than the size of a pencil eraser.  Always use sunscreen. Apply sunscreen liberally and repeatedly throughout the day.  Protect  yourself by wearing long sleeves, pants, a wide-brimmed hat, and sunglasses whenever you are outside. HEART DISEASE, DIABETES, AND HIGH BLOOD PRESSURE   Have your blood pressure checked at least every 1-2 years. High blood pressure causes heart disease and increases the risk of stroke.  If you are between 90 years and 17 years old, ask your health care provider if you should take aspirin to prevent strokes.  Have regular diabetes screenings. This involves taking a blood sample to check your fasting blood sugar level.  If you are at a normal weight and have a low risk for diabetes, have this test once every three years after 69 years of age.  If you are overweight and have a high risk for diabetes, consider being tested at a younger age or more often. PREVENTING INFECTION  Hepatitis B  If you have a higher risk for hepatitis B, you should be screened for this virus. You are considered at high risk for hepatitis B if:  You were born in a country where hepatitis B is common. Ask your health care provider which countries are considered high risk.  Your parents were born in a high-risk country, and you have not been immunized against hepatitis B (hepatitis B vaccine).  You have HIV or AIDS.  You use needles to inject street drugs.  You live with someone who has hepatitis B.  You have had sex with someone who has hepatitis B.  You get hemodialysis treatment.  You take certain medicines for conditions, including cancer, organ transplantation, and autoimmune conditions. Hepatitis C  Blood testing is recommended for:  Everyone born from [birth year] through 1965.  Anyone with known risk factors for hepatitis C. Sexually transmitted infections (STIs)  You should be screened for sexually transmitted infections (STIs) including gonorrhea and chlamydia if:  You are sexually active and are younger than 69 years of age.  You are older than 69 years of age and your health care provider tells you  that you are at risk for this type of infection.  Your sexual activity has changed since you were last screened and you are at an increased risk for chlamydia or gonorrhea. Ask your health care provider if you are at risk.  If you do not have HIV, but are at risk, it may be recommended that you take a prescription medicine daily to prevent HIV infection. This is called pre-exposure prophylaxis (  PrEP). You are considered at risk if:  You are sexually active and do not regularly use condoms or know the HIV status of your partner(s).  You take drugs by injection.  You are sexually active with a partner who has HIV. Talk with your health care provider about whether you are at high risk of being infected with HIV. If you choose to begin PrEP, you should first be tested for HIV. You should then be tested every 3 months for as long as you are taking PrEP.  PREGNANCY   If you are premenopausal and you may become pregnant, ask your health care provider about preconception counseling.  If you may become pregnant, take 400 to 800 micrograms (mcg) of folic acid every day.  If you want to prevent pregnancy, talk to your health care provider about birth control (contraception). OSTEOPOROSIS AND MENOPAUSE   Osteoporosis is a disease in which the bones lose minerals and strength with aging. This can result in serious bone fractures. Your risk for osteoporosis can be identified using a bone density scan.  If you are 37 years of age or older, or if you are at risk for osteoporosis and fractures, ask your health care provider if you should be screened.  Ask your health care provider whether you should take a calcium or vitamin D supplement to lower your risk for osteoporosis.  Menopause may have certain physical symptoms and risks.  Hormone replacement therapy may reduce some of these symptoms and risks. Talk to your health care provider about whether hormone replacement therapy is right for you.  HOME  CARE INSTRUCTIONS   Schedule regular health, dental, and eye exams.  Stay current with your immunizations.   Do not use any tobacco products including cigarettes, chewing tobacco, or electronic cigarettes.  If you are pregnant, do not drink alcohol.  If you are breastfeeding, limit how much and how often you drink alcohol.  Limit alcohol intake to no more than 1 drink per day for nonpregnant women. One drink equals 12 ounces of beer, 5 ounces of wine, or 1 ounces of hard liquor.  Do not use street drugs.  Do not share needles.  Ask your health care provider for help if you need support or information about quitting drugs.  Tell your health care provider if you often feel depressed.  Tell your health care provider if you have ever been abused or do not feel safe at home. Document Released: 05/23/2011 Document Revised: 03/24/2014 Document Reviewed: 10/09/2013 [facility] Patient Information 2015 [place], [place]. This information is not intended to replace advice given to you by your health care provider. Make sure you discuss any questions you have with your health care provider.

## 2015-07-17 LAB — HIV ANTIBODY (ROUTINE TESTING W REFLEX): HIV: NONREACTIVE

## 2015-07-24 ENCOUNTER — Telehealth: Payer: Self-pay

## 2015-07-24 NOTE — Telephone Encounter (Signed)
PA received and submitted via CoverMyMeds on the LOV:   Resent PA several times.  Pharmacy contacted: Pharmacist stated that pt has paid for and picked up rx.  Plan is denying due to max age of 87 for this drug.

## 2015-08-12 ENCOUNTER — Other Ambulatory Visit: Payer: Self-pay | Admitting: Sports Medicine

## 2015-08-13 ENCOUNTER — Other Ambulatory Visit: Payer: Self-pay | Admitting: Sports Medicine

## 2015-08-14 ENCOUNTER — Telehealth: Payer: Self-pay | Admitting: *Deleted

## 2015-08-14 NOTE — Telephone Encounter (Signed)
Received refill request for tramadol, called in verbal order to [facility]

## 2015-09-11 ENCOUNTER — Encounter: Payer: Self-pay | Admitting: Internal Medicine

## 2015-11-24 ENCOUNTER — Telehealth: Payer: Self-pay | Admitting: Internal Medicine

## 2015-11-24 NOTE — Telephone Encounter (Signed)
Patient is requesting that we get a PA for estradiol-norethindrone (ACTIVELLA) 1-0.5 MG per tablet [id number] . She states that she got a letter stating that it will need a PA. She uses [facility]

## 2015-11-26 MED FILL — AMOXICILLIN 500 MG CAPSULE: 500 | 7 days supply | Qty: 21 | Fill #0

## 2015-11-26 MED FILL — HYDROCODON-APAP 7.5-325: 7.5-325 | 4 days supply | Qty: 20 | Fill #0

## 2015-11-27 ENCOUNTER — Telehealth: Payer: Self-pay

## 2015-11-27 NOTE — Telephone Encounter (Signed)
Called patient unable to reach, need new insurance information.

## 2015-12-02 DIAGNOSIS — K000 Anodontia: Secondary | ICD-10-CM | POA: Diagnosis not present

## 2015-12-03 ENCOUNTER — Telehealth: Payer: Self-pay

## 2015-12-03 NOTE — Telephone Encounter (Signed)
PA initiated via covermymeds. Key for PA is [id number]

## 2015-12-04 NOTE — Telephone Encounter (Signed)
Ref  # [id number]  Valerie Phelps is calling to ask some questions for approving activella. She also wanted to add that this is a time sensitive matter and will require an appeal. Time frame is 40 hours at this point

## 2015-12-07 NOTE — Telephone Encounter (Signed)
Called [employer] and clinical indications for using Activella.

## 2015-12-11 NOTE — Telephone Encounter (Signed)
Faxed approved pa to [facility]

## 2015-12-11 NOTE — Telephone Encounter (Signed)
PA approved. Faxed info to [facility].   Left message for pt to call back.   RE: PA approved and should be ready at pharmacy.

## 2016-01-11 DIAGNOSIS — F419 Anxiety disorder, unspecified: Secondary | ICD-10-CM | POA: Diagnosis not present

## 2016-01-11 DIAGNOSIS — M1990 Unspecified osteoarthritis, unspecified site: Secondary | ICD-10-CM | POA: Diagnosis not present

## 2016-01-11 DIAGNOSIS — Z0000 Encounter for general adult medical examination without abnormal findings: Secondary | ICD-10-CM | POA: Diagnosis not present

## 2016-01-11 DIAGNOSIS — Z6820 Body mass index (BMI) 20.0-20.9, adult: Secondary | ICD-10-CM | POA: Diagnosis not present

## 2016-01-11 DIAGNOSIS — R232 Flushing: Secondary | ICD-10-CM | POA: Diagnosis not present

## 2016-01-11 DIAGNOSIS — R32 Unspecified urinary incontinence: Secondary | ICD-10-CM | POA: Diagnosis not present

## 2016-01-11 DIAGNOSIS — E782 Mixed hyperlipidemia: Secondary | ICD-10-CM | POA: Diagnosis not present

## 2016-01-11 DIAGNOSIS — F1320 Sedative, hypnotic or anxiolytic dependence, uncomplicated: Secondary | ICD-10-CM | POA: Diagnosis not present

## 2016-01-11 DIAGNOSIS — Z791 Long term (current) use of non-steroidal anti-inflammatories (NSAID): Secondary | ICD-10-CM | POA: Diagnosis not present

## 2016-02-02 ENCOUNTER — Telehealth: Payer: Self-pay | Admitting: *Deleted

## 2016-02-02 DIAGNOSIS — I351 Nonrheumatic aortic (valve) insufficiency: Secondary | ICD-10-CM

## 2016-02-02 NOTE — Telephone Encounter (Signed)
-----   Message from Dorothy Spark, MD sent at 01/31/2016 10:12 AM EDT ----- Valerie Phelps, Please schedule Dr Olevia Perches (she is wife of Eustace Quail, our former cardiologist - a great one!!!) for an echocardiogram for aortic regurgitation. She is a former Dr Ron Parker patient and needs follow up echo sometimes now and follow up appointment - not urgent - next available.  Thank you, K

## 2016-02-02 NOTE — Telephone Encounter (Signed)
Per Karlene Einstein request - Dr. Delfin Edis has been contacted via home & cell with vm left to call the office back to ask for me to scheduled echo and office visit notation in patient options .      Valerie Phelps

## 2016-02-02 NOTE — Telephone Encounter (Signed)
Order for the pt to have a follow-up echo for aortic regurgitation per Dr Meda Coffee entered in the system.  Scheduling aware to call the pt and schedule this echo for sometime now and schedule a follow-up appt with Dr Meda Coffee thereafter. Scheduling will call the pt with the appt date and time.

## 2016-02-03 ENCOUNTER — Ambulatory Visit (HOSPITAL_COMMUNITY): Payer: PPO | Attending: Cardiology

## 2016-02-03 ENCOUNTER — Other Ambulatory Visit: Payer: Self-pay

## 2016-02-03 DIAGNOSIS — I340 Nonrheumatic mitral (valve) insufficiency: Secondary | ICD-10-CM | POA: Insufficient documentation

## 2016-02-03 DIAGNOSIS — I493 Ventricular premature depolarization: Secondary | ICD-10-CM | POA: Insufficient documentation

## 2016-02-03 DIAGNOSIS — I517 Cardiomegaly: Secondary | ICD-10-CM | POA: Diagnosis not present

## 2016-02-03 DIAGNOSIS — I351 Nonrheumatic aortic (valve) insufficiency: Secondary | ICD-10-CM | POA: Diagnosis not present

## 2016-02-03 NOTE — Telephone Encounter (Signed)
Dr. Delfin Edis has echo schedule for today @ 4 pm . Pre-cert is not needed for the pts insurance doesn't require this for this type of test per Pre-cert dept Rep, Dorian Pod.     Dr. Meda Coffee / [facility] - follow up appt is 4/24 @ 11 am    Pt aware of appt dates and times and agrees with this plan.

## 2016-02-08 ENCOUNTER — Other Ambulatory Visit: Payer: Self-pay | Admitting: Internal Medicine

## 2016-02-09 MED FILL — ESTRADIOL-NORETH 1.0-0.5 MG: 1-0.5 | 84 days supply | Qty: 84 | Fill #0

## 2016-03-10 ENCOUNTER — Other Ambulatory Visit (INDEPENDENT_AMBULATORY_CARE_PROVIDER_SITE_OTHER): Payer: PPO

## 2016-03-10 ENCOUNTER — Ambulatory Visit (INDEPENDENT_AMBULATORY_CARE_PROVIDER_SITE_OTHER): Payer: PPO | Admitting: Sports Medicine

## 2016-03-10 ENCOUNTER — Encounter: Payer: Self-pay | Admitting: Internal Medicine

## 2016-03-10 ENCOUNTER — Ambulatory Visit (INDEPENDENT_AMBULATORY_CARE_PROVIDER_SITE_OTHER): Payer: PPO | Admitting: Internal Medicine

## 2016-03-10 VITALS — BP 140/80 | Ht 65.0 in | Wt 125.0 lb

## 2016-03-10 VITALS — BP 142/90 | HR 52 | Temp 97.7°F | Resp 16 | Wt 127.0 lb

## 2016-03-10 DIAGNOSIS — M8580 Other specified disorders of bone density and structure, unspecified site: Secondary | ICD-10-CM | POA: Diagnosis not present

## 2016-03-10 DIAGNOSIS — M2550 Pain in unspecified joint: Secondary | ICD-10-CM

## 2016-03-10 DIAGNOSIS — M25562 Pain in left knee: Secondary | ICD-10-CM | POA: Diagnosis not present

## 2016-03-10 DIAGNOSIS — F4321 Adjustment disorder with depressed mood: Secondary | ICD-10-CM | POA: Diagnosis not present

## 2016-03-10 DIAGNOSIS — B351 Tinea unguium: Secondary | ICD-10-CM | POA: Diagnosis not present

## 2016-03-10 DIAGNOSIS — F341 Dysthymic disorder: Secondary | ICD-10-CM | POA: Insufficient documentation

## 2016-03-10 DIAGNOSIS — M25569 Pain in unspecified knee: Secondary | ICD-10-CM | POA: Insufficient documentation

## 2016-03-10 DIAGNOSIS — M67912 Unspecified disorder of synovium and tendon, left shoulder: Secondary | ICD-10-CM

## 2016-03-10 LAB — COMPREHENSIVE METABOLIC PANEL
ALBUMIN: 4.1 g/dL (ref 3.5–5.2)
ALK PHOS: 63 U/L (ref 39–117)
ALT: 13 U/L (ref 0–35)
AST: 15 U/L (ref 0–37)
BILIRUBIN TOTAL: 0.5 mg/dL (ref 0.2–1.2)
BUN: 18 mg/dL (ref 6–23)
CO2: 30 mEq/L (ref 19–32)
Calcium: 9.8 mg/dL (ref 8.4–10.5)
Chloride: 103 mEq/L (ref 96–112)
Creatinine, Ser: 0.77 mg/dL (ref 0.40–1.20)
GFR: 78.73 mL/min (ref >60.00)
GLUCOSE: 97 mg/dL (ref 70–99)
POTASSIUM: 4.6 meq/L (ref 3.5–5.1)
SODIUM: 138 meq/L (ref 135–145)
TOTAL PROTEIN: 6.9 g/dL (ref 6.0–8.3)

## 2016-03-10 LAB — CBC WITH DIFFERENTIAL/PLATELET
BASOS ABS: 0 10*3/uL (ref 0.0–0.1)
Basophils Relative: 0.7 % (ref 0.0–3.0)
EOS PCT: 4.7 % (ref 0.0–5.0)
Eosinophils Absolute: 0.2 10*3/uL (ref 0.0–0.7)
HCT: 39.4 % (ref 36.0–46.0)
HEMOGLOBIN: 13.2 g/dL (ref 12.0–15.0)
LYMPHS ABS: 1.1 10*3/uL (ref 0.7–4.0)
Lymphocytes Relative: 28.6 % (ref 12.0–46.0)
MCHC: 33.6 g/dL (ref 30.0–36.0)
MCV: 88.6 fl (ref 78.0–100.0)
MONO ABS: 0.5 10*3/uL (ref 0.1–1.0)
Monocytes Relative: 11.7 % (ref 3.0–12.0)
NEUTROS PCT: 54.3 % (ref 43.0–77.0)
Neutro Abs: 2.1 10*3/uL (ref 1.4–7.7)
Platelets: 197 10*3/uL (ref 150.0–400.0)
RBC: 4.45 Mil/uL (ref 3.87–5.11)
RDW: 13 % (ref 11.5–15.5)
WBC: 3.9 10*3/uL — AB (ref 4.0–10.5)

## 2016-03-10 LAB — RHEUMATOID FACTOR: Rhuematoid fact SerPl-aCnc: 12 IU/mL (ref <=14)

## 2016-03-10 LAB — C-REACTIVE PROTEIN: CRP: 1.1 mg/dL (ref 0.5–20.0)

## 2016-03-10 LAB — SEDIMENTATION RATE: Sed Rate: 5 mm/hr (ref 0–22)

## 2016-03-10 MED ORDER — ALPRAZOLAM 0.25 MG PO TABS: 0.2500 mg | ORAL_TABLET | Freq: Every evening | ORAL | Status: DC | PRN

## 2016-03-10 MED ORDER — DICLOFENAC SODIUM 75 MG PO TBEC: 75.0000 mg | DELAYED_RELEASE_TABLET | Freq: Two times a day (BID) | ORAL | Status: DC | PRN

## 2016-03-10 MED ORDER — BUPROPION HCL ER (XL) 150 MG PO TB24: 150.0000 mg | ORAL_TABLET | Freq: Every day | ORAL | Status: DC

## 2016-03-10 MED ORDER — TERBINAFINE HCL 250 MG PO TABS: 250.0000 mg | ORAL_TABLET | Freq: Every day | ORAL | Status: DC

## 2016-03-10 MED ORDER — TRAMADOL HCL 50 MG PO TABS: 50.0000 mg | ORAL_TABLET | Freq: Three times a day (TID) | ORAL | Status: DC | PRN

## 2016-03-10 MED FILL — TERBINAFINE HCL 250 MG TAB: 250 | 90 days supply | Qty: 90 | Fill #0

## 2016-03-10 MED FILL — DICLOFENAC SOD EC 75 MG TAB: 75 | 90 days supply | Qty: 180 | Fill #0

## 2016-03-10 MED FILL — traMADol HCL 50 MG TABS: 50 | 10 days supply | Qty: 30 | Fill #0

## 2016-03-10 MED FILL — ALPRAZolam 0.25 MG TABS: 0.25 | 30 days supply | Qty: 30 | Fill #0

## 2016-03-10 MED FILL — BUPROPION HCL XL 150 MG TAB: 150 | 90 days supply | Qty: 90 | Fill #0

## 2016-03-10 NOTE — Patient Instructions (Signed)
  Test(s) ordered today. Your results will be released to [place] (or called to you) after review, usually within 72hours after test completion. If any changes need to be made, you will be notified at that same time.  All other Health Maintenance issues reviewed.   All recommended immunizations and age-appropriate screenings are up-to-date or discussed.  No immunizations administered today.   Medications reviewed and updated.  Changes include trying wellbutrin.  If you have any questions please let me know.   Your prescription(s) have been submitted to your pharmacy. Please take as directed and contact our office if you believe you are having problem(s) with the medication(s).   Please followup in one year.

## 2016-03-10 NOTE — Assessment & Plan Note (Signed)
This may well be a stretching injury Not usually assoc w running  Compression Extender exercises Calf raises  Gradually ease back into running  Reck if persistent

## 2016-03-10 NOTE — Patient Instructions (Signed)
You have an old supraspinatus tear with a calcification OK to continue weiight lifting but no over head lifts Exercises with light weight are Spokes of wheel Internal and external rotation 3 sets of 10  You also have some AC joint swelling Not much arthritis - something you lifted wrong Ice this  Keep up scapular exercises  Behind the knee you have semimembranosus- gastrocnemius bursitis Start back on calf raises on a step Ease up on stretching Start and easy Hmastring exercise - extender  See how these do  When it is not painful you can run - this is not a running injury

## 2016-03-10 NOTE — Assessment & Plan Note (Signed)
She is reaggravating with her current tpe of lifting  [facility] rehab series below 90 deg  Avoid cross over  Ice the [facility] joint  Reck pending sxs

## 2016-03-10 NOTE — Progress Notes (Signed)
Pre visit review using our clinic review tool, if applicable. No additional management support is needed unless otherwise documented below in the visit note. 

## 2016-03-10 NOTE — Assessment & Plan Note (Signed)
dexa up to date - repeat after three years Continue cal, vitamin D Running daily

## 2016-03-10 NOTE — Progress Notes (Addendum)
Subjective:    Patient ID: Valerie Phelps, female    DOB: 1946/06/13, 70 y.o.   MRN: [id number]  HPI She is here to establish with a new pcp.    She retired last year and is going through the adjustment period.  She misses some aspects of work.   Depression, adjustment disorder:  She thinks she may have mild depression.  She denies anxiety.  She understands the big life change she has gone through and she does miss some aspects of work.  She has been on lexapro in the past and wonders if she should try something now.  She wants something low dose or mild.   Osteopenia: her dexa is up to date.  She is taking calcium and vitamin D.  She runs daily.   Mild CAD, AI:  She is following with cardiology. She runs daily and is asymptomatic. She denies chest pain, shortness of breath, leg swelling. She has occasional palpitations at night.  Onychomycosis, toenails: She has been taking oral Lamisil prescribed by her dermatologist. This has helped and she would like to continue it for another 6 weeks. She would like a refill today.  Joint pain: She runs daily and does have some injuries from new. She also has rotator cuff injury in her left shoulder. She is following with a sports medicine doctor. She has gotten older she does get joint pain in different places associated with some joint swelling. Takes diclofenac as needed and tramadol on a rare occasion. She was concerned about possibility of an autoimmune arthritic disease, but blood work to evaluate that.    Medications and allergies reviewed with patient and updated if appropriate.  Patient Active Problem List   Diagnosis Date Noted  . Aortic regurgitation 02/02/2016  . Osteopenia   . Achilles tendinitis of both lower extremities 12/05/2013  . Medial epicondylitis of left elbow 10/08/2013  . Personal history of colonic polyps-1 cm hyperplastic splenic flexure 11/29/2012  . Breast cancer ([place])   . Right ventricle   . CAD (coronary artery  disease)   . Atrial bigeminy   . Aortic insufficiency   . Diastolic dysfunction   . Abnormal CT scan, chest   . Ejection fraction   . HIP PAIN, RIGHT 07/02/2010  . ADENOCARCINOMA, BREAST, ER POSITIVE 08/28/2009    Current Outpatient Prescriptions on File Prior to Visit  Medication Sig Dispense Refill  . ALPRAZolam (XANAX) 0.25 MG tablet Take 1 tablet (0.25 mg total) by mouth at bedtime as needed for anxiety. 90 tablet 1  . diclofenac (VOLTAREN) 75 MG EC tablet Take 1 tablet (75 mg total) by mouth 2 (two) times daily as needed for mild pain. 180 tablet 1  . estradiol-norethindrone (ACTIVELLA) 1-0.5 MG tablet Take 1 tablet by mouth daily. Keep appt w/new PCP for future refills 84 tablet 0  . terbinafine (LAMISIL) 250 MG tablet Take 1 tablet (250 mg total) by mouth daily. 90 tablet 0  . traMADol (ULTRAM) 50 MG tablet TAKE ONE TABLET BY MOUTH EVERY 8 HOURS AS NEEDED 30 tablet 1  . tretinoin (RETIN-A) 0.05 % cream Apply topically at bedtime. 3 times per week. 45 g 0   No current facility-administered medications on file prior to visit.    Past Medical History  Diagnosis Date  . 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 Atrial bigeminy     Present at slow heart rate  . Diastolic dysfunction  Stress echo, normal, November, 2012  . Abnormal CT scan, chest     Question of abnormal lymph nodes in the chest, biopsy was -2011, felt reactive adenopathy due to silicone implants (subsquently replaced)  . Ejection fraction     EF 60%, echo, November, 2012  . CAD (coronary artery disease)     Cardiac CT,09/2010, calcium score 61, , small nidus LAD and RCA, and a below the knee  . Right ventricle     Question of mild right ventricular enlargement  in 2011 /   right ventricular size seems normal  in November, 2012, there is no documented pulmonary hypertension.  . Breast cancer     DCIS  . Personal history of colonic polyps-1 cm hyperplastic splenic  flexure 11/29/2012  . Osteopenia     Past Surgical History  Procedure Laterality Date  . Oophorectomy  1982    rt  . Mouth surgery    . Lymph node biopsy    . Varicose vein injections    . Breast surgery  1995    rt and lt total mastectomies-reconstruction  . Breast reconstruction  [phone]    implants exchanged  . Facial cosmetic surgery    . Colonoscopy      Social History   Social History  . Marital Status: Married    Spouse Name: Darnell Level  . Number of Children: 3  . Years of Education: 25   Occupational History  . 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 Smoking status: Never Smoker   . Smokeless tobacco: Never Used  . Alcohol Use: 2.5 oz/week    5 drink(s) per week     Comment: occ  . Drug Use: No  . Sexual Activity:    Partners: Male    Birth Control/ Protection: Post-menopausal   Other Topics Concern  . Not on file   Social History Narrative   [employer], [place]. Married - '71- 7 years/divorced; Married '86. 1 son - '85; 2 step-children. 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 ACP/end of life - full resiscitation, reasonable duration mechanical ventilatory support, no heroic/futile measures. HCPOA - spouse    Family History  Problem Relation Age of Onset  . Prostate cancer Father   . Breast cancer Maternal Aunt 30  . Breast cancer Paternal Grandmother 55  . Colon cancer Paternal Grandfather     rectal  . Osteoarthritis Sister   . Aneurysm Brother 62    brain, no intervention    Review of Systems  Constitutional: Negative for fever.  Respiratory: Negative for cough, shortness of breath and wheezing.   Cardiovascular: Positive for palpitations. Negative for chest pain and leg swelling.  Neurological: Negative for light-headedness and headaches.  Psychiatric/Behavioral: Positive for dysphoric mood (mild). The patient is not nervous/anxious.        Objective:    Filed Vitals:   03/10/16 0824  BP: 142/90  Pulse: 52  Temp: 97.7 F (36.5 C)  Resp: 16   Filed Weights   03/10/16 0824  Weight: 127 lb (57.607 kg)   Body mass index is 21.13 kg/(m^2).   Physical Exam Constitutional: Appears well-developed and well-nourished. No distress.  Neck: Neck supple. No tracheal deviation present. No thyromegaly present.  No carotid bruit. No cervical adenopathy.   Cardiovascular: Normal rate, regular rhythm and normal heart sounds.   No murmur heard.  No edema Pulmonary/Chest: Effort normal and breath sounds normal. No  respiratory distress. No wheezes.  Psych: mild depressed affect, normal behavior       Assessment & Plan:   See Problem List for Assessment and Plan of chronic medical problems.   Follow up annually

## 2016-03-10 NOTE — Assessment & Plan Note (Signed)
Improved with oral lamisil - will continue Check cmp

## 2016-03-10 NOTE — Assessment & Plan Note (Signed)
Check ana, rf, esr, crp Continue diclofenac as needed - does not take regularly Continue tramadol only as needed - does not take often Seeing sports medicine specialist

## 2016-03-10 NOTE — Progress Notes (Signed)
Patient ID: Valerie Phelps, female   DOB: 05-Mar-1946, 70 y.o.   MRN: [id number]  CC: left shoulder pain and left knee pain  Patient hs Hx of RCT by MRI in left shoulder Evaluated by Dr Onnie Graham Responded to PT Now retired and doing regular weight work This includes overhead lifts Starting to get some crunching and mild pain  Left knee pain started over past month Feels tight and sometimes uncomfortable with running in post knee Felt better after biking Does not rmember an injury Left calf has had tear as well as left chronic AT problems Does vigorous stretches  Of calf Not unstable  Past hx; reviewed and includes breast cancer and some cardiac issues but all these problmes are stable  ROS No joint swelling No locking  Pexam [place] in NAD BP 140/80 mmHg  Ht 5\' 5"  (1.651 m)  Wt 125 lb (56.7 kg)  BMI 20.80 kg/m2  Left Shoulder: Inspection reveals no abnormalities, atrophy or asymmetry. Palpation is normal with no tenderness over AC joint or bicipital groove. ROM is full in all planes. Rotator cuff strength normal throughout. No signs of impingement with negative Neer and Hawkin's tests, empty can.- these tests do cause mild pain Speeds and Yergason's tests normal. No labral pathology noted with negative Obrien's, negative clunk and good stability. Normal scapular function observed. No painful arc and no drop arm sign. No apprehension sign  XOver test with mild pain in left AC  Left Knee Knee: Normal to inspection with no erythema or effusion or obvious bony abnormalities. Palpation normal with no warmth or joint line tenderness or patellar tenderness or condyle tenderness. ROM normal in flexion and extension and lower leg rotation. Ligaments with solid consistent endpoints including ACL, PCL, LCL, MCL. Negative Mcmurray's and provocative meniscal tests. Non painful patellar compression. Patellar and quadriceps tendons unremarkable. Hamstring and quadriceps  strength is normal.  While she has normal flexion - she gets pain on distal flexion of left knee and there is some fullness in  Pop space medially

## 2016-03-10 NOTE — Assessment & Plan Note (Signed)
Has been on lexapro in past and did not feel well Will try wellbutrin XL 150 mg daily If she does not feel this is effective or has side effects will try effexor low dose

## 2016-03-11 LAB — ANTI-NUCLEAR AB-TITER (ANA TITER)

## 2016-03-11 LAB — ANA: ANA: POSITIVE — AB

## 2016-03-12 ENCOUNTER — Encounter: Payer: Self-pay | Admitting: Internal Medicine

## 2016-03-14 ENCOUNTER — Ambulatory Visit (INDEPENDENT_AMBULATORY_CARE_PROVIDER_SITE_OTHER): Payer: PPO | Admitting: Cardiology

## 2016-03-14 ENCOUNTER — Encounter: Payer: Self-pay | Admitting: Cardiology

## 2016-03-14 VITALS — BP 120/78 | HR 57 | Ht 65.0 in | Wt 125.8 lb

## 2016-03-14 DIAGNOSIS — I2584 Coronary atherosclerosis due to calcified coronary lesion: Secondary | ICD-10-CM

## 2016-03-14 DIAGNOSIS — I351 Nonrheumatic aortic (valve) insufficiency: Secondary | ICD-10-CM | POA: Diagnosis not present

## 2016-03-14 DIAGNOSIS — E785 Hyperlipidemia, unspecified: Secondary | ICD-10-CM

## 2016-03-14 DIAGNOSIS — I2510 Atherosclerotic heart disease of native coronary artery without angina pectoris: Secondary | ICD-10-CM

## 2016-03-14 MED ORDER — RED YEAST RICE 600 MG PO TABS: 1.0000 | ORAL_TABLET | Freq: Every day | ORAL | Status: DC

## 2016-03-14 NOTE — Patient Instructions (Addendum)
Medication Instructions:  Your physician has recommended you make the following change in your medication:  Start red yeast rice 600 mg by mouth daily.   Labwork: Your physician recommends that you return for fasting lab work in: 2 months--. BMP, Lipid and liver profiles.  The lab opens at 7:30 AM every week day. --Scheduled for June 27,2017   Testing/Procedures: none  Follow-Up: Your physician wants you to follow-up in: 12 months.  You will receive a reminder letter in the mail two months in advance. If you don't receive a letter, please call our office to schedule the follow-up appointment.   Any Other Special Instructions Will Be Listed Below (If Applicable).     If you need a refill on your cardiac medications before your next appointment, please call your pharmacy.

## 2016-03-14 NOTE — Progress Notes (Signed)
Patient ID: Valerie Phelps, female   DOB: 26-Nov-1945, 70 y.o.   MRN: [id number]      Cardiology Office Note  Date:  03/14/2016   ID:  Valerie Phelps, DOB [birth year], MRN [id number]  PCP:  Gwendolyn Grant, MD  Cardiologist:   Dorothy Spark, MD, previously followed by Dr. Dola Argyle  Chief complain: follow up for aortic insufficiency   History of Present Illness: Valerie Phelps is a 69 y.o. female who presents for follow-up for aortic insufficiency. Dr. 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 She also has a history of symptomatic PACs and PVCs that were not treated in the past as she is bradycardic at baseline. At some of the older echo reports there was a statement about the right ventricular dilatation however on the most recent is normal.  There was question in 2011 of some increase in right ventricular size. Ultimately it was decided that there was no significant abnormality. There has been no evidence of pulmonary hypertension. There was no PFO or ASD by bubble study. Right ventricular size and function appear normal, echo, January, 2015.  Dr. Olevia Perches underwent cardiac CTA in 2011 that showed: 1) Calcium score 61 ( 75th percentile)  2) No significant obstructive CAD 3) Qualitatively normal EF 4) No aortic root dilatation with trileaflet AV 5) Suspicious soft tissue density in the LIMA station especially  Her most recent lipid panel in August 2016 showed LDL 116 HDL 93 and triglyceride of 67.  She remains very active, she runs 3 miles a day and approximately 20 miles a week on average. She denies any shortness of breath, chest pain, no orthopnea paroxysmal nocturnal dyspnea or lower extremity edema she feels her ectopy but it's not associated with shortness of breath or dizziness. No recent syncope.   Past  Medical History  Diagnosis Date  . 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 Atrial bigeminy     Present at slow heart rate  . Diastolic dysfunction     Stress echo, normal, November, 2012  . Abnormal CT scan, chest     Question of abnormal lymph nodes in the chest, biopsy was -2011, felt reactive adenopathy due to silicone implants (subsquently replaced)  . Ejection fraction     EF 60%, echo, November, 2012  . CAD (coronary artery disease)     Cardiac CT,09/2010, calcium score 61, , small nidus LAD and RCA, and a below the knee  . Right ventricle     Question of mild right ventricular enlargement  in 2011 /   right ventricular size seems normal  in November, 2012, there is no documented pulmonary hypertension.  . 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 Personal history of colonic polyps-1 cm hyperplastic splenic flexure 11/29/2012  . Osteopenia    Past Surgical History  Procedure Laterality Date  . Oophorectomy  1982    rt  . Mouth surgery    . Lymph node biopsy    . Varicose vein injections    . Breast surgery  1995    rt and lt total mastectomies-reconstruction  . Breast reconstruction  [phone]    implants exchanged  . Facial cosmetic surgery    . Colonoscopy     Current Outpatient Prescriptions  Medication Sig Dispense Refill  . ALPRAZolam (XANAX) 0.25 MG tablet  Take 1 tablet (0.25 mg total) by mouth at bedtime as needed for anxiety. 30 tablet 1  . buPROPion (WELLBUTRIN XL) 150 MG 24 hr tablet Take 1 tablet (150 mg total) by mouth daily. 90 tablet 3  . diclofenac (VOLTAREN) 75 MG EC tablet Take 1 tablet (75 mg total) by mouth 2 (two) times daily as needed for mild pain. 180 tablet 3  . estradiol-norethindrone (ACTIVELLA) 1-0.5 MG tablet Take 1 tablet by mouth daily. Keep appt w/new PCP for future refills 84 tablet 0  . terbinafine (LAMISIL) 250 MG tablet Take 1 tablet (250 mg total) by mouth daily. 90 tablet 0  . traMADol (ULTRAM) 50 MG  tablet Take 1 tablet (50 mg total) by mouth every 8 (eight) hours as needed. 30 tablet 1  . tretinoin (RETIN-A) 0.05 % cream Apply topically at bedtime. 3 times per week. 45 g 0   No current facility-administered medications for this visit.   Allergies:   Review of patient's allergies indicates no known allergies.   Social History:  The patient  reports that she has never smoked. She has never used smokeless tobacco. She reports that she drinks about 2.5 oz of alcohol per week. She reports that she does not use illicit drugs.   Family History:  The patient's family history includes Aneurysm (age of onset: 71) in her brother; Breast cancer (age of onset: 63) in her paternal grandmother; Breast cancer (age of onset: 4) in her maternal aunt; Colon cancer in her paternal grandfather; Osteoarthritis in her sister; Prostate cancer in her father.   ROS:  Please see the history of present illness.   Otherwise, review of systems are positive for none.   All other systems are reviewed and negative.   PHYSICAL EXAM: VS:  There were no vitals taken for this visit. , BMI There is no weight on file to calculate BMI. GEN: Well nourished, well developed, in no acute distress HEENT: normal Neck: no JVD, carotid bruits, or masses Cardiac: RRR; 3/6 diastolic murmurs, rubs, or gallops,no edema  Respiratory:  clear to auscultation bilaterally, normal work of breathing GI: soft, nontender, nondistended, + BS MS: no deformity or atrophy Skin: warm and dry, no rash Neuro:  Strength and sensation are intact Psych: euthymic mood, full affect  EKG:  EKG is ordered today. The ekg ordered today demonstrates sinus bradycardia with ventricular rate 57 bpm, PACs otherwise normal EKG. When compared to the prior EKG from 2013 PVCs are no longer present.  Recent Labs: 07/16/2015: TSH 2.28 03/10/2016: ALT 13; BUN 18; Creatinine, Ser 0.77; Hemoglobin 13.2; Platelets 197.0; Potassium 4.6; Sodium 138   Lipid Panel      Component Value Date/Time   CHOL 222* 07/16/2015 0954   TRIG 67.0 07/16/2015 0954   HDL 93.00 07/16/2015 0954   CHOLHDL 2 07/16/2015 0954   VLDL 13.4 07/16/2015 0954   LDLCALC 116* 07/16/2015 0954   Wt Readings from Last 3 Encounters:  03/10/16 125 lb (56.7 kg)  03/10/16 127 lb (57.607 kg)  07/16/15 127 lb 8 oz (57.834 kg)    TTE: 02/03/2016 - Left ventricle: The cavity size was normal. Wall thickness was  normal. Systolic function was normal. The estimated ejection  fraction was in the range of 55% to 60%. Wall motion was normal;  there were no regional wall motion abnormalities. Indeterminant  diastolic function (tissue doppler not done). - Aortic valve: Poorly visualized. Possibly fused raphe between  right and left coronary cusps; moderately calcified leaflets.  There was  no stenosis. There was moderate regurgitation. - Mitral valve: There was trivial regurgitation. - Left atrium: The atrium was moderately to severely dilated. - Right ventricle: The cavity size was normal. Systolic function  was normal. - Right atrium: The atrium was moderately dilated. - Tricuspid valve: Peak RV-RA gradient (S): 27 mm Hg. - Pulmonary arteries: PA peak pressure: 35 mm Hg (S). - Systemic veins: IVC measured 2.3 cm with > 50% respirophasic  variation, suggesting RA pressure 8 mmHg.  Impressions:  - Normal LV size and systolic function, EF 0000000. Moderate to  severe left atrial enlargement. Normal RV size and systolic  function. Moderate aortic regurgitation. Aortic valve was  moderately calcified, possibly with fusion of raphe between left  and right coronary cusps (poorly visualized). Frequent PVCs.  Cardiac CTA 08/2010 Calcium Score: 61 with small nidus in D1, mid LAD and RCA  Coronary CTA: Right dominant with no anomalies. LM normal, LAD less than 30% calcific lesion in mid vessel. D1 less than 30% calcific lesion proximally. D2 normal. Circumflex normal  OM1 large branching normal. RCA dominant Mild calcification proximally with no stenosis.  Chambers: Mild RAE, Mild LVE. Qualitative EF normal. Normal LA and RV AV is trileaflet with minimal calcification. Aortic root is normal at 56mm  Noncardiac: See report from Dr Jobe Igo [facility]. S/P bilateral breast implants with ? abnormal soft tissue density in the [place] station and high mediatinum  Impression:  1) Calcium score 61 ( 75th percentile)  2) No significant obstructive CAD 3) Qualitatively normal EF 4) No aortic root dilatation with trileaflet AV 5) Suspicious soft tissue density in the LIMA station especially in light of previous breast CA. See radiology report.    ASSESSMENT AND PLAN:  1.  Aortic insufficiency - with functionally bicuspid aortic valve, aortic root and ascending aorta have normal size. Aortic insufficiency has been in the mild-to-moderate range, currently in moderate range on the lower end. With frequent ectopy pressure half-time anywhere from 340-800, however Vena contracta consistent with moderate aortic insufficiency. LVEDD remains normal and stable at 43 mm that is the same in 2015.  The patient also has mild MR, and moderately dilated left and right ventricle RVSP is 35 mmHg. The left atrial dilatation is most probably result of moderate chronic exercise and aortic root regurgitation. She is advised to continue same level of exercise. We will follow parameters in the future.  2. Coronary calcification with calcium score of 61 in 2011, patient has borderline cholesterol of 116 , no hypertension no family history of premature CAD, her calculated 10 year risk is 7.8%. There had a long discussion about primary prevention of coronary artery disease, at the end we concluded to start red yeast Rice 600 mg daily and repeat CMP and lipids in 2 months.  Follow up in 1 year.  Signed, Dorothy Spark, MD  03/14/2016  10:55 AM    [address], [place], [place]  [address] Phone: [phone]; Fax: [phone]

## 2016-05-10 ENCOUNTER — Ambulatory Visit: Payer: PPO | Admitting: Sports Medicine

## 2016-05-17 ENCOUNTER — Other Ambulatory Visit (INDEPENDENT_AMBULATORY_CARE_PROVIDER_SITE_OTHER): Payer: PPO

## 2016-05-17 DIAGNOSIS — I351 Nonrheumatic aortic (valve) insufficiency: Secondary | ICD-10-CM

## 2016-05-17 DIAGNOSIS — E785 Hyperlipidemia, unspecified: Secondary | ICD-10-CM | POA: Diagnosis not present

## 2016-05-17 LAB — LIPID PANEL
Cholesterol: 248 mg/dL — ABNORMAL HIGH (ref 125–200)
HDL: 111 mg/dL (ref >=46)
LDL Cholesterol: 126 mg/dL (ref <130)
Total CHOL/HDL Ratio: 2.2 Ratio (ref <=5.0)
Triglycerides: 57 mg/dL (ref <150)
VLDL: 11 mg/dL (ref <30)

## 2016-05-17 LAB — BASIC METABOLIC PANEL
BUN: 26 mg/dL — ABNORMAL HIGH (ref 7–25)
CO2: 25 mmol/L (ref 20–31)
Calcium: 9.5 mg/dL (ref 8.6–10.4)
Chloride: 104 mmol/L (ref 98–110)
Creat: 1.07 mg/dL — ABNORMAL HIGH (ref 0.60–0.93)
Glucose, Bld: 87 mg/dL (ref 65–99)
Potassium: 4.7 mmol/L (ref 3.5–5.3)
Sodium: 139 mmol/L (ref 135–146)

## 2016-05-17 LAB — HEPATIC FUNCTION PANEL
ALT: 14 U/L (ref 6–29)
AST: 17 U/L (ref 10–35)
Albumin: 4.2 g/dL (ref 3.6–5.1)
Alkaline Phosphatase: 51 U/L (ref 33–130)
Bilirubin, Direct: 0.1 mg/dL (ref <=0.2)
Indirect Bilirubin: 0.3 mg/dL (ref 0.2–1.2)
Total Bilirubin: 0.4 mg/dL (ref 0.2–1.2)
Total Protein: 6.3 g/dL (ref 6.1–8.1)

## 2016-05-19 ENCOUNTER — Telehealth: Payer: Self-pay | Admitting: *Deleted

## 2016-05-19 DIAGNOSIS — E785 Hyperlipidemia, unspecified: Secondary | ICD-10-CM

## 2016-05-19 DIAGNOSIS — I351 Nonrheumatic aortic (valve) insufficiency: Secondary | ICD-10-CM

## 2016-05-19 DIAGNOSIS — I498 Other specified cardiac arrhythmias: Secondary | ICD-10-CM

## 2016-05-19 MED ORDER — ROSUVASTATIN CALCIUM 5 MG PO TABS: 5.0000 mg | ORAL_TABLET | ORAL | Status: DC

## 2016-05-19 MED FILL — ROSUVASTATIN CALCIUM 5 MG T: 5 | 84 days supply | Qty: 36 | Fill #0

## 2016-05-19 NOTE — Telephone Encounter (Signed)
-----   Message from Dorothy Spark, MD sent at 05/19/2016  1:53 PM EDT ----- Rosuvastatin 5 mg po three times a week, repeat CMP and NMR lipids in 6-8 weeks. She would like prescriptions to be sent to the  [facility] outpatient pharmacy.

## 2016-05-19 NOTE — Telephone Encounter (Signed)
Left a detailed message for the pt to call back, to inform her that I called in her rosuvastatin 5 mg po three times a week, as discussed by Dr Meda Coffee over the phone earlier with the pt. Left a detailed message for the pt to call back to get her 6-8 week lab appt scheduled, to check a cmet and NMR W Lipids. Dr Meda Coffee confirmed which pharmacy the pt preferred her statin to be sent to, over the phone earlier.  Dr Meda Coffee endorsed her lab results to her as well.  Will continue to follow-up with the pt.

## 2016-05-25 NOTE — Telephone Encounter (Signed)
-----   Message from Dorothy Spark, MD sent at 05/19/2016  1:53 PM EDT ----- Rosuvastatin 5 mg po three times a week, repeat CMP and NMR lipids in 6-8 weeks. She would like prescriptions to be sent to the [facility] outpatient pharmacy.

## 2016-05-25 NOTE — Telephone Encounter (Signed)
Notified the pt that per Dr Meda Coffee, she recommends that she start taking rosuvastatin 5 mg po three times a week, and repeat a CMET and NMR W Lipids in 6-8 weeks.  Called the pts Rosuvastatin into [facility] last week.  Scheduled the pt a lab appt for 07/18/16 to check a cmet and nmr with lipids.  Pt verbalized understanding and agrees with this plan.

## 2016-06-10 MED FILL — BUPROPION HCL XL 150 MG TAB: 150 | 90 days supply | Qty: 90 | Fill #1

## 2016-07-06 ENCOUNTER — Other Ambulatory Visit: Payer: Self-pay | Admitting: Internal Medicine

## 2016-07-06 MED FILL — ESTRADIOL-NORETH 1.0-0.5 MG: 1-0.5 | 84 days supply | Qty: 84 | Fill #0

## 2016-07-06 NOTE — Telephone Encounter (Signed)
Please advise 

## 2016-07-18 ENCOUNTER — Other Ambulatory Visit: Payer: PPO | Admitting: *Deleted

## 2016-07-18 DIAGNOSIS — E785 Hyperlipidemia, unspecified: Secondary | ICD-10-CM | POA: Diagnosis not present

## 2016-07-18 DIAGNOSIS — I351 Nonrheumatic aortic (valve) insufficiency: Secondary | ICD-10-CM | POA: Diagnosis not present

## 2016-07-18 DIAGNOSIS — I498 Other specified cardiac arrhythmias: Secondary | ICD-10-CM

## 2016-07-18 DIAGNOSIS — I499 Cardiac arrhythmia, unspecified: Secondary | ICD-10-CM | POA: Diagnosis not present

## 2016-07-18 LAB — COMPREHENSIVE METABOLIC PANEL
ALT: 14 U/L (ref 6–29)
AST: 16 U/L (ref 10–35)
Albumin: 4 g/dL (ref 3.6–5.1)
Alkaline Phosphatase: 56 U/L (ref 33–130)
BUN: 22 mg/dL (ref 7–25)
CO2: 27 mmol/L (ref 20–31)
Calcium: 9.5 mg/dL (ref 8.6–10.4)
Chloride: 104 mmol/L (ref 98–110)
Creat: 0.95 mg/dL — ABNORMAL HIGH (ref 0.60–0.93)
Glucose, Bld: 93 mg/dL (ref 65–99)
Potassium: 4.6 mmol/L (ref 3.5–5.3)
Sodium: 140 mmol/L (ref 135–146)
Total Bilirubin: 0.5 mg/dL (ref 0.2–1.2)
Total Protein: 6.1 g/dL (ref 6.1–8.1)

## 2016-07-21 LAB — CARDIO IQ(R) ADVANCED LIPID PANEL
Apolipoprotein B: 62 mg/dL (ref 49–103)
Cholesterol, Total: 187 mg/dL (ref <200)
Cholesterol/HDL Ratio: 1.8 calc (ref <5.0)
HDL Cholesterol: 105 mg/dL (ref >50)
LDL Large: 8999 nmol/L (ref 5038–17886)
LDL Medium: 175 nmol/L (ref 121–397)
LDL Particle Number: 946 nmol/L — ABNORMAL LOW (ref 1016–2185)
LDL Peak Size: 219.9 Angstrom (ref >=218.2)
LDL Small: 149 nmol/L (ref 115–386)
LDL, Calculated: 70 mg/dL (ref <100)
Lipoprotein (a): 17 nmol/L (ref <75)
Non-HDL Cholesterol: 82 mg/dL (calc) (ref <130)
Triglycerides: 50 mg/dL (ref <150)

## 2016-07-27 ENCOUNTER — Telehealth: Payer: Self-pay | Admitting: *Deleted

## 2016-07-27 DIAGNOSIS — E785 Hyperlipidemia, unspecified: Secondary | ICD-10-CM

## 2016-07-27 NOTE — Telephone Encounter (Signed)
Order for the pt to have a NMR with Lipids and a CMET in one year placed, and will send [facility] scheduling to recall or call this pt to have this scheduled for one year out.  Pt made aware of this plan by Dr Meda Coffee.  Pt agreed to plan.

## 2016-07-27 NOTE — Telephone Encounter (Signed)
-----   Message from Dorothy Spark, MD sent at 07/27/2016  4:18 PM EDT ----- I talked to her, she agrees to continue taking crestor 5 mg po daily, please schedule repeat NMR with lipids and CMP in 1 year from now.  Thank you, K  ----- Message ----- From: Dorothy Spark, MD Sent: 07/22/2016   7:40 PM To: Dorothy Spark, MD  She has a great response to low dose crestor 5 mg/3x week, LDL decreased from 126 --> 70, TG from 57 to 50. LDL particles 946 (normal 1016 -2100).  Lipoprotein a 17 (normal < 75), apolipoprotein B 62 (normal 49-103) Great results, if crestor doesn't cause her any problems I would recommend to continue taking it.

## 2016-08-03 MED FILL — ROSUVASTATIN CALCIUM 5 MG T: 5 | 84 days supply | Qty: 36 | Fill #1

## 2016-08-05 MED FILL — AMOXICILLIN 500 MG CAPSULE: 500 | 7 days supply | Qty: 21 | Fill #0

## 2016-08-05 MED FILL — HYDROCODON-APAP 7.5-325: 7.5-325 | 3 days supply | Qty: 20 | Fill #0

## 2016-09-05 MED FILL — traMADol HCL 50 MG TABS: 50 | 10 days supply | Qty: 30 | Fill #1

## 2016-09-05 MED FILL — BUPROPION HCL XL 150 MG TAB: 150 | 90 days supply | Qty: 90 | Fill #2

## 2016-10-24 ENCOUNTER — Encounter: Payer: Self-pay | Admitting: Internal Medicine

## 2016-10-25 DIAGNOSIS — H52223 Regular astigmatism, bilateral: Secondary | ICD-10-CM | POA: Diagnosis not present

## 2016-10-25 DIAGNOSIS — H2513 Age-related nuclear cataract, bilateral: Secondary | ICD-10-CM | POA: Diagnosis not present

## 2016-10-25 DIAGNOSIS — H40013 Open angle with borderline findings, low risk, bilateral: Secondary | ICD-10-CM | POA: Diagnosis not present

## 2016-10-25 DIAGNOSIS — H40053 Ocular hypertension, bilateral: Secondary | ICD-10-CM | POA: Diagnosis not present

## 2016-10-25 DIAGNOSIS — H5213 Myopia, bilateral: Secondary | ICD-10-CM | POA: Diagnosis not present

## 2016-10-25 DIAGNOSIS — H524 Presbyopia: Secondary | ICD-10-CM | POA: Diagnosis not present

## 2016-11-09 MED FILL — ROSUVASTATIN CALCIUM 5 MG T: 5 | 84 days supply | Qty: 36 | Fill #2

## 2016-11-17 NOTE — Progress Notes (Signed)
Subjective:    Patient ID: Valerie Dragon, MD, female    DOB: Aug 17, 1946, 70 y.o.   MRN: [id number]  HPI error  Medications and allergies reviewed with patient and updated if appropriate.  Patient Active Problem List   Diagnosis Date Noted  . Hyperlipidemia 05/19/2016  . Adjustment disorder with depressed mood 03/10/2016  . Pain in joint, lower leg 03/10/2016  . Onychomycosis 03/10/2016  . Tendinopathy of left rotator cuff 03/10/2016  . Aortic regurgitation 02/02/2016  . Osteopenia   . Achilles tendinitis of both lower extremities 12/05/2013  . Medial epicondylitis of left elbow 10/08/2013  . Personal history of colonic polyps-1 cm hyperplastic splenic flexure 11/29/2012  . Breast cancer ([place])   . Right ventricle   . CAD (coronary artery disease)   . Atrial bigeminy   . Aortic insufficiency   . Diastolic dysfunction   . Abnormal CT scan, chest   . Ejection fraction   . HIP PAIN, RIGHT 07/02/2010  . ADENOCARCINOMA, BREAST, ER POSITIVE 08/28/2009    Current Outpatient Prescriptions on File Prior to Visit  Medication Sig Dispense Refill  . ALPRAZolam (XANAX) 0.25 MG tablet Take 1 tablet (0.25 mg total) by mouth at bedtime as needed for anxiety. 30 tablet 1  . buPROPion (WELLBUTRIN XL) 150 MG 24 hr tablet Take 1 tablet (150 mg total) by mouth daily. 90 tablet 3  . diclofenac (VOLTAREN) 75 MG EC tablet Take 1 tablet (75 mg total) by mouth 2 (two) times daily as needed for mild pain. 180 tablet 3  . estradiol-norethindrone (ACTIVELLA) 1-0.5 MG tablet TAKE 1 TABLET BY MOUTH DAILY. KEEP APPT W/NEW PCP FOR FUTURE REFILLS 84 tablet 1  . Red Yeast Rice 600 MG TABS Take 1 tablet (600 mg total) by mouth daily. 30 tablet 11  . rosuvastatin (CRESTOR) 5 MG tablet Take 1 tablet (5 mg total) by mouth 3 (three) times a week. 54 tablet 3  . terbinafine (LAMISIL) 250 MG tablet Take 1 tablet (250 mg total) by mouth daily. 90 tablet 0   No current facility-administered medications on file  prior to visit.     Past Medical History:  Diagnosis Date  . Abnormal CT scan, chest    Question of abnormal lymph nodes in the chest, biopsy was -2011, felt reactive adenopathy due to silicone implants (subsquently replaced)  . 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 Atrial bigeminy    Present at slow heart rate  . 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 CAD (coronary artery disease)    Cardiac CT,09/2010, calcium score 61, , small nidus LAD and RCA, and a below the knee  . Diastolic dysfunction    Stress echo, normal, November, 2012  . Ejection fraction    EF 60%, echo, November, 2012  . Osteopenia   . Personal history of colonic polyps-1 cm hyperplastic splenic flexure 11/29/2012  . Right ventricle    Question of mild right ventricular enlargement  in 2011 /   right ventricular size seems normal  in November, 2012, there is no documented pulmonary hypertension.    Past Surgical History:  Procedure Laterality Date  . BREAST RECONSTRUCTION  [id number]   implants exchanged  . BREAST SURGERY  1995   rt and lt total mastectomies-reconstruction  . COLONOSCOPY    . FACIAL COSMETIC SURGERY    . LYMPH NODE BIOPSY    . MOUTH SURGERY    . [place]  rt  . Varicose vein injections      Social History   Social History  . Marital status: Married    Spouse name: Darnell Level  . Number of children: 3  . Years of education: 25   Occupational History  . 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 Smoking status: Never Smoker  . Smokeless tobacco: Never Used  . Alcohol use 2.5 oz/week    5 drink(s) per week     Comment: occ  . Drug use: No  . Sexual activity: Yes    Partners: Male    Birth control/ protection: Post-menopausal   Other Topics Concern  . Not on file   Social History Narrative   [place], [place]. Married - '71- 7 years/divorced; Married '86. 1 son -  '85; 2 step-children. 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 ACP/end of life - full resiscitation, reasonable duration mechanical ventilatory support, no heroic/futile measures. HCPOA - spouse    Family History  Problem Relation Age of Onset  . Prostate cancer Father   . Breast cancer Maternal Aunt 33  . Breast cancer Paternal Grandmother 55  . Colon cancer Paternal Grandfather     rectal  . Osteoarthritis Sister   . Aneurysm Brother 6    brain, no intervention    Review of Systems     Objective:  There were no vitals filed for this visit. There were no vitals filed for this visit. There is no height or weight on file to calculate BMI.  Wt Readings from Last 3 Encounters:  03/14/16 125 lb 12.8 oz (57.1 kg)  03/10/16 125 lb (56.7 kg)  03/10/16 127 lb (57.6 kg)     Physical Exam         Assessment & Plan:      This encounter was created in error - please disregard.

## 2016-11-18 ENCOUNTER — Encounter: Payer: PPO | Admitting: Internal Medicine

## 2016-11-24 NOTE — Progress Notes (Signed)
Subjective:    Patient ID: [name], MD, female    DOB: 1945/11/26, 71 y.o.   MRN: [id number]  HPI She is here for follow up.  Hyperlipidemia: She is taking her medication daily. She is compliant with a low fat/cholesterol diet. She is exercising regularly. She denies myalgias.   Depression, adjustment disorder: She is taking her medication daily as prescribed. She denies any side effects from the medication. She feels her depression is well controlled and she is happy with her current dose of medication.   Mild CAD, AI:  She sees Dr Meda Coffee.    Joint pain:  She takes diclofenac and tramadol only as needed.    Medications and allergies reviewed with patient and updated if appropriate.  Patient Active Problem List   Diagnosis Date Noted  . Hyperlipidemia 05/19/2016  . Adjustment disorder with depressed mood 03/10/2016  . Pain in joint, lower leg 03/10/2016  . Onychomycosis 03/10/2016  . Tendinopathy of left rotator cuff 03/10/2016  . Aortic regurgitation 02/02/2016  . Osteopenia   . Achilles tendinitis of both lower extremities 12/05/2013  . Medial epicondylitis of left elbow 10/08/2013  . Personal history of colonic polyps-1 cm hyperplastic splenic flexure 11/29/2012  . Breast cancer ([place])   . Right ventricle   . CAD (coronary artery disease)   . Atrial bigeminy   . Aortic insufficiency   . Diastolic dysfunction   . Abnormal CT scan, chest   . Ejection fraction   . HIP PAIN, RIGHT 07/02/2010  . ADENOCARCINOMA, BREAST, ER POSITIVE 08/28/2009    Current Outpatient Prescriptions on File Prior to Visit  Medication Sig Dispense Refill  . ALPRAZolam (XANAX) 0.25 MG tablet Take 1 tablet (0.25 mg total) by mouth at bedtime as needed for anxiety. 30 tablet 1  . buPROPion (WELLBUTRIN XL) 150 MG 24 hr tablet Take 1 tablet (150 mg total) by mouth daily. 90 tablet 3  . diclofenac (VOLTAREN) 75 MG EC tablet Take 1 tablet (75 mg total) by mouth 2 (two) times daily as needed for  mild pain. 180 tablet 3  . estradiol-norethindrone (ACTIVELLA) 1-0.5 MG tablet TAKE 1 TABLET BY MOUTH DAILY. KEEP APPT W/NEW PCP FOR FUTURE REFILLS 84 tablet 1  . rosuvastatin (CRESTOR) 5 MG tablet Take 1 tablet (5 mg total) by mouth 3 (three) times a week. 54 tablet 3  . terbinafine (LAMISIL) 250 MG tablet Take 1 tablet (250 mg total) by mouth daily. 90 tablet 0   No current facility-administered medications on file prior to visit.     Past Medical History:  Diagnosis Date  . Abnormal CT scan, chest    Question of abnormal lymph nodes in the chest, biopsy was -2011, felt reactive adenopathy due to silicone implants (subsquently replaced)  . 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 Atrial bigeminy    Present at slow heart rate  . 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 CAD (coronary artery disease)    Cardiac CT,09/2010, calcium score 61, , small nidus LAD and RCA, and a below the knee  . Diastolic dysfunction    Stress echo, normal, November, 2012  . Ejection fraction    EF 60%, echo, November, 2012  . Osteopenia   . Personal history of colonic polyps-1 cm hyperplastic splenic flexure 11/29/2012  . Right ventricle    Question of mild right ventricular enlargement  in 2011 /   right ventricular size seems normal  in November, 2012, there is no documented pulmonary hypertension.    Past Surgical History:  Procedure Laterality Date  . BREAST RECONSTRUCTION  [id number]   implants exchanged  . BREAST SURGERY  1995   rt and lt total mastectomies-reconstruction  . COLONOSCOPY    . FACIAL COSMETIC SURGERY    . LYMPH NODE BIOPSY    . MOUTH SURGERY    . OOPHORECTOMY  1982   rt  . Varicose vein injections      Social History   Social History  . Marital status: Married    Spouse name: Darnell Level  . Number of children: 3  . Years of education: 25   Occupational History  . physician/GI     [employer]   Social History Main Topics  .  Smoking status: Never Smoker  . Smokeless tobacco: Never Used  . Alcohol use 2.5 oz/week    5 drink(s) per week     Comment: occ  . Drug use: No  . Sexual activity: Yes    Partners: Male    Birth control/ protection: Post-menopausal   Other Topics Concern  . None   Social History Narrative   [employer], [place]. Married - '71- 7 years/divorced; Married '86. 1 son - '85; 2 step-children. 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 ACP/end of life - full resiscitation, reasonable duration mechanical ventilatory support, no heroic/futile measures. HCPOA - spouse    Family History  Problem Relation Age of Onset  . Prostate cancer Father   . Breast cancer Maternal Aunt 28  . Breast cancer Paternal Grandmother 60  . Colon cancer Paternal Grandfather     rectal  . Osteoarthritis Sister   . Aneurysm Brother 47    brain, no intervention    Review of Systems  Constitutional: Negative for fever.  Respiratory: Negative for cough, shortness of breath and wheezing.   Cardiovascular: Positive for palpitations. Negative for chest pain and leg swelling.  Musculoskeletal: Positive for arthralgias.  Neurological: Negative for light-headedness and headaches.  Psychiatric/Behavioral: Positive for dysphoric mood (Controlled).       Objective:   Vitals:   11/25/16 1050  BP: 124/84  Pulse: 71  Resp: 16  Temp: 97.6 F (36.4 C)   Filed Weights   11/25/16 1050  Weight: 126 lb (57.2 kg)   Body mass index is 20.97 kg/m.  Wt Readings from Last 3 Encounters:  11/25/16 126 lb (57.2 kg)  03/14/16 125 lb 12.8 oz (57.1 kg)  03/10/16 125 lb (56.7 kg)     Physical Exam Constitutional: Appears well-developed and well-nourished. No distress.  HENT:  Head: Normocephalic and atraumatic.  Neck: Neck supple. No tracheal deviation present. No thyromegaly present.  No cervical lymphadenopathy Cardiovascular: Normal rate, regular rhythm and normal  heart sounds.   No murmur heard. No carotid bruit .  No edema Pulmonary/Chest: Effort normal and breath sounds normal. No respiratory distress. No has no wheezes. No rales.  Skin: Skin is warm and dry. Not diaphoretic.  Psychiatric: Normal mood and affect. Behavior is normal.         Assessment & Plan:   See Problem List for Assessment and Plan of chronic medical problems.

## 2016-11-25 ENCOUNTER — Other Ambulatory Visit (INDEPENDENT_AMBULATORY_CARE_PROVIDER_SITE_OTHER): Payer: PPO

## 2016-11-25 ENCOUNTER — Encounter: Payer: Self-pay | Admitting: Internal Medicine

## 2016-11-25 ENCOUNTER — Ambulatory Visit (INDEPENDENT_AMBULATORY_CARE_PROVIDER_SITE_OTHER): Payer: PPO | Admitting: Internal Medicine

## 2016-11-25 VITALS — BP 124/84 | HR 71 | Temp 97.6°F | Resp 16 | Wt 126.0 lb

## 2016-11-25 DIAGNOSIS — E559 Vitamin D deficiency, unspecified: Secondary | ICD-10-CM

## 2016-11-25 DIAGNOSIS — M8580 Other specified disorders of bone density and structure, unspecified site: Secondary | ICD-10-CM | POA: Diagnosis not present

## 2016-11-25 DIAGNOSIS — M2550 Pain in unspecified joint: Secondary | ICD-10-CM

## 2016-11-25 DIAGNOSIS — B351 Tinea unguium: Secondary | ICD-10-CM

## 2016-11-25 DIAGNOSIS — E7800 Pure hypercholesterolemia, unspecified: Secondary | ICD-10-CM

## 2016-11-25 DIAGNOSIS — F4321 Adjustment disorder with depressed mood: Secondary | ICD-10-CM

## 2016-11-25 LAB — COMPREHENSIVE METABOLIC PANEL
ALBUMIN: 4.5 g/dL (ref 3.5–5.2)
ALT: 15 U/L (ref 0–35)
AST: 16 U/L (ref 0–37)
Alkaline Phosphatase: 66 U/L (ref 39–117)
BUN: 20 mg/dL (ref 6–23)
CALCIUM: 9.9 mg/dL (ref 8.4–10.5)
CHLORIDE: 104 meq/L (ref 96–112)
CO2: 31 mEq/L (ref 19–32)
Creatinine, Ser: 0.92 mg/dL (ref 0.40–1.20)
GFR: 63.98 mL/min (ref >60.00)
Glucose, Bld: 79 mg/dL (ref 70–99)
Potassium: 5.1 mEq/L (ref 3.5–5.1)
Sodium: 139 mEq/L (ref 135–145)
Total Bilirubin: 0.6 mg/dL (ref 0.2–1.2)
Total Protein: 7 g/dL (ref 6.0–8.3)

## 2016-11-25 MED ORDER — BUPROPION HCL ER (XL) 150 MG PO TB24: 150.0000 mg | ORAL_TABLET | Freq: Every day | ORAL | 3 refills | Status: DC

## 2016-11-25 MED ORDER — TERBINAFINE HCL 250 MG PO TABS: 250.0000 mg | ORAL_TABLET | Freq: Every day | ORAL | 0 refills | Status: DC

## 2016-11-25 MED ORDER — TRAMADOL HCL 50 MG PO TABS: 50.0000 mg | ORAL_TABLET | Freq: Four times a day (QID) | ORAL | 0 refills | Status: DC | PRN

## 2016-11-25 MED ORDER — ALPRAZOLAM 0.25 MG PO TABS: 0.2500 mg | ORAL_TABLET | Freq: Every evening | ORAL | 1 refills | Status: DC | PRN

## 2016-11-25 MED ORDER — DICLOFENAC SODIUM 75 MG PO TBEC: 75.0000 mg | DELAYED_RELEASE_TABLET | Freq: Two times a day (BID) | ORAL | 3 refills | Status: DC | PRN

## 2016-11-25 MED FILL — TERBINAFINE HCL 250 MG TAB: 250 | 90 days supply | Qty: 90 | Fill #0

## 2016-11-25 MED FILL — traMADol HCL 50 MG TABS: 50 | 7 days supply | Qty: 30 | Fill #0

## 2016-11-25 MED FILL — BUPROPION HCL XL 150 MG TAB: 150 | 90 days supply | Qty: 90 | Fill #0

## 2016-11-25 MED FILL — DICLOFENAC SOD 75 MG TAB EC: 75 | 90 days supply | Qty: 180 | Fill #0

## 2016-11-25 MED FILL — ALPRAZolam 0.25 MG TABS: 0.25 | 30 days supply | Qty: 30 | Fill #0

## 2016-11-25 NOTE — Patient Instructions (Signed)
  Test(s) ordered today. Your results will be released to MyChart (or called to you) after review, usually within 72hours after test completion. If any changes need to be made, you will be notified at that same time.   Medications reviewed and updated.  No changes recommended at this time.  Your prescription(s) have been submitted to your pharmacy. Please take as directed and contact our office if you believe you are having problem(s) with the medication(s).   Please followup in one year   

## 2016-11-25 NOTE — Progress Notes (Signed)
Pre visit review using our clinic review tool, if applicable. No additional management support is needed unless otherwise documented below in the visit note. 

## 2016-11-26 DIAGNOSIS — M2550 Pain in unspecified joint: Secondary | ICD-10-CM | POA: Insufficient documentation

## 2016-11-26 NOTE — Assessment & Plan Note (Signed)
Continue oral Lamisil Check CMP

## 2016-11-26 NOTE — Assessment & Plan Note (Signed)
Taking vitamin D daily We'll check level to make sure she is taking enough

## 2016-11-26 NOTE — Assessment & Plan Note (Signed)
Has a few chronic joint pains Takes diclofenac I-2 times daily as needed Takes tramadol on occasion for more severe pain Will continue

## 2016-11-26 NOTE — Assessment & Plan Note (Signed)
Controlled, stable Continue current dose of medication  

## 2016-11-26 NOTE — Assessment & Plan Note (Signed)
Taking Crestor 3 times weekly Continue

## 2016-11-28 LAB — VITAMIN D 25 HYDROXY (VIT D DEFICIENCY, FRACTURES): VITD: 33.68 ng/mL (ref 30.00–100.00)

## 2016-11-30 ENCOUNTER — Encounter: Payer: Self-pay | Admitting: Internal Medicine

## 2016-12-19 MED FILL — ESTRADIOL-NORETH 1.0-0.5 MG: 1-0.5 | 84 days supply | Qty: 84 | Fill #1

## 2016-12-26 DIAGNOSIS — D1801 Hemangioma of skin and subcutaneous tissue: Secondary | ICD-10-CM | POA: Diagnosis not present

## 2016-12-26 DIAGNOSIS — D225 Melanocytic nevi of trunk: Secondary | ICD-10-CM | POA: Diagnosis not present

## 2016-12-26 DIAGNOSIS — L820 Inflamed seborrheic keratosis: Secondary | ICD-10-CM | POA: Diagnosis not present

## 2016-12-26 DIAGNOSIS — D2272 Melanocytic nevi of left lower limb, including hip: Secondary | ICD-10-CM | POA: Diagnosis not present

## 2016-12-26 DIAGNOSIS — L814 Other melanin hyperpigmentation: Secondary | ICD-10-CM | POA: Diagnosis not present

## 2016-12-26 DIAGNOSIS — L821 Other seborrheic keratosis: Secondary | ICD-10-CM | POA: Diagnosis not present

## 2016-12-26 DIAGNOSIS — Z85828 Personal history of other malignant neoplasm of skin: Secondary | ICD-10-CM | POA: Diagnosis not present

## 2016-12-26 DIAGNOSIS — D485 Neoplasm of uncertain behavior of skin: Secondary | ICD-10-CM | POA: Diagnosis not present

## 2017-01-30 MED FILL — ROSUVASTATIN CALCIUM 5 MG T: 5 | 84 days supply | Qty: 36 | Fill #3

## 2017-03-06 MED FILL — BUPROPION HCL XL 150 MG TAB: 150 | 90 days supply | Qty: 90 | Fill #1

## 2017-03-20 ENCOUNTER — Ambulatory Visit (INDEPENDENT_AMBULATORY_CARE_PROVIDER_SITE_OTHER): Payer: PPO | Admitting: Cardiology

## 2017-03-20 VITALS — BP 122/62 | HR 58 | Ht 65.0 in | Wt 125.0 lb

## 2017-03-20 DIAGNOSIS — I498 Other specified cardiac arrhythmias: Secondary | ICD-10-CM | POA: Diagnosis not present

## 2017-03-20 DIAGNOSIS — I351 Nonrheumatic aortic (valve) insufficiency: Secondary | ICD-10-CM

## 2017-03-20 DIAGNOSIS — E784 Other hyperlipidemia: Secondary | ICD-10-CM | POA: Diagnosis not present

## 2017-03-20 DIAGNOSIS — E7849 Other hyperlipidemia: Secondary | ICD-10-CM

## 2017-03-20 NOTE — Patient Instructions (Signed)
Medication Instructions:   Your physician recommends that you continue on your current medications as directed. Please refer to the Current Medication list given to you today.    Labwork:  TODAY---CBC W DIFF, TSH, NMR LIPOPROFILE, APOLIPOPROTEIN B, LIPOPROTEIN A, AND VITAMIN D      Testing/Procedures:  Your physician has requested that you have an echocardiogram. Echocardiography is a painless test that uses sound waves to create images of your heart. It provides your doctor with information about the size and shape of your heart and how well your heart's chambers and valves are working. This procedure takes approximately one hour. There are no restrictions for this procedure.  PLEASE HAVE THIS SCHEDULED PRIOR TOO YOUR ONE YEAR FOLLOW-UP APPOINTMENT WITH DR Meda Coffee     Follow-Up:  Your physician wants you to follow-up in: [place] will receive a reminder letter in the mail two months in advance. If you don't receive a letter, please call our office to schedule the follow-up appointment.  PLEASE SCHEDULE YOUR ECHO A WEEK OR SO PRIOR TO THIS VISIT       If you need a refill on your cardiac medications before your next appointment, please call your pharmacy.

## 2017-03-20 NOTE — Progress Notes (Signed)
Patient ID: Valerie Dragon, MD, female   DOB: 05/30/1946, 71 y.o.   MRN: [id number]      Cardiology Office Note  Date:  03/20/2017   ID:  Valerie Dragon, MD, DOB 06-29-1946, MRN [id number]  PCP:  [name], MD  Cardiologist:   [name], MD, previously followed by Dr. 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 female who presents for follow-up for aortic insufficiency. Dr. 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 She also has a history of symptomatic PACs and PVCs that were not treated in the past as she is bradycardic at baseline. At some of the older echo reports there was a statement about the right ventricular dilatation however on the most recent is normal.  There was question in 2011 of some increase in right ventricular size. Ultimately it was decided that there was no significant abnormality. There has been no evidence of pulmonary hypertension. There was no PFO or ASD by bubble study. Right ventricular size and function appear normal, echo, January, 2015.  Dr. Olevia Perches underwent cardiac CTA in 2011 that showed: 1) Calcium score 61 ( 75th percentile)  2) No significant obstructive CAD 3) Qualitatively normal EF 4) No aortic root dilatation with trileaflet AV 5) Suspicious soft tissue density in the LIMA station especially  Her most recent lipid panel in August 2016 showed LDL 116 HDL 93 and triglyceride of 67.  03/20/17 - 1 year follow up, no change in symptoms, she continues to run 3 miles a day and approximately 20 miles a week on average. She now also bikes as they are preparing for a biking vacation. She denies any shortness of breath, chest pain, no orthopnea paroxysmal nocturnal dyspnea or lower extremity edema, she feels  her ectopy, especially at night but it's not associated with shortness of breath or dizziness. No recent syncope. The patient has no side effects from Crestor.  Past Medical History:  Diagnosis Date  . Abnormal CT scan, chest    Question of abnormal lymph nodes in the chest, biopsy was -2011, felt reactive adenopathy due to silicone implants (subsquently replaced)  . 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 Atrial bigeminy    Present at slow heart rate  . 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 CAD (coronary artery disease)    Cardiac CT,09/2010, calcium score 61, , small nidus LAD and RCA, and a below the knee  . Diastolic dysfunction    Stress echo, normal, November, 2012  . Ejection fraction    EF 60%, echo, November, 2012  . Osteopenia   . Personal history of colonic polyps-1 cm hyperplastic splenic flexure 11/29/2012  . Right ventricle    Question of mild right ventricular enlargement  in 2011 /   right ventricular size seems normal  in November, 2012, there is no documented pulmonary hypertension.   Past Surgical History:  Procedure Laterality Date  . BREAST RECONSTRUCTION  [id number]   implants exchanged  . BREAST SURGERY  1995   rt and lt total mastectomies-reconstruction  . COLONOSCOPY    . FACIAL COSMETIC SURGERY    . LYMPH NODE BIOPSY    . MOUTH SURGERY    . OOPHORECTOMY  1982   rt  .  Varicose vein injections     Current Outpatient Prescriptions  Medication Sig Dispense Refill  . ALPRAZolam (XANAX) 0.25 MG tablet Take 1 tablet (0.25 mg total) by mouth at bedtime as needed for anxiety. 30 tablet 1  . buPROPion (WELLBUTRIN XL) 150 MG 24 hr tablet Take 1 tablet (150 mg total) by mouth daily. 90 tablet 3  . diclofenac (VOLTAREN) 75 MG EC tablet Take 1 tablet (75 mg total) by mouth 2 (two) times daily as needed for mild pain. 180 tablet 3  . estradiol-norethindrone (ACTIVELLA) 1-0.5 MG tablet TAKE 1 TABLET BY MOUTH DAILY. KEEP APPT W/NEW  PCP FOR FUTURE REFILLS 84 tablet 1  . rosuvastatin (CRESTOR) 5 MG tablet Take 1 tablet (5 mg total) by mouth 3 (three) times a week. 54 tablet 3  . terbinafine (LAMISIL) 250 MG tablet Take 1 tablet (250 mg total) by mouth daily. 90 tablet 0  . traMADol (ULTRAM) 50 MG tablet Take 1 tablet (50 mg total) by mouth every 6 (six) hours as needed. 30 tablet 0   No current facility-administered medications for this visit.    Allergies:   Patient has no known allergies.   Social History:  The patient  reports that she has never smoked. She has never used smokeless tobacco. She reports that she drinks about 2.5 oz of alcohol per week . She reports that she does not use drugs.   Family History:  The patient's family history includes Aneurysm (age of onset: 53) in her brother; Breast cancer (age of onset: 62) in her paternal grandmother; Breast cancer (age of onset: 46) in her maternal aunt; Colon cancer in her paternal grandfather; Osteoarthritis in her sister; Prostate cancer in her father.   ROS:  Please see the history of present illness.   Otherwise, review of systems are positive for none.   All other systems are reviewed and negative.   PHYSICAL EXAM: VS:  BP 122/62   Pulse (!) 58   Ht 5\' 5"  (1.651 m)   Wt 125 lb (56.7 kg)   BMI 20.80 kg/m  , BMI Body mass index is 20.8 kg/m. GEN: Well nourished, well developed, in no acute distress  HEENT: normal  Neck: no JVD, carotid bruits, or masses Cardiac: RRR; 3/6 diastolic murmurs, rubs, or gallops,no edema  Respiratory:  clear to auscultation bilaterally, normal work of breathing GI: soft, nontender, nondistended, + BS MS: no deformity or atrophy  Skin: warm and dry, no rash Neuro:  Strength and sensation are intact Psych: euthymic mood, full affect  EKG:  EKG is ordered today. The ekg ordered today demonstrates sinus bradycardia with ventricular rate 57 bpm, PACs otherwise normal EKG. When compared to the prior EKG from 2013 PVCs are no  longer present.  Recent Labs: 11/25/2016: ALT 15; BUN 20; Creatinine, Ser 0.92; Potassium 5.1; Sodium 139   Lipid Panel    Component Value Date/Time   CHOL 187 07/18/2016 0734   TRIG 50 07/18/2016 0734   HDL 105 07/18/2016 0734   CHOLHDL 1.8 07/18/2016 0734   CHOLHDL 2.2 05/17/2016 0847   VLDL 11 05/17/2016 0847   LDLCALC 70 07/18/2016 0734   Wt Readings from Last 3 Encounters:  03/20/17 125 lb (56.7 kg)  11/25/16 126 lb (57.2 kg)  03/14/16 125 lb 12.8 oz (57.1 kg)    TTE: 02/03/2016 - Left ventricle: The cavity size was normal. Wall thickness was  normal. Systolic function was normal. The estimated ejection  fraction was in the range of 55% to  60%. Wall motion was normal;  there were no regional wall motion abnormalities. Indeterminant  diastolic function (tissue doppler not done). - Aortic valve: Poorly visualized. Possibly fused raphe between  right and left coronary cusps; moderately calcified leaflets.  There was no stenosis. There was moderate regurgitation. - Mitral valve: There was trivial regurgitation. - Left atrium: The atrium was moderately to severely dilated. - Right ventricle: The cavity size was normal. Systolic function  was normal. - Right atrium: The atrium was moderately dilated. - Tricuspid valve: Peak RV-RA gradient (S): 27 mm Hg. - Pulmonary arteries: PA peak pressure: 35 mm Hg (S). - Systemic veins: IVC measured 2.3 cm with > 50% respirophasic  variation, suggesting RA pressure 8 mmHg.  Impressions:  - Normal LV size and systolic function, EF 13-08%. Moderate to  severe left atrial enlargement. Normal RV size and systolic  function. Moderate aortic regurgitation. Aortic valve was  moderately calcified, possibly with fusion of raphe between left  and right coronary cusps (poorly visualized). Frequent PVCs.  Cardiac CTA 08/2010 Calcium Score: 61 with small nidus in D1, mid LAD and RCA  Coronary CTA: Right dominant with no  anomalies. LM normal, LAD less than 30% calcific lesion in mid vessel. D1 less than 30% calcific lesion proximally. D2 normal. Circumflex normal OM1 large branching normal. RCA dominant Mild calcification proximally with no stenosis.  Chambers: Mild RAE, Mild LVE. Qualitative EF normal. Normal LA and RV AV is trileaflet with minimal calcification. Aortic root is normal at 58mm  Noncardiac: See report from Dr Jobe Igo [facility]. S/P bilateral breast implants with ? abnormal soft tissue density in the [place] station and high mediatinum  Impression:  1) Calcium score 61 ( 75th percentile)  2) No significant obstructive CAD 3) Qualitatively normal EF 4) No aortic root dilatation with trileaflet AV 5) Suspicious soft tissue density in the LIMA station especially in light of previous breast CA. See radiology report.    ASSESSMENT AND PLAN:  1.  Aortic insufficiency - with functionally bicuspid aortic valve, aortic root and ascending aorta have normal size. Aortic insufficiency has been in the mild-to-moderate range, currently in moderate range on the lower end. With frequent ectopy pressure half-time anywhere from 340-800, however Vena contracta consistent with moderate aortic insufficiency. LVEDD remains normal and stable at 43 mm that is the same in 2015.  The patient also has mild MR, RVSP is 35 mmHg. The left atrial dilatation is most probably result of moderate lifelong exercise and aortic regurgitation. She is advised to continue same level of exercise. We will follow parameters in the future.  2. Coronary calcification with calcium score of 61 in 2011, patient had borderline cholesterol of 116 , no hypertension no family history of premature CAD, her calculated 10 year risk is 7.8%. She was started on Crestor 5 mg po 3x/week, most recent NMR lipid profile showed HDL 111, TG 57, LDL particle number 946. No side effects, we will  continue. Recheck NMR today.  3. Very frequent PACs, in a pattern of bigeminy, she is aware of them, but not associated with CP, SOB or dizziness.  Follow up in 1 year. Check echo in 1 year prior to the next appointment. Check NMR profile, CBC, TSH Vit D, recent CMP was normal.  Signed, [name], MD  03/20/2017 10:00 AM    [address], [place], [place]  [address] Phone: [phone]; Fax: [phone]

## 2017-03-21 LAB — CBC WITH DIFFERENTIAL/PLATELET
Basophils Absolute: 0 10*3/uL (ref 0.0–0.2)
Basos: 1 %
EOS (ABSOLUTE): 0.2 10*3/uL (ref 0.0–0.4)
Eos: 4 %
Hematocrit: 41.6 % (ref 34.0–46.6)
Hemoglobin: 13.9 g/dL (ref 11.1–15.9)
Immature Grans (Abs): 0 10*3/uL (ref 0.0–0.1)
Immature Granulocytes: 0 %
Lymphocytes Absolute: 1.7 10*3/uL (ref 0.7–3.1)
Lymphs: 36 %
MCH: 30 pg (ref 26.6–33.0)
MCHC: 33.4 g/dL (ref 31.5–35.7)
MCV: 90 fL (ref 79–97)
Monocytes Absolute: 0.4 10*3/uL (ref 0.1–0.9)
Monocytes: 8 %
Neutrophils Absolute: 2.5 10*3/uL (ref 1.4–7.0)
Neutrophils: 51 %
Platelets: 279 10*3/uL (ref 150–379)
RBC: 4.63 x10E6/uL (ref 3.77–5.28)
RDW: 13.2 % (ref 12.3–15.4)
WBC: 4.8 10*3/uL (ref 3.4–10.8)

## 2017-03-21 LAB — NMR, LIPOPROFILE
Cholesterol: 224 mg/dL — ABNORMAL HIGH (ref 100–199)
HDL Cholesterol by NMR: 103 mg/dL (ref >39)
HDL Particle Number: 37.7 umol/L (ref >=30.5)
LDL Particle Number: 1130 nmol/L — ABNORMAL HIGH (ref <1000)
LDL Size: 21.3 nm (ref >20.5)
LDL-C: 109 mg/dL — ABNORMAL HIGH (ref 0–99)
LP-IR Score: <25 (ref <=45)
Small LDL Particle Number: <90 nmol/L (ref <=527)
Triglycerides by NMR: 61 mg/dL (ref 0–149)

## 2017-03-21 LAB — VITAMIN D 25 HYDROXY (VIT D DEFICIENCY, FRACTURES): Vit D, 25-Hydroxy: 35.2 ng/mL (ref 30.0–100.0)

## 2017-03-21 LAB — APOLIPOPROTEIN B: Apolipoprotein B: 88 mg/dL (ref 54–133)

## 2017-03-21 LAB — TSH: TSH: 2.73 u[IU]/mL (ref 0.450–4.500)

## 2017-03-21 LAB — LIPOPROTEIN A (LPA): Lipoprotein (a): 13 nmol/L (ref <75)

## 2017-04-21 MED FILL — ROSUVASTATIN CALCIUM 5 MG T: 5 | 84 days supply | Qty: 36 | Fill #4

## 2017-06-05 ENCOUNTER — Other Ambulatory Visit: Payer: Self-pay | Admitting: Internal Medicine

## 2017-06-05 MED FILL — ESTRADIOL-NORETH 1.0-0.5 MG: 1-0.5 | 84 days supply | Qty: 84 | Fill #0

## 2017-06-06 DIAGNOSIS — H25813 Combined forms of age-related cataract, bilateral: Secondary | ICD-10-CM | POA: Diagnosis not present

## 2017-06-06 DIAGNOSIS — H40053 Ocular hypertension, bilateral: Secondary | ICD-10-CM | POA: Diagnosis not present

## 2017-06-29 ENCOUNTER — Ambulatory Visit (INDEPENDENT_AMBULATORY_CARE_PROVIDER_SITE_OTHER)
Admission: RE | Admit: 2017-06-29 | Discharge: 2017-06-29 | Disposition: A | Discharge status: Home / Self Care | Payer: PPO | Source: Ambulatory Visit | Attending: Internal Medicine | Admitting: Internal Medicine

## 2017-06-29 ENCOUNTER — Telehealth: Payer: Self-pay | Admitting: Internal Medicine

## 2017-06-29 DIAGNOSIS — M542 Cervicalgia: Secondary | ICD-10-CM

## 2017-06-29 NOTE — Telephone Encounter (Signed)
Ok ordered

## 2017-06-29 NOTE — Telephone Encounter (Signed)
Spoke with pt to inform.  

## 2017-06-29 NOTE — Telephone Encounter (Signed)
Pt called back regarding this. Please call back

## 2017-06-29 NOTE — Telephone Encounter (Signed)
Pt called in and wanted to know if Dr burns could put an order in for a cervical/ spin xray ?  She is having some neck and back pain

## 2017-06-30 ENCOUNTER — Encounter: Payer: Self-pay | Admitting: Internal Medicine

## 2017-06-30 DIAGNOSIS — M542 Cervicalgia: Secondary | ICD-10-CM

## 2017-07-12 DIAGNOSIS — M542 Cervicalgia: Secondary | ICD-10-CM | POA: Diagnosis not present

## 2017-07-17 ENCOUNTER — Other Ambulatory Visit: Payer: Self-pay | Admitting: Cardiology

## 2017-07-17 MED FILL — ROSUVASTATIN CALCIUM 5 MG T: 5 | 84 days supply | Qty: 36 | Fill #0

## 2017-07-19 DIAGNOSIS — M542 Cervicalgia: Secondary | ICD-10-CM | POA: Diagnosis not present

## 2017-07-25 ENCOUNTER — Other Ambulatory Visit: Payer: PPO

## 2017-07-31 ENCOUNTER — Other Ambulatory Visit: Payer: PPO | Admitting: *Deleted

## 2017-07-31 DIAGNOSIS — M542 Cervicalgia: Secondary | ICD-10-CM | POA: Diagnosis not present

## 2017-07-31 DIAGNOSIS — I2510 Atherosclerotic heart disease of native coronary artery without angina pectoris: Secondary | ICD-10-CM | POA: Diagnosis not present

## 2017-07-31 DIAGNOSIS — E7800 Pure hypercholesterolemia, unspecified: Secondary | ICD-10-CM | POA: Diagnosis not present

## 2017-08-01 ENCOUNTER — Telehealth: Payer: Self-pay | Admitting: Internal Medicine

## 2017-08-01 DIAGNOSIS — M85852 Other specified disorders of bone density and structure, left thigh: Secondary | ICD-10-CM

## 2017-08-01 DIAGNOSIS — Z13820 Encounter for screening for osteoporosis: Secondary | ICD-10-CM

## 2017-08-01 DIAGNOSIS — M8580 Other specified disorders of bone density and structure, unspecified site: Secondary | ICD-10-CM

## 2017-08-01 LAB — COMPREHENSIVE METABOLIC PANEL
ALT: 18 IU/L (ref 0–32)
AST: 18 IU/L (ref 0–40)
Albumin/Globulin Ratio: 2.2 (ref 1.2–2.2)
Albumin: 4.3 g/dL (ref 3.5–4.8)
Alkaline Phosphatase: 67 IU/L (ref 39–117)
BUN/Creatinine Ratio: 22 (ref 12–28)
BUN: 18 mg/dL (ref 8–27)
Bilirubin Total: 0.4 mg/dL (ref 0.0–1.2)
CO2: 24 mmol/L (ref 20–29)
Calcium: 9.3 mg/dL (ref 8.7–10.3)
Chloride: 103 mmol/L (ref 96–106)
Creatinine, Ser: 0.81 mg/dL (ref 0.57–1.00)
GFR calc Af Amer: 85 mL/min/{1.73_m2} (ref >59)
GFR calc non Af Amer: 73 mL/min/{1.73_m2} (ref >59)
Globulin, Total: 2 g/dL (ref 1.5–4.5)
Glucose: 94 mg/dL (ref 65–99)
Potassium: 4.8 mmol/L (ref 3.5–5.2)
Sodium: 140 mmol/L (ref 134–144)
Total Protein: 6.3 g/dL (ref 6.0–8.5)

## 2017-08-01 LAB — NMR, LIPOPROFILE
Cholesterol: 186 mg/dL (ref 100–199)
HDL Cholesterol by NMR: 101 mg/dL (ref >39)
HDL Particle Number: 40.2 umol/L (ref >=30.5)
LDL Particle Number: 873 nmol/L (ref <1000)
LDL Size: 21.5 nm (ref >20.5)
LDL-C: 74 mg/dL (ref 0–99)
LP-IR Score: <25 (ref <=45)
Small LDL Particle Number: <90 nmol/L (ref <=527)
Triglycerides by NMR: 57 mg/dL (ref 0–149)

## 2017-08-01 LAB — APOLIPOPROTEIN A-1: Apolipoprotein A-1: 211 mg/dL — ABNORMAL HIGH (ref 116–209)

## 2017-08-01 LAB — APOLIPOPROTEIN B: Apolipoprotein B: 77 mg/dL (ref 54–133)

## 2017-08-01 NOTE — Telephone Encounter (Signed)
Valerie Phelps will you call and schedule this, I do not schedule bone densities.

## 2017-08-01 NOTE — Telephone Encounter (Signed)
Patient sent a request through mychart to schedule a dexa scan, can this be ordered?  Please advise  Last one 2015

## 2017-08-01 NOTE — Telephone Encounter (Signed)
Ordered - please call to schedule

## 2017-08-01 NOTE — Telephone Encounter (Signed)
Already did, sorry I didn't notate it.

## 2017-08-07 ENCOUNTER — Ambulatory Visit: Payer: PPO

## 2017-08-07 ENCOUNTER — Ambulatory Visit (INDEPENDENT_AMBULATORY_CARE_PROVIDER_SITE_OTHER): Payer: PPO

## 2017-08-07 ENCOUNTER — Ambulatory Visit (INDEPENDENT_AMBULATORY_CARE_PROVIDER_SITE_OTHER)
Admission: RE | Admit: 2017-08-07 | Discharge: 2017-08-07 | Disposition: A | Discharge status: Home / Self Care | Payer: PPO | Source: Ambulatory Visit | Attending: Internal Medicine | Admitting: Internal Medicine

## 2017-08-07 DIAGNOSIS — Z23 Encounter for immunization: Secondary | ICD-10-CM | POA: Diagnosis not present

## 2017-08-07 DIAGNOSIS — M85852 Other specified disorders of bone density and structure, left thigh: Secondary | ICD-10-CM | POA: Diagnosis not present

## 2017-08-07 DIAGNOSIS — Z13820 Encounter for screening for osteoporosis: Secondary | ICD-10-CM

## 2017-08-09 DIAGNOSIS — M542 Cervicalgia: Secondary | ICD-10-CM | POA: Diagnosis not present

## 2017-08-16 DIAGNOSIS — M542 Cervicalgia: Secondary | ICD-10-CM | POA: Diagnosis not present

## 2017-08-17 ENCOUNTER — Encounter: Payer: Self-pay | Admitting: Internal Medicine

## 2017-08-22 DIAGNOSIS — M542 Cervicalgia: Secondary | ICD-10-CM | POA: Diagnosis not present

## 2017-08-29 DIAGNOSIS — M542 Cervicalgia: Secondary | ICD-10-CM | POA: Diagnosis not present

## 2017-10-06 MED FILL — ROSUVASTATIN CALCIUM 5 MG T: 5 | 84 days supply | Qty: 36 | Fill #1

## 2017-10-30 MED FILL — BUPROPION HCL XL 150 MG TAB: 150 | 90 days supply | Qty: 90 | Fill #2

## 2017-10-31 DIAGNOSIS — H5213 Myopia, bilateral: Secondary | ICD-10-CM | POA: Diagnosis not present

## 2017-10-31 DIAGNOSIS — H2513 Age-related nuclear cataract, bilateral: Secondary | ICD-10-CM | POA: Diagnosis not present

## 2017-10-31 DIAGNOSIS — H52223 Regular astigmatism, bilateral: Secondary | ICD-10-CM | POA: Diagnosis not present

## 2017-10-31 DIAGNOSIS — H524 Presbyopia: Secondary | ICD-10-CM | POA: Diagnosis not present

## 2017-11-02 MED FILL — ESTRADIOL-NORETH 1.0-0.5 MG: 1-0.5 | 84 days supply | Qty: 84 | Fill #1

## 2017-11-09 MED FILL — DICLOFENAC SODIUM 75 MG TAB: 75 | 90 days supply | Qty: 180 | Fill #1

## 2017-12-18 ENCOUNTER — Encounter: Payer: PPO | Admitting: Internal Medicine

## 2017-12-19 NOTE — Progress Notes (Signed)
Subjective:    Patient ID: Valerie Dragon, MD, female    DOB: [birth year], 72 y.o.   MRN: [id number]  HPI She is here for a physical exam.   Chronic back and upper neck pain:  She will see ortho soon.  She has done official PT and continues to do the exercises.  She takes the voltaren daily, xanax as a muscle relaxer and tramadol.  At most she takes tramadol once a month. Her pain is chronic and she is hoping ortho can help.    She has no concerns.   Medications and allergies reviewed with patient and updated if appropriate.  Patient Active Problem List   Diagnosis Date Noted  . Joint pain 11/26/2016  . Vitamin D deficiency 11/25/2016  . Hyperlipidemia 05/19/2016  . Adjustment disorder with depressed mood 03/10/2016  . Pain in joint, lower leg 03/10/2016  . Onychomycosis 03/10/2016  . Tendinopathy of left rotator cuff 03/10/2016  . Aortic regurgitation 02/02/2016  . Osteopenia   . Achilles tendinitis of both lower extremities 12/05/2013  . Medial epicondylitis of left elbow 10/08/2013  . Personal history of colonic polyps-1 cm hyperplastic splenic flexure 11/29/2012  . Breast cancer ([place])   . Right ventricle   . CAD (coronary artery disease)   . Atrial bigeminy   . Aortic insufficiency   . Diastolic dysfunction   . Abnormal CT scan, chest   . Ejection fraction   . HIP PAIN, RIGHT 07/02/2010  . ADENOCARCINOMA, BREAST, ER POSITIVE 08/28/2009    Current Outpatient Medications on File Prior to Visit  Medication Sig Dispense Refill  . ALPRAZolam (XANAX) 0.25 MG tablet Take 1 tablet (0.25 mg total) by mouth at bedtime as needed for anxiety. 30 tablet 1  . buPROPion (WELLBUTRIN XL) 150 MG 24 hr tablet Take 1 tablet (150 mg total) by mouth daily. 90 tablet 3  . diclofenac (VOLTAREN) 75 MG EC tablet Take 1 tablet (75 mg total) by mouth 2 (two) times daily as needed for mild pain. 180 tablet 3  . estradiol-norethindrone (ACTIVELLA) 1-0.5 MG tablet Take 1 tablet by mouth daily. 84  tablet 1  . rosuvastatin (CRESTOR) 5 MG tablet TAKE 1 TABLET BY MOUTH 3 TIMES A WEEK. 54 tablet 7  . terbinafine (LAMISIL) 250 MG tablet Take 1 tablet (250 mg total) by mouth daily. 90 tablet 0  . traMADol (ULTRAM) 50 MG tablet Take 1 tablet (50 mg total) by mouth every 6 (six) hours as needed. 30 tablet 0   No current facility-administered medications on file prior to visit.     Past Medical History:  Diagnosis Date  . Abnormal CT scan, chest    Question of abnormal lymph nodes in the chest, biopsy was -2011, felt reactive adenopathy due to silicone implants (subsquently replaced)  . 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 Atrial bigeminy    Present at slow heart rate  . 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 CAD (coronary artery disease)    Cardiac CT,09/2010, calcium score 61, , small nidus LAD and RCA, and a below the knee  . Diastolic dysfunction    Stress echo, normal, November, 2012  . Ejection fraction    EF 60%, echo, November, 2012  . Osteopenia   . Personal history of colonic polyps-1 cm hyperplastic splenic flexure 11/29/2012  . Right ventricle    Question of mild right ventricular enlargement  in 2011 /   right  ventricular size seems normal  in November, 2012, there is no documented pulmonary hypertension.    Past Surgical History:  Procedure Laterality Date  . BREAST RECONSTRUCTION  [id number]   implants exchanged  . BREAST SURGERY  1995   rt and lt total mastectomies-reconstruction  . COLONOSCOPY    . FACIAL COSMETIC SURGERY    . LYMPH NODE BIOPSY    . MOUTH SURGERY    . OOPHORECTOMY  1982   rt  . Varicose vein injections      Social History   Socioeconomic History  . Marital status: Married    Spouse name: Darnell Level  . Number of children: 3  . Years of education: 4  . Highest education level: None  Social Needs  . Financial resource strain: None  . Food insecurity - worry: None  . Food insecurity - inability: None    . Transportation needs - medical: None  . Transportation needs - non-medical: None  Occupational History  . Occupation: physician/GI    Employer: [employer]    Comment: [employer]  Tobacco Use  . Smoking status: Never Smoker  . Smokeless tobacco: Never Used  Substance and Sexual Activity  . Alcohol use: Yes    Alcohol/week: 2.5 oz    Types: 5 drink(s) per week    Comment: occ  . Drug use: No  . Sexual activity: Yes    Partners: Male    Birth control/protection: Post-menopausal  Other Topics Concern  . None  Social History Narrative   [employer], [place]. Married - '71- 7 years/divorced; Married '86. 1 son - '85; 2 step-children. 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 ACP/end of life - full resiscitation, reasonable duration mechanical ventilatory support, no heroic/futile measures. HCPOA - spouse    Family History  Problem Relation Age of Onset  . Prostate cancer Father   . Breast cancer Maternal Aunt 61  . Breast cancer Paternal Grandmother 19  . Colon cancer Paternal Grandfather        rectal  . Osteoarthritis Sister   . Aneurysm Brother 57       brain, no intervention    Review of Systems  Constitutional: Negative for chills, fatigue and fever.  HENT: Positive for hearing loss.   Eyes: Negative for visual disturbance.  Respiratory: Negative for cough, shortness of breath and wheezing.   Cardiovascular: Negative for chest pain, palpitations and leg swelling.  Gastrointestinal: Positive for constipation (colace). Negative for abdominal pain, blood in stool and diarrhea.  Genitourinary: Negative for dysuria and hematuria.       Mild stress incontinence  Musculoskeletal: Positive for arthralgias and neck pain.  Skin: Negative for color change and rash.  Neurological: Positive for headaches. Negative for light-headedness.  Psychiatric/Behavioral: Positive for dysphoric mood (controlled). The patient is not  nervous/anxious.        Objective:   Vitals:   12/20/17 1406  BP: 138/78  Pulse: (!) 50  Resp: 16  Temp: 97.9 F (36.6 C)  SpO2: 98%   Filed Weights   12/20/17 1406  Weight: 127 lb (57.6 kg)   Body mass index is 21.13 kg/m.  Wt Readings from Last 3 Encounters:  12/20/17 127 lb (57.6 kg)  03/20/17 125 lb (56.7 kg)  11/25/16 126 lb (57.2 kg)     Physical Exam Constitutional: She appears well-developed and well-nourished. No distress.  HENT:  Head: Normocephalic and atraumatic.  Right Ear: External ear normal. Normal ear canal and TM  Left Ear: External ear normal.  Normal ear canal and TM Mouth/Throat: Oropharynx is clear and moist.  Eyes: Conjunctivae and EOM are normal.  Neck: Neck supple. No tracheal deviation present. No thyromegaly present.  No carotid bruit  Cardiovascular: Normal rate, regular rhythm and normal heart sounds.   No murmur heard.  No edema. Pulmonary/Chest: Effort normal and breath sounds normal. No respiratory distress. She has no wheezes. She has no rales.  Breast: deferred to Gyn Abdominal: Soft. She exhibits no distension. There is no tenderness.  Lymphadenopathy: She has no cervical adenopathy.  Skin: Skin is warm and dry. She is not diaphoretic.  Psychiatric: She has a normal mood and affect. Her behavior is normal.        Assessment & Plan:   Physical exam: Screening blood work   ordered Immunizations  Discussed shingrix, others up to date Colonoscopy   Up to date  Mammogram - s/p b/l mastectomy Gyn - no longer seeing gyn Dexa  Up to date  Eye exams   Up to date  Exercise  Regular - running 3 miles daily, weights 3 / weeks Weight  Normal BMI Skin   No concerns - sees dr lomax annually Substance abuse   none  See Problem List for Assessment and Plan of chronic medical problems.   FU in one year

## 2017-12-19 NOTE — Patient Instructions (Addendum)
Test(s) ordered today. Your results will be released to [facility] (or called to you) after review, usually within 72hours after test completion. If any changes need to be made, you will be notified at that same time.  All other Health Maintenance issues reviewed.   All recommended immunizations and age-appropriate screenings are up-to-date or discussed.  No immunizations administered today.   Medications reviewed and updated.  No changes recommended at this time.  Your prescription(s) have been submitted to your pharmacy. Please take as directed and contact our office if you believe you are having problem(s) with the medication(s).   Please followup in one year   Health Maintenance, Female Adopting a healthy lifestyle and getting preventive care can go a long way to promote health and wellness. Talk with your health care provider about what schedule of regular examinations is right for you. This is a good chance for you to check in with your provider about disease prevention and staying healthy. In between checkups, there are plenty of things you can do on your own. Experts have done a lot of research about which lifestyle changes and preventive measures are most likely to keep you healthy. Ask your health care provider for more information. Weight and diet Eat a healthy diet  Be sure to include plenty of vegetables, fruits, low-fat dairy products, and lean protein.  Do not eat a lot of foods high in solid fats, added sugars, or salt.  Get regular exercise. This is one of the most important things you can do for your health. ? Most adults should exercise for at least 150 minutes each week. The exercise should increase your heart rate and make you sweat (moderate-intensity exercise). ? Most adults should also do strengthening exercises at least twice a week. This is in addition to the moderate-intensity exercise.  Maintain a healthy weight  Body mass index (BMI) is a measurement that can  be used to identify possible weight problems. It estimates body fat based on height and weight. Your health care provider can help determine your BMI and help you achieve or maintain a healthy weight.  For females 37 years of age and older: ? A BMI below 18.5 is considered underweight. ? A BMI of 18.5 to 24.9 is normal. ? A BMI of 25 to 29.9 is considered overweight. ? A BMI of 30 and above is considered obese.  Watch levels of cholesterol and blood lipids  You should start having your blood tested for lipids and cholesterol at 72 years of age, then have this test every 5 years.  You may need to have your cholesterol levels checked more often if: ? Your lipid or cholesterol levels are high. ? You are older than 72 years of age. ? You are at high risk for heart disease.  Cancer screening Lung Cancer  Lung cancer screening is recommended for adults 67-73 years old who are at high risk for lung cancer because of a history of smoking.  A yearly low-dose CT scan of the lungs is recommended for people who: ? Currently smoke. ? Have quit within the past 15 years. ? Have at least a 30-pack-year history of smoking. A pack year is smoking an average of one pack of cigarettes a day for 1 year.  Yearly screening should continue until it has been 15 years since you quit.  Yearly screening should stop if you develop a health problem that would prevent you from having lung cancer treatment.  Breast Cancer  Practice breast self-awareness.  This means understanding how your breasts normally appear and feel.  It also means doing regular breast self-exams. Let your health care provider know about any changes, no matter how small.  If you are in your 20s or 30s, you should have a clinical breast exam (CBE) by a health care provider every 1-3 years as part of a regular health exam.  If you are 29 or older, have a CBE every year. Also consider having a breast X-ray (mammogram) every year.  If you  have a family history of breast cancer, talk to your health care provider about genetic screening.  If you are at high risk for breast cancer, talk to your health care provider about having an MRI and a mammogram every year.  Breast cancer gene (BRCA) assessment is recommended for women who have family members with BRCA-related cancers. BRCA-related cancers include: ? Breast. ? Ovarian. ? Tubal. ? Peritoneal cancers.  Results of the assessment will determine the need for genetic counseling and BRCA1 and BRCA2 testing.  Cervical Cancer Your health care provider may recommend that you be screened regularly for cancer of the pelvic organs (ovaries, uterus, and vagina). This screening involves a pelvic examination, including checking for microscopic changes to the surface of your cervix (Pap test). You may be encouraged to have this screening done every 3 years, beginning at age 40.  For women ages 44-65, health care providers may recommend pelvic exams and Pap testing every 3 years, or they may recommend the Pap and pelvic exam, combined with testing for human papilloma virus (HPV), every 5 years. Some types of HPV increase your risk of cervical cancer. Testing for HPV may also be done on women of any age with unclear Pap test results.  Other health care providers may not recommend any screening for nonpregnant women who are considered low risk for pelvic cancer and who do not have symptoms. Ask your health care provider if a screening pelvic exam is right for you.  If you have had past treatment for cervical cancer or a condition that could lead to cancer, you need Pap tests and screening for cancer for at least 20 years after your treatment. If Pap tests have been discontinued, your risk factors (such as having a new sexual partner) need to be reassessed to determine if screening should resume. Some women have medical problems that increase the chance of getting cervical cancer. In these cases,  your health care provider may recommend more frequent screening and Pap tests.  Colorectal Cancer  This type of cancer can be detected and often prevented.  Routine colorectal cancer screening usually begins at 72 years of age and continues through 72 years of age.  Your health care provider may recommend screening at an earlier age if you have risk factors for colon cancer.  Your health care provider may also recommend using home test kits to check for hidden blood in the stool.  A small camera at the end of a tube can be used to examine your colon directly (sigmoidoscopy or colonoscopy). This is done to check for the earliest forms of colorectal cancer.  Routine screening usually begins at age 96.  Direct examination of the colon should be repeated every 5-10 years through 72 years of age. However, you may need to be screened more often if early forms of precancerous polyps or small growths are found.  Skin Cancer  Check your skin from head to toe regularly.  Tell your health care provider about any  new moles or changes in moles, especially if there is a change in a mole's shape or color.  Also tell your health care provider if you have a mole that is larger than the size of a pencil eraser.  Always use sunscreen. Apply sunscreen liberally and repeatedly throughout the day.  Protect yourself by wearing long sleeves, pants, a wide-brimmed hat, and sunglasses whenever you are outside.  Heart disease, diabetes, and high blood pressure  High blood pressure causes heart disease and increases the risk of stroke. High blood pressure is more likely to develop in: ? People who have blood pressure in the high end of the normal range (130-139/85-89 mm Hg). ? People who are overweight or obese. ? People who are African American.  If you are 58-1 years of age, have your blood pressure checked every 3-5 years. If you are 52 years of age or older, have your blood pressure checked every year.  You should have your blood pressure measured twice-once when you are at a hospital or clinic, and once when you are not at a hospital or clinic. Record the average of the two measurements. To check your blood pressure when you are not at a hospital or clinic, you can use: ? An automated blood pressure machine at a pharmacy. ? A home blood pressure monitor.  If you are between 24 years and 65 years old, ask your health care provider if you should take aspirin to prevent strokes.  Have regular diabetes screenings. This involves taking a blood sample to check your fasting blood sugar level. ? If you are at a normal weight and have a low risk for diabetes, have this test once every three years after 72 years of age. ? If you are overweight and have a high risk for diabetes, consider being tested at a younger age or more often. Preventing infection Hepatitis B  If you have a higher risk for hepatitis B, you should be screened for this virus. You are considered at high risk for hepatitis B if: ? You were born in a country where hepatitis B is common. Ask your health care provider which countries are considered high risk. ? Your parents were born in a high-risk country, and you have not been immunized against hepatitis B (hepatitis B vaccine). ? You have HIV or AIDS. ? You use needles to inject street drugs. ? You live with someone who has hepatitis B. ? You have had sex with someone who has hepatitis B. ? You get hemodialysis treatment. ? You take certain medicines for conditions, including cancer, organ transplantation, and autoimmune conditions.  Hepatitis C  Blood testing is recommended for: ? Everyone born from [birth year] through 1965. ? Anyone with known risk factors for hepatitis C.  Sexually transmitted infections (STIs)  You should be screened for sexually transmitted infections (STIs) including gonorrhea and chlamydia if: ? You are sexually active and are younger than 72 years of  age. ? You are older than 72 years of age and your health care provider tells you that you are at risk for this type of infection. ? Your sexual activity has changed since you were last screened and you are at an increased risk for chlamydia or gonorrhea. Ask your health care provider if you are at risk.  If you do not have HIV, but are at risk, it may be recommended that you take a prescription medicine daily to prevent HIV infection. This is called pre-exposure prophylaxis (PrEP). You are considered at  risk if: ? You are sexually active and do not regularly use condoms or know the HIV status of your partner(s). ? You take drugs by injection. ? You are sexually active with a partner who has HIV.  Talk with your health care provider about whether you are at high risk of being infected with HIV. If you choose to begin PrEP, you should first be tested for HIV. You should then be tested every 3 months for as long as you are taking PrEP. Pregnancy  If you are premenopausal and you may become pregnant, ask your health care provider about preconception counseling.  If you may become pregnant, take 400 to 800 micrograms (mcg) of folic acid every day.  If you want to prevent pregnancy, talk to your health care provider about birth control (contraception). Osteoporosis and menopause  Osteoporosis is a disease in which the bones lose minerals and strength with aging. This can result in serious bone fractures. Your risk for osteoporosis can be identified using a bone density scan.  If you are 6 years of age or older, or if you are at risk for osteoporosis and fractures, ask your health care provider if you should be screened.  Ask your health care provider whether you should take a calcium or vitamin D supplement to lower your risk for osteoporosis.  Menopause may have certain physical symptoms and risks.  Hormone replacement therapy may reduce some of these symptoms and risks. Talk to your health  care provider about whether hormone replacement therapy is right for you. Follow these instructions at home:  Schedule regular health, dental, and eye exams.  Stay current with your immunizations.  Do not use any tobacco products including cigarettes, chewing tobacco, or electronic cigarettes.  If you are pregnant, do not drink alcohol.  If you are breastfeeding, limit how much and how often you drink alcohol.  Limit alcohol intake to no more than 1 drink per day for nonpregnant women. One drink equals 12 ounces of beer, 5 ounces of wine, or 1 ounces of hard liquor.  Do not use street drugs.  Do not share needles.  Ask your health care provider for help if you need support or information about quitting drugs.  Tell your health care provider if you often feel depressed.  Tell your health care provider if you have ever been abused or do not feel safe at home. This information is not intended to replace advice given to you by your health care provider. Make sure you discuss any questions you have with your health care provider. Document Released: 05/23/2011 Document Revised: 04/14/2016 Document Reviewed: 08/11/2015 Elsevier Interactive Patient Education  [employer].

## 2017-12-20 ENCOUNTER — Encounter: Payer: Self-pay | Admitting: Internal Medicine

## 2017-12-20 ENCOUNTER — Ambulatory Visit (INDEPENDENT_AMBULATORY_CARE_PROVIDER_SITE_OTHER): Payer: PPO | Admitting: Internal Medicine

## 2017-12-20 VITALS — BP 138/78 | HR 50 | Temp 97.9°F | Resp 16 | Wt 127.0 lb

## 2017-12-20 DIAGNOSIS — M542 Cervicalgia: Secondary | ICD-10-CM | POA: Diagnosis not present

## 2017-12-20 DIAGNOSIS — E559 Vitamin D deficiency, unspecified: Secondary | ICD-10-CM | POA: Diagnosis not present

## 2017-12-20 DIAGNOSIS — F341 Dysthymic disorder: Secondary | ICD-10-CM

## 2017-12-20 DIAGNOSIS — E7849 Other hyperlipidemia: Secondary | ICD-10-CM

## 2017-12-20 DIAGNOSIS — Z0000 Encounter for general adult medical examination without abnormal findings: Secondary | ICD-10-CM | POA: Diagnosis not present

## 2017-12-20 MED ORDER — ROSUVASTATIN CALCIUM 5 MG PO TABS: ORAL_TABLET | ORAL | 7 refills | Status: DC

## 2017-12-20 MED ORDER — BUPROPION HCL ER (XL) 150 MG PO TB24: 150.0000 mg | ORAL_TABLET | Freq: Every day | ORAL | 3 refills | Status: DC

## 2017-12-20 MED ORDER — ALPRAZOLAM 0.25 MG PO TABS: 0.2500 mg | ORAL_TABLET | Freq: Every evening | ORAL | 3 refills | Status: DC | PRN

## 2017-12-20 MED ORDER — ZOSTER VAC RECOMB ADJUVANTED 50 MCG/0.5ML IM SUSR: 0.5000 mL | Freq: Once | INTRAMUSCULAR | 1 refills | Status: AC

## 2017-12-20 MED ORDER — TRAMADOL HCL 50 MG PO TABS: 50.0000 mg | ORAL_TABLET | Freq: Four times a day (QID) | ORAL | 0 refills | Status: DC | PRN

## 2017-12-20 MED FILL — ALPRAZolam 0.25 MG TABS: 0.25 | 30 days supply | Qty: 30 | Fill #0

## 2017-12-20 MED FILL — traMADol HCL 50 MG TABS: 50 | 7 days supply | Qty: 30 | Fill #0

## 2017-12-20 MED FILL — ROSUVASTATIN CALCIUM 5 MG T: 5 | 84 days supply | Qty: 36 | Fill #0

## 2017-12-20 NOTE — Assessment & Plan Note (Signed)
Mild depression She started back on wellbutrin, which as worked well in the past  Continue wellbutrin

## 2017-12-20 NOTE — Assessment & Plan Note (Signed)
Chronic neck pain, upper back pain Taking tramadol rarely Taking xanax on occasion for muscle relaxer Taking voltaren daily Will see ortho

## 2017-12-20 NOTE — Assessment & Plan Note (Signed)
Taking vitamin d daily Check level 

## 2017-12-20 NOTE — Assessment & Plan Note (Signed)
Lipids monitored by cardiology Continue crestor

## 2017-12-21 ENCOUNTER — Other Ambulatory Visit (INDEPENDENT_AMBULATORY_CARE_PROVIDER_SITE_OTHER): Payer: PPO

## 2017-12-21 ENCOUNTER — Encounter: Payer: Self-pay | Admitting: Internal Medicine

## 2017-12-21 DIAGNOSIS — E7849 Other hyperlipidemia: Secondary | ICD-10-CM | POA: Diagnosis not present

## 2017-12-21 DIAGNOSIS — Z0000 Encounter for general adult medical examination without abnormal findings: Secondary | ICD-10-CM

## 2017-12-21 DIAGNOSIS — E559 Vitamin D deficiency, unspecified: Secondary | ICD-10-CM | POA: Diagnosis not present

## 2017-12-21 LAB — COMPREHENSIVE METABOLIC PANEL
ALT: 15 U/L (ref 0–35)
AST: 17 U/L (ref 0–37)
Albumin: 4.4 g/dL (ref 3.5–5.2)
Alkaline Phosphatase: 60 U/L (ref 39–117)
BUN: 28 mg/dL — AB (ref 6–23)
CO2: 28 meq/L (ref 19–32)
CREATININE: 0.94 mg/dL (ref 0.40–1.20)
Calcium: 9.5 mg/dL (ref 8.4–10.5)
Chloride: 106 mEq/L (ref 96–112)
GFR: 62.22 mL/min (ref >60.00)
Glucose, Bld: 100 mg/dL — ABNORMAL HIGH (ref 70–99)
Potassium: 4.7 mEq/L (ref 3.5–5.1)
SODIUM: 140 meq/L (ref 135–145)
Total Bilirubin: 0.5 mg/dL (ref 0.2–1.2)
Total Protein: 7.1 g/dL (ref 6.0–8.3)

## 2017-12-21 LAB — CBC WITH DIFFERENTIAL/PLATELET
BASOS PCT: 0.6 % (ref 0.0–3.0)
Basophils Absolute: 0 10*3/uL (ref 0.0–0.1)
Eosinophils Absolute: 0.2 10*3/uL (ref 0.0–0.7)
Eosinophils Relative: 5.1 % — ABNORMAL HIGH (ref 0.0–5.0)
HCT: 43.2 % (ref 36.0–46.0)
HEMOGLOBIN: 14.2 g/dL (ref 12.0–15.0)
LYMPHS ABS: 1.2 10*3/uL (ref 0.7–4.0)
Lymphocytes Relative: 26.4 % (ref 12.0–46.0)
MCHC: 32.9 g/dL (ref 30.0–36.0)
MCV: 91.7 fl (ref 78.0–100.0)
MONO ABS: 0.5 10*3/uL (ref 0.1–1.0)
Monocytes Relative: 10.2 % (ref 3.0–12.0)
Neutro Abs: 2.6 10*3/uL (ref 1.4–7.7)
Neutrophils Relative %: 57.7 % (ref 43.0–77.0)
Platelets: 217 10*3/uL (ref 150.0–400.0)
RBC: 4.71 Mil/uL (ref 3.87–5.11)
RDW: 13.5 % (ref 11.5–15.5)
WBC: 4.4 10*3/uL (ref 4.0–10.5)

## 2017-12-21 LAB — VITAMIN D 25 HYDROXY (VIT D DEFICIENCY, FRACTURES): VITD: 45.36 ng/mL (ref 30.00–100.00)

## 2017-12-21 LAB — TSH: TSH: 3.97 u[IU]/mL (ref 0.35–4.50)

## 2018-01-01 DIAGNOSIS — M25512 Pain in left shoulder: Secondary | ICD-10-CM | POA: Diagnosis not present

## 2018-01-01 MED FILL — SHINGRIX VIAL KIT: 50 | 1 days supply | Qty: 1 | Fill #0

## 2018-01-26 DIAGNOSIS — L812 Freckles: Secondary | ICD-10-CM | POA: Diagnosis not present

## 2018-01-26 DIAGNOSIS — D2271 Melanocytic nevi of right lower limb, including hip: Secondary | ICD-10-CM | POA: Diagnosis not present

## 2018-01-26 DIAGNOSIS — L570 Actinic keratosis: Secondary | ICD-10-CM | POA: Diagnosis not present

## 2018-01-26 DIAGNOSIS — D1801 Hemangioma of skin and subcutaneous tissue: Secondary | ICD-10-CM | POA: Diagnosis not present

## 2018-01-26 DIAGNOSIS — L814 Other melanin hyperpigmentation: Secondary | ICD-10-CM | POA: Diagnosis not present

## 2018-01-26 DIAGNOSIS — Z85828 Personal history of other malignant neoplasm of skin: Secondary | ICD-10-CM | POA: Diagnosis not present

## 2018-01-26 DIAGNOSIS — L821 Other seborrheic keratosis: Secondary | ICD-10-CM | POA: Diagnosis not present

## 2018-01-26 DIAGNOSIS — D2272 Melanocytic nevi of left lower limb, including hip: Secondary | ICD-10-CM | POA: Diagnosis not present

## 2018-02-07 MED FILL — buPROPion HCL ER (XL) 150 M: 150 | 90 days supply | Qty: 90 | Fill #0

## 2018-02-13 ENCOUNTER — Encounter: Payer: Self-pay | Admitting: Cardiology

## 2018-02-26 ENCOUNTER — Telehealth: Payer: Self-pay | Admitting: Emergency Medicine

## 2018-02-26 NOTE — Telephone Encounter (Signed)
Called patient to schedule AWV. Patient did not answer. Will try to call patient back at a later time.

## 2018-03-05 ENCOUNTER — Ambulatory Visit (HOSPITAL_COMMUNITY): Payer: PPO | Attending: Cardiology

## 2018-03-05 ENCOUNTER — Other Ambulatory Visit: Payer: Self-pay

## 2018-03-05 DIAGNOSIS — I498 Other specified cardiac arrhythmias: Secondary | ICD-10-CM | POA: Insufficient documentation

## 2018-03-05 DIAGNOSIS — I272 Pulmonary hypertension, unspecified: Secondary | ICD-10-CM | POA: Insufficient documentation

## 2018-03-05 DIAGNOSIS — I351 Nonrheumatic aortic (valve) insufficiency: Secondary | ICD-10-CM | POA: Diagnosis not present

## 2018-03-05 DIAGNOSIS — E785 Hyperlipidemia, unspecified: Secondary | ICD-10-CM | POA: Diagnosis not present

## 2018-03-05 DIAGNOSIS — C50919 Malignant neoplasm of unspecified site of unspecified female breast: Secondary | ICD-10-CM | POA: Insufficient documentation

## 2018-03-07 MED FILL — SHINGRIX VIAL KIT: 50 | 1 days supply | Qty: 1 | Fill #1

## 2018-03-19 ENCOUNTER — Telehealth: Payer: Self-pay | Admitting: *Deleted

## 2018-03-19 DIAGNOSIS — Q231 Congenital insufficiency of aortic valve: Secondary | ICD-10-CM | POA: Insufficient documentation

## 2018-03-19 DIAGNOSIS — I351 Nonrheumatic aortic (valve) insufficiency: Secondary | ICD-10-CM

## 2018-03-19 NOTE — Telephone Encounter (Signed)
Pt already made aware of previous echo results and plan per Dr Meda Coffee, to have this repeated in March of 2020. Echo is ordered in the system and [facility] scheduling will call the pt and arrange this study to be repeated in March 2020.

## 2018-03-19 NOTE — Telephone Encounter (Signed)
-----   Message from Dorothy Spark, MD sent at 03/18/2018  9:51 AM EDT ----- Hi, Could you please arrange for a follow up echo on March of 2010? 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 Thank you, Houston Siren

## 2018-03-22 NOTE — Telephone Encounter (Signed)
Echo is scheduled for this pt to have repeated on 02/11/19.  Pt made aware of appt date and time by [facility] scheduling.

## 2018-03-28 DIAGNOSIS — M542 Cervicalgia: Secondary | ICD-10-CM | POA: Diagnosis not present

## 2018-03-29 ENCOUNTER — Other Ambulatory Visit: Payer: Self-pay | Admitting: Internal Medicine

## 2018-03-29 MED FILL — ESTRADIOL-NORETH 1.0-0.5 MG: 1-0.5 | 84 days supply | Qty: 84 | Fill #0

## 2018-03-30 MED FILL — ROSUVASTATIN CALCIUM 5 MG T: 5 | 84 days supply | Qty: 36 | Fill #1

## 2018-04-04 DIAGNOSIS — M542 Cervicalgia: Secondary | ICD-10-CM | POA: Diagnosis not present

## 2018-04-11 DIAGNOSIS — M542 Cervicalgia: Secondary | ICD-10-CM | POA: Diagnosis not present

## 2018-04-19 DIAGNOSIS — M542 Cervicalgia: Secondary | ICD-10-CM | POA: Diagnosis not present

## 2018-05-08 MED FILL — BUPROPION HCL XL 150 MG TAB: 150 | 90 days supply | Qty: 90 | Fill #1

## 2018-06-19 DIAGNOSIS — M25512 Pain in left shoulder: Secondary | ICD-10-CM | POA: Diagnosis not present

## 2018-06-19 DIAGNOSIS — M25511 Pain in right shoulder: Secondary | ICD-10-CM | POA: Diagnosis not present

## 2018-06-25 MED FILL — ROSUVASTATIN CALCIUM 5 MG T: 5 | 84 days supply | Qty: 36 | Fill #2

## 2018-06-27 DIAGNOSIS — M25512 Pain in left shoulder: Secondary | ICD-10-CM | POA: Diagnosis not present

## 2018-07-11 DIAGNOSIS — M25511 Pain in right shoulder: Secondary | ICD-10-CM | POA: Diagnosis not present

## 2018-07-11 DIAGNOSIS — M25512 Pain in left shoulder: Secondary | ICD-10-CM | POA: Diagnosis not present

## 2018-07-12 DIAGNOSIS — H52223 Regular astigmatism, bilateral: Secondary | ICD-10-CM | POA: Diagnosis not present

## 2018-07-12 DIAGNOSIS — H5212 Myopia, left eye: Secondary | ICD-10-CM | POA: Diagnosis not present

## 2018-07-12 DIAGNOSIS — H5201 Hypermetropia, right eye: Secondary | ICD-10-CM | POA: Diagnosis not present

## 2018-07-12 DIAGNOSIS — H40053 Ocular hypertension, bilateral: Secondary | ICD-10-CM | POA: Diagnosis not present

## 2018-07-12 DIAGNOSIS — H40013 Open angle with borderline findings, low risk, bilateral: Secondary | ICD-10-CM | POA: Diagnosis not present

## 2018-07-12 DIAGNOSIS — H524 Presbyopia: Secondary | ICD-10-CM | POA: Diagnosis not present

## 2018-07-13 DIAGNOSIS — G8918 Other acute postprocedural pain: Secondary | ICD-10-CM | POA: Diagnosis not present

## 2018-07-13 DIAGNOSIS — M24112 Other articular cartilage disorders, left shoulder: Secondary | ICD-10-CM | POA: Diagnosis not present

## 2018-07-13 DIAGNOSIS — M7542 Impingement syndrome of left shoulder: Secondary | ICD-10-CM | POA: Diagnosis not present

## 2018-07-13 DIAGNOSIS — M7502 Adhesive capsulitis of left shoulder: Secondary | ICD-10-CM | POA: Diagnosis not present

## 2018-07-13 DIAGNOSIS — S43432A Superior glenoid labrum lesion of left shoulder, initial encounter: Secondary | ICD-10-CM | POA: Diagnosis not present

## 2018-07-13 DIAGNOSIS — M66822 Spontaneous rupture of other tendons, left upper arm: Secondary | ICD-10-CM | POA: Diagnosis not present

## 2018-07-13 DIAGNOSIS — M7552 Bursitis of left shoulder: Secondary | ICD-10-CM | POA: Diagnosis not present

## 2018-07-13 DIAGNOSIS — M65812 Other synovitis and tenosynovitis, left shoulder: Secondary | ICD-10-CM | POA: Diagnosis not present

## 2018-07-13 DIAGNOSIS — M75122 Complete rotator cuff tear or rupture of left shoulder, not specified as traumatic: Secondary | ICD-10-CM | POA: Diagnosis not present

## 2018-07-13 DIAGNOSIS — M19012 Primary osteoarthritis, left shoulder: Secondary | ICD-10-CM | POA: Diagnosis not present

## 2018-07-13 DIAGNOSIS — M94222 Chondromalacia, left elbow: Secondary | ICD-10-CM | POA: Diagnosis not present

## 2018-07-13 MED FILL — CYCLOBENZAPRINE 10 MG TAB: 10 | 8 days supply | Qty: 30 | Fill #0

## 2018-07-13 MED FILL — ONDANSETRON HCL 4 MG TABLET: 4 | 30 days supply | Qty: 10 | Fill #0

## 2018-07-13 MED FILL — DICLOFENAC SODIUM 75 MG TAB: 75 | 30 days supply | Qty: 60 | Fill #0

## 2018-07-13 MED FILL — OXYCODONE-ACETAMINOPHEN 5-3: 5-325 | 5 days supply | Qty: 30 | Fill #0

## 2018-07-20 DIAGNOSIS — M25512 Pain in left shoulder: Secondary | ICD-10-CM | POA: Diagnosis not present

## 2018-07-24 DIAGNOSIS — M25512 Pain in left shoulder: Secondary | ICD-10-CM | POA: Diagnosis not present

## 2018-07-31 DIAGNOSIS — M25512 Pain in left shoulder: Secondary | ICD-10-CM | POA: Diagnosis not present

## 2018-07-31 MED FILL — buPROPion HCL ER (XL) 150 M: 150 | 90 days supply | Qty: 90 | Fill #2

## 2018-08-14 DIAGNOSIS — M25512 Pain in left shoulder: Secondary | ICD-10-CM | POA: Diagnosis not present

## 2018-08-21 DIAGNOSIS — M25512 Pain in left shoulder: Secondary | ICD-10-CM | POA: Diagnosis not present

## 2018-08-27 MED FILL — ESTRADIOL-NORETHINDRONE ACE: 1-0.5 | 84 days supply | Qty: 84 | Fill #1

## 2018-08-31 DIAGNOSIS — M25512 Pain in left shoulder: Secondary | ICD-10-CM | POA: Diagnosis not present

## 2018-09-04 DIAGNOSIS — M25512 Pain in left shoulder: Secondary | ICD-10-CM | POA: Diagnosis not present

## 2018-09-06 DIAGNOSIS — M25512 Pain in left shoulder: Secondary | ICD-10-CM | POA: Diagnosis not present

## 2018-09-07 ENCOUNTER — Encounter: Payer: Self-pay | Admitting: Internal Medicine

## 2018-09-11 ENCOUNTER — Ambulatory Visit (INDEPENDENT_AMBULATORY_CARE_PROVIDER_SITE_OTHER): Payer: PPO

## 2018-09-11 DIAGNOSIS — Z23 Encounter for immunization: Secondary | ICD-10-CM | POA: Diagnosis not present

## 2018-09-11 DIAGNOSIS — M25512 Pain in left shoulder: Secondary | ICD-10-CM | POA: Diagnosis not present

## 2018-09-18 ENCOUNTER — Other Ambulatory Visit: Payer: Self-pay | Admitting: Internal Medicine

## 2018-09-18 DIAGNOSIS — M25512 Pain in left shoulder: Secondary | ICD-10-CM | POA: Diagnosis not present

## 2018-09-18 MED FILL — traMADol HCL 50 MG TABS: 50 | 7 days supply | Qty: 30 | Fill #0

## 2018-09-18 NOTE — Telephone Encounter (Signed)
Check [place] registry last filled 12/20/2017.Marland KitchenJohny Phelps

## 2018-09-19 NOTE — Telephone Encounter (Signed)
MD approved and sent electronically to pof../lmb  

## 2018-09-20 DIAGNOSIS — M25512 Pain in left shoulder: Secondary | ICD-10-CM | POA: Diagnosis not present

## 2018-09-25 DIAGNOSIS — M25512 Pain in left shoulder: Secondary | ICD-10-CM | POA: Diagnosis not present

## 2018-09-25 MED FILL — ROSUVASTATIN CALCIUM 5 MG T: 5 | 84 days supply | Qty: 36 | Fill #3

## 2018-09-28 DIAGNOSIS — M25512 Pain in left shoulder: Secondary | ICD-10-CM | POA: Diagnosis not present

## 2018-10-01 DIAGNOSIS — M25512 Pain in left shoulder: Secondary | ICD-10-CM | POA: Diagnosis not present

## 2018-10-11 DIAGNOSIS — M25512 Pain in left shoulder: Secondary | ICD-10-CM | POA: Diagnosis not present

## 2018-10-22 DIAGNOSIS — Z5189 Encounter for other specified aftercare: Secondary | ICD-10-CM | POA: Diagnosis not present

## 2018-10-22 DIAGNOSIS — M7541 Impingement syndrome of right shoulder: Secondary | ICD-10-CM | POA: Diagnosis not present

## 2018-10-22 DIAGNOSIS — M25511 Pain in right shoulder: Secondary | ICD-10-CM | POA: Diagnosis not present

## 2018-11-12 ENCOUNTER — Other Ambulatory Visit: Payer: Self-pay | Admitting: Internal Medicine

## 2018-11-12 MED FILL — DICLOFENAC SODIUM 75 MG TAB: 75 | 90 days supply | Qty: 180 | Fill #0

## 2018-11-12 MED FILL — buPROPion HCL ER (XL) 150 M: 150 | 90 days supply | Qty: 90 | Fill #3

## 2018-12-04 ENCOUNTER — Ambulatory Visit: Payer: PPO | Admitting: Internal Medicine

## 2018-12-06 DIAGNOSIS — H25813 Combined forms of age-related cataract, bilateral: Secondary | ICD-10-CM | POA: Diagnosis not present

## 2018-12-06 DIAGNOSIS — H5213 Myopia, bilateral: Secondary | ICD-10-CM | POA: Diagnosis not present

## 2018-12-06 DIAGNOSIS — H52223 Regular astigmatism, bilateral: Secondary | ICD-10-CM | POA: Diagnosis not present

## 2018-12-06 DIAGNOSIS — H40053 Ocular hypertension, bilateral: Secondary | ICD-10-CM | POA: Diagnosis not present

## 2018-12-06 DIAGNOSIS — H40013 Open angle with borderline findings, low risk, bilateral: Secondary | ICD-10-CM | POA: Diagnosis not present

## 2018-12-06 DIAGNOSIS — H524 Presbyopia: Secondary | ICD-10-CM | POA: Diagnosis not present

## 2018-12-19 DIAGNOSIS — M25511 Pain in right shoulder: Secondary | ICD-10-CM | POA: Diagnosis not present

## 2018-12-19 DIAGNOSIS — M7541 Impingement syndrome of right shoulder: Secondary | ICD-10-CM | POA: Diagnosis not present

## 2018-12-20 NOTE — Patient Instructions (Addendum)
Tests ordered today. Your results will be released to MyChart (or called to you) after review, usually within 72hours after test completion. If any changes need to be made, you will be notified at that same time.  All other Health Maintenance issues reviewed.   All recommended immunizations and age-appropriate screenings are up-to-date or discussed.  No immunizations administered today.   Medications reviewed and updated.  Changes include :   none  Your prescription(s) have been submitted to your pharmacy. Please take as directed and contact our office if you believe you are having problem(s) with the medication(s).   Please followup in one year   Health Maintenance, Female Adopting a healthy lifestyle and getting preventive care can go a long way to promote health and wellness. Talk with your health care provider about what schedule of regular examinations is right for you. This is a good chance for you to check in with your provider about disease prevention and staying healthy. In between checkups, there are plenty of things you can do on your own. Experts have done a lot of research about which lifestyle changes and preventive measures are most likely to keep you healthy. Ask your health care provider for more information. Weight and diet Eat a healthy diet  Be sure to include plenty of vegetables, fruits, low-fat dairy products, and lean protein.  Do not eat a lot of foods high in solid fats, added sugars, or salt.  Get regular exercise. This is one of the most important things you can do for your health. ? Most adults should exercise for at least 150 minutes each week. The exercise should increase your heart rate and make you sweat (moderate-intensity exercise). ? Most adults should also do strengthening exercises at least twice a week. This is in addition to the moderate-intensity exercise. Maintain a healthy weight  Body mass index (BMI) is a measurement that can be used to  identify possible weight problems. It estimates body fat based on height and weight. Your health care provider can help determine your BMI and help you achieve or maintain a healthy weight.  For females 20 years of age and older: ? A BMI below 18.5 is considered underweight. ? A BMI of 18.5 to 24.9 is normal. ? A BMI of 25 to 29.9 is considered overweight. ? A BMI of 30 and above is considered obese. Watch levels of cholesterol and blood lipids  You should start having your blood tested for lipids and cholesterol at 73 years of age, then have this test every 5 years.  You may need to have your cholesterol levels checked more often if: ? Your lipid or cholesterol levels are high. ? You are older than 73 years of age. ? You are at high risk for heart disease. Cancer screening Lung Cancer  Lung cancer screening is recommended for adults 55-80 years old who are at high risk for lung cancer because of a history of smoking.  A yearly low-dose CT scan of the lungs is recommended for people who: ? Currently smoke. ? Have quit within the past 15 years. ? Have at least a 30-pack-year history of smoking. A pack year is smoking an average of one pack of cigarettes a day for 1 year.  Yearly screening should continue until it has been 15 years since you quit.  Yearly screening should stop if you develop a health problem that would prevent you from having lung cancer treatment. Breast Cancer  Practice breast self-awareness. This means understanding how   your breasts normally appear and feel.  It also means doing regular breast self-exams. Let your health care provider know about any changes, no matter how small.  If you are in your 20s or 30s, you should have a clinical breast exam (CBE) by a health care provider every 1-3 years as part of a regular health exam.  If you are 40 or older, have a CBE every year. Also consider having a breast X-ray (mammogram) every year.  If you have a family  history of breast cancer, talk to your health care provider about genetic screening.  If you are at high risk for breast cancer, talk to your health care provider about having an MRI and a mammogram every year.  Breast cancer gene (BRCA) assessment is recommended for women who have family members with BRCA-related cancers. BRCA-related cancers include: ? Breast. ? Ovarian. ? Tubal. ? Peritoneal cancers.  Results of the assessment will determine the need for genetic counseling and BRCA1 and BRCA2 testing. Cervical Cancer Your health care provider may recommend that you be screened regularly for cancer of the pelvic organs (ovaries, uterus, and vagina). This screening involves a pelvic examination, including checking for microscopic changes to the surface of your cervix (Pap test). You may be encouraged to have this screening done every 3 years, beginning at age 21.  For women ages 30-65, health care providers may recommend pelvic exams and Pap testing every 3 years, or they may recommend the Pap and pelvic exam, combined with testing for human papilloma virus (HPV), every 5 years. Some types of HPV increase your risk of cervical cancer. Testing for HPV may also be done on women of any age with unclear Pap test results.  Other health care providers may not recommend any screening for nonpregnant women who are considered low risk for pelvic cancer and who do not have symptoms. Ask your health care provider if a screening pelvic exam is right for you.  If you have had past treatment for cervical cancer or a condition that could lead to cancer, you need Pap tests and screening for cancer for at least 20 years after your treatment. If Pap tests have been discontinued, your risk factors (such as having a new sexual partner) need to be reassessed to determine if screening should resume. Some women have medical problems that increase the chance of getting cervical cancer. In these cases, your health care  provider may recommend more frequent screening and Pap tests. Colorectal Cancer  This type of cancer can be detected and often prevented.  Routine colorectal cancer screening usually begins at 73 years of age and continues through 73 years of age.  Your health care provider may recommend screening at an earlier age if you have risk factors for colon cancer.  Your health care provider may also recommend using home test kits to check for hidden blood in the stool.  A small camera at the end of a tube can be used to examine your colon directly (sigmoidoscopy or colonoscopy). This is done to check for the earliest forms of colorectal cancer.  Routine screening usually begins at age 50.  Direct examination of the colon should be repeated every 5-10 years through 73 years of age. However, you may need to be screened more often if early forms of precancerous polyps or small growths are found. Skin Cancer  Check your skin from head to toe regularly.  Tell your health care provider about any new moles or changes in moles, especially   if there is a change in a mole's shape or color.  Also tell your health care provider if you have a mole that is larger than the size of a pencil eraser.  Always use sunscreen. Apply sunscreen liberally and repeatedly throughout the day.  Protect yourself by wearing long sleeves, pants, a wide-brimmed hat, and sunglasses whenever you are outside. Heart disease, diabetes, and high blood pressure  High blood pressure causes heart disease and increases the risk of stroke. High blood pressure is more likely to develop in: ? People who have blood pressure in the high end of the normal range (130-139/85-89 mm Hg). ? People who are overweight or obese. ? People who are African American.  If you are 18-39 years of age, have your blood pressure checked every 3-5 years. If you are 40 years of age or older, have your blood pressure checked every year. You should have your  blood pressure measured twice-once when you are at a hospital or clinic, and once when you are not at a hospital or clinic. Record the average of the two measurements. To check your blood pressure when you are not at a hospital or clinic, you can use: ? An automated blood pressure machine at a pharmacy. ? A home blood pressure monitor.  If you are between 55 years and 79 years old, ask your health care provider if you should take aspirin to prevent strokes.  Have regular diabetes screenings. This involves taking a blood sample to check your fasting blood sugar level. ? If you are at a normal weight and have a low risk for diabetes, have this test once every three years after 73 years of age. ? If you are overweight and have a high risk for diabetes, consider being tested at a younger age or more often. Preventing infection Hepatitis B  If you have a higher risk for hepatitis B, you should be screened for this virus. You are considered at high risk for hepatitis B if: ? You were born in a country where hepatitis B is common. Ask your health care provider which countries are considered high risk. ? Your parents were born in a high-risk country, and you have not been immunized against hepatitis B (hepatitis B vaccine). ? You have HIV or AIDS. ? You use needles to inject street drugs. ? You live with someone who has hepatitis B. ? You have had sex with someone who has hepatitis B. ? You get hemodialysis treatment. ? You take certain medicines for conditions, including cancer, organ transplantation, and autoimmune conditions. Hepatitis C  Blood testing is recommended for: ? Everyone born from 1945 through 1965. ? Anyone with known risk factors for hepatitis C. Sexually transmitted infections (STIs)  You should be screened for sexually transmitted infections (STIs) including gonorrhea and chlamydia if: ? You are sexually active and are younger than 73 years of age. ? You are older than 73  years of age and your health care provider tells you that you are at risk for this type of infection. ? Your sexual activity has changed since you were last screened and you are at an increased risk for chlamydia or gonorrhea. Ask your health care provider if you are at risk.  If you do not have HIV, but are at risk, it may be recommended that you take a prescription medicine daily to prevent HIV infection. This is called pre-exposure prophylaxis (PrEP). You are considered at risk if: ? You are sexually active and do not   regularly use condoms or know the HIV status of your partner(s). ? You take drugs by injection. ? You are sexually active with a partner who has HIV. Talk with your health care provider about whether you are at high risk of being infected with HIV. If you choose to begin PrEP, you should first be tested for HIV. You should then be tested every 3 months for as long as you are taking PrEP. Pregnancy  If you are premenopausal and you may become pregnant, ask your health care provider about preconception counseling.  If you may become pregnant, take 400 to 800 micrograms (mcg) of folic acid every day.  If you want to prevent pregnancy, talk to your health care provider about birth control (contraception). Osteoporosis and menopause  Osteoporosis is a disease in which the bones lose minerals and strength with aging. This can result in serious bone fractures. Your risk for osteoporosis can be identified using a bone density scan.  If you are 65 years of age or older, or if you are at risk for osteoporosis and fractures, ask your health care provider if you should be screened.  Ask your health care provider whether you should take a calcium or vitamin D supplement to lower your risk for osteoporosis.  Menopause may have certain physical symptoms and risks.  Hormone replacement therapy may reduce some of these symptoms and risks. Talk to your health care provider about whether  hormone replacement therapy is right for you. Follow these instructions at home:  Schedule regular health, dental, and eye exams.  Stay current with your immunizations.  Do not use any tobacco products including cigarettes, chewing tobacco, or electronic cigarettes.  If you are pregnant, do not drink alcohol.  If you are breastfeeding, limit how much and how often you drink alcohol.  Limit alcohol intake to no more than 1 drink per day for nonpregnant women. One drink equals 12 ounces of beer, 5 ounces of wine, or 1 ounces of hard liquor.  Do not use street drugs.  Do not share needles.  Ask your health care provider for help if you need support or information about quitting drugs.  Tell your health care provider if you often feel depressed.  Tell your health care provider if you have ever been abused or do not feel safe at home. This information is not intended to replace advice given to you by your health care provider. Make sure you discuss any questions you have with your health care provider. Document Released: 05/23/2011 Document Revised: 04/14/2016 Document Reviewed: 08/11/2015 Elsevier Interactive Patient Education  2019 Elsevier Inc.  

## 2018-12-20 NOTE — Progress Notes (Signed)
Subjective:    Patient ID: Valerie Dragon, MD, female    DOB: 04-09-1946, 73 y.o.   MRN: [id number]  HPI She is here for a physical exam.   Last year she had left shoulder surgery for rotator cuff tear.  She recovered well and the shoulder is doing well.  She is now dealing with right shoulder pain and has rotator cuff tear as well and will likely need surgery this year.  Overall she feels fine and denies any other changes.  She does monitor her blood pressure at home and it typically is not elevated.  Medications and allergies reviewed with patient and updated if appropriate.  Patient Active Problem List   Diagnosis Date Noted  . Bicuspid aortic valve 03/19/2018  . Neck pain 12/20/2017  . Joint pain 11/26/2016  . Vitamin D deficiency 11/25/2016  . Hyperlipidemia 05/19/2016  . Dysthymia 03/10/2016  . Tendinopathy of left rotator cuff 03/10/2016  . Osteopenia   . Personal history of colonic polyps-1 cm hyperplastic splenic flexure 11/29/2012  . Atrial bigeminy   . Aortic insufficiency   . Abnormal CT scan, chest   . ADENOCARCINOMA, BREAST, ER POSITIVE 08/28/2009    Current Outpatient Medications on File Prior to Visit  Medication Sig Dispense Refill  . estradiol-norethindrone (ACTIVELLA) 1-0.5 MG tablet TAKE 1 TABLET BY MOUTH DAILY. 84 tablet 1   No current facility-administered medications on file prior to visit.     Past Medical History:  Diagnosis Date  . Abnormal CT scan, chest    Question of abnormal lymph nodes in the chest, biopsy was -2011, felt reactive adenopathy due to silicone implants (subsquently replaced)  . 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 Atrial bigeminy    Present at slow heart rate  . 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 CAD (coronary artery disease)    Cardiac CT,09/2010, calcium score 61, , small nidus LAD and RCA, and a below the knee  . Diastolic dysfunction    Stress echo, normal, November, 2012    . Ejection fraction    EF 60%, echo, November, 2012  . Osteopenia   . Personal history of colonic polyps-1 cm hyperplastic splenic flexure 11/29/2012  . Right ventricle    Question of mild right ventricular enlargement  in 2011 /   right ventricular size seems normal  in November, 2012, there is no documented pulmonary hypertension.    Past Surgical History:  Procedure Laterality Date  . BREAST RECONSTRUCTION  [id number]   implants exchanged  . BREAST SURGERY  1995   rt and lt total mastectomies-reconstruction  . COLONOSCOPY    . FACIAL COSMETIC SURGERY    . LYMPH NODE BIOPSY    . MOUTH SURGERY    . OOPHORECTOMY  1982   rt  . Varicose vein injections      Social History   Socioeconomic History  . Marital status: Married    Spouse name: Darnell Level  . Number of children: 3  . Years of education: 39  . Highest education level: Not on file  Occupational History  . Occupation: physician/GI    Employer: [employer]    Comment: [employer]  Social Needs  . Financial resource strain: Not on file  . Food insecurity:    Worry: Not on file    Inability: Not on file  . Transportation needs:    Medical: Not on file    Non-medical: Not  on file  Tobacco Use  . Smoking status: Never Smoker  . Smokeless tobacco: Never Used  Substance and Sexual Activity  . Alcohol use: Yes    Alcohol/week: 5.0 standard drinks    Types: 5 drink(s) per week    Comment: occ  . Drug use: No  . Sexual activity: Yes    Partners: Male    Birth control/protection: Post-menopausal  Lifestyle  . Physical activity:    Days per week: Not on file    Minutes per session: Not on file  . Stress: Not on file  Relationships  . Social connections:    Talks on phone: Not on file    Gets together: Not on file    Attends religious service: Not on file    Active member of club or organization: Not on file    Attends meetings of clubs or organizations: Not on file    Relationship status: Not on file  Other  Topics Concern  . Not on file  Social History Narrative   [employer], [place]. Married - '71- 7 years/divorced; Married '86. 1 son - '85; 2 step-children. 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 ACP/end of life - full resiscitation, reasonable duration mechanical ventilatory support, no heroic/futile measures. HCPOA - spouse    Family History  Problem Relation Age of Onset  . Prostate cancer Father   . Breast cancer Maternal Aunt 8  . Breast cancer Paternal Grandmother 53  . Colon cancer Paternal Grandfather        rectal  . Osteoarthritis Sister   . Aneurysm Brother 57       brain, no intervention    Review of Systems  Constitutional: Negative for chills, fatigue and fever.  Eyes: Negative for visual disturbance.  Respiratory: Negative for cough, shortness of breath and wheezing.   Cardiovascular: Negative for chest pain, palpitations and leg swelling.  Gastrointestinal: Negative for abdominal pain, blood in stool, constipation, diarrhea and nausea.       No gerd  Genitourinary: Negative for dysuria and hematuria.  Musculoskeletal: Positive for arthralgias.  Skin: Negative for color change and rash.  Neurological: Negative for dizziness, light-headedness and headaches.  Psychiatric/Behavioral: Negative for sleep disturbance. The patient is nervous/anxious.        Objective:   Vitals:   12/21/18 0822  BP: (!) 168/100  Pulse: (!) 52  Resp: 16  Temp: 98.7 F (37.1 C)  SpO2: 99%   Filed Weights   12/21/18 0822  Weight: 128 lb (58.1 kg)   Body mass index is 21.97 kg/m.  BP Readings from Last 3 Encounters:  12/21/18 (!) 168/100  12/20/17 138/78  03/20/17 122/62    Wt Readings from Last 3 Encounters:  12/21/18 128 lb (58.1 kg)  12/20/17 127 lb (57.6 kg)  03/20/17 125 lb (56.7 kg)     Physical Exam Constitutional: She appears well-developed and well-nourished. No distress.  HENT:  Head: Normocephalic and  atraumatic.  Right Ear: External ear normal. Normal ear canal and TM Left Ear: External ear normal.  Normal ear canal and TM Mouth/Throat: Oropharynx is clear and moist.  Eyes: Conjunctivae and EOM are normal.  Neck: Neck supple. No tracheal deviation present. No thyromegaly present.  No carotid bruit  Cardiovascular: Normal rate, regular rhythm and normal heart sounds.   No murmur heard.  No edema. Pulmonary/Chest: Effort normal and breath sounds normal. No respiratory distress. She has no wheezes. She has no rales.  Breast: deferred to  Gyn Abdominal: Soft. She exhibits no distension. There is no tenderness.  Lymphadenopathy: She has no cervical adenopathy.  Skin: Skin is warm and dry. She is not diaphoretic.  Psychiatric: She has a normal mood and affect. Her behavior is normal.        Assessment & Plan:   Physical exam: Screening blood work   ordered Immunizations   Had shingrix,   All up to date Colonoscopy    Up to date  Mammogram    N/A - s/p b/l mastectomy Gyn  - N/A  - no longer sees  Dexa   Up to date  Eye exams    Up to date  Exercise   Regular - weights, runs Weight   Normal BMI Skin   Sees Dr Ubaldo Glassing annually - no concerns Substance abuse    none  See Problem List for Assessment and Plan of chronic medical problems.    FU annually

## 2018-12-21 ENCOUNTER — Encounter: Payer: Self-pay | Admitting: Internal Medicine

## 2018-12-21 ENCOUNTER — Ambulatory Visit (INDEPENDENT_AMBULATORY_CARE_PROVIDER_SITE_OTHER): Payer: PPO | Admitting: Internal Medicine

## 2018-12-21 ENCOUNTER — Other Ambulatory Visit (INDEPENDENT_AMBULATORY_CARE_PROVIDER_SITE_OTHER): Payer: PPO

## 2018-12-21 VITALS — BP 168/100 | HR 52 | Temp 98.7°F | Resp 16 | Ht 64.0 in | Wt 128.0 lb

## 2018-12-21 DIAGNOSIS — Z0000 Encounter for general adult medical examination without abnormal findings: Secondary | ICD-10-CM

## 2018-12-21 DIAGNOSIS — E7849 Other hyperlipidemia: Secondary | ICD-10-CM | POA: Diagnosis not present

## 2018-12-21 DIAGNOSIS — E559 Vitamin D deficiency, unspecified: Secondary | ICD-10-CM | POA: Diagnosis not present

## 2018-12-21 DIAGNOSIS — M2550 Pain in unspecified joint: Secondary | ICD-10-CM

## 2018-12-21 DIAGNOSIS — F341 Dysthymic disorder: Secondary | ICD-10-CM

## 2018-12-21 DIAGNOSIS — M8580 Other specified disorders of bone density and structure, unspecified site: Secondary | ICD-10-CM

## 2018-12-21 DIAGNOSIS — I351 Nonrheumatic aortic (valve) insufficiency: Secondary | ICD-10-CM

## 2018-12-21 DIAGNOSIS — M542 Cervicalgia: Secondary | ICD-10-CM | POA: Diagnosis not present

## 2018-12-21 LAB — COMPREHENSIVE METABOLIC PANEL
ALT: 16 U/L (ref 0–35)
AST: 15 U/L (ref 0–37)
Albumin: 4.4 g/dL (ref 3.5–5.2)
Alkaline Phosphatase: 60 U/L (ref 39–117)
BUN: 22 mg/dL (ref 6–23)
CHLORIDE: 103 meq/L (ref 96–112)
CO2: 27 mEq/L (ref 19–32)
Calcium: 9.8 mg/dL (ref 8.4–10.5)
Creatinine, Ser: 0.85 mg/dL (ref 0.40–1.20)
GFR: 65.57 mL/min (ref >60.00)
Glucose, Bld: 98 mg/dL (ref 70–99)
Potassium: 4.6 mEq/L (ref 3.5–5.1)
Sodium: 138 mEq/L (ref 135–145)
Total Bilirubin: 0.6 mg/dL (ref 0.2–1.2)
Total Protein: 6.7 g/dL (ref 6.0–8.3)

## 2018-12-21 LAB — CBC WITH DIFFERENTIAL/PLATELET
BASOS PCT: 1.2 % (ref 0.0–3.0)
Basophils Absolute: 0.1 10*3/uL (ref 0.0–0.1)
EOS ABS: 0.3 10*3/uL (ref 0.0–0.7)
Eosinophils Relative: 5.3 % — ABNORMAL HIGH (ref 0.0–5.0)
HCT: 41.7 % (ref 36.0–46.0)
Hemoglobin: 13.8 g/dL (ref 12.0–15.0)
Lymphocytes Relative: 29.2 % (ref 12.0–46.0)
Lymphs Abs: 1.4 10*3/uL (ref 0.7–4.0)
MCHC: 33 g/dL (ref 30.0–36.0)
MCV: 91.3 fl (ref 78.0–100.0)
Monocytes Absolute: 0.5 10*3/uL (ref 0.1–1.0)
Monocytes Relative: 10.8 % (ref 3.0–12.0)
Neutro Abs: 2.6 10*3/uL (ref 1.4–7.7)
Neutrophils Relative %: 53.5 % (ref 43.0–77.0)
Platelets: 201 10*3/uL (ref 150.0–400.0)
RBC: 4.57 Mil/uL (ref 3.87–5.11)
RDW: 13.4 % (ref 11.5–15.5)
WBC: 4.8 10*3/uL (ref 4.0–10.5)

## 2018-12-21 LAB — LIPID PANEL
CHOL/HDL RATIO: 2
Cholesterol: 199 mg/dL (ref 0–200)
HDL: 81.8 mg/dL (ref >39.00)
LDL Cholesterol: 107 mg/dL — ABNORMAL HIGH (ref 0–99)
NonHDL: 117.67
Triglycerides: 55 mg/dL (ref 0.0–149.0)
VLDL: 11 mg/dL (ref 0.0–40.0)

## 2018-12-21 LAB — TSH: TSH: 3.02 u[IU]/mL (ref 0.35–4.50)

## 2018-12-21 LAB — VITAMIN D 25 HYDROXY (VIT D DEFICIENCY, FRACTURES): VITD: 42.55 ng/mL (ref 30.00–100.00)

## 2018-12-21 MED ORDER — ROSUVASTATIN CALCIUM 5 MG PO TABS: ORAL_TABLET | ORAL | 7 refills | Status: DC

## 2018-12-21 MED ORDER — DICLOFENAC SODIUM 75 MG PO TBEC: 75.0000 mg | DELAYED_RELEASE_TABLET | Freq: Two times a day (BID) | ORAL | 3 refills | Status: DC | PRN

## 2018-12-21 MED ORDER — ALPRAZOLAM 0.25 MG PO TABS: 0.2500 mg | ORAL_TABLET | Freq: Every evening | ORAL | 5 refills | Status: DC | PRN

## 2018-12-21 MED ORDER — BUPROPION HCL ER (XL) 150 MG PO TB24: 150.0000 mg | ORAL_TABLET | Freq: Every day | ORAL | 3 refills | Status: DC

## 2018-12-21 MED ORDER — TRAMADOL HCL 50 MG PO TABS: 50.0000 mg | ORAL_TABLET | Freq: Four times a day (QID) | ORAL | 0 refills | Status: DC | PRN

## 2018-12-21 MED FILL — traMADol HCL 50 MG TABS: 50 | 15 days supply | Qty: 60 | Fill #0

## 2018-12-21 MED FILL — ROSUVASTATIN CALCIUM 5 MG T: 5 | 84 days supply | Qty: 36 | Fill #0

## 2018-12-21 MED FILL — ALPRAZolam 0.25 MG TABS: 0.25 | 30 days supply | Qty: 30 | Fill #0

## 2018-12-21 NOTE — Assessment & Plan Note (Signed)
Well-controlled Mild dysthymia, mild anxiety Continue Wellbutrin at current dose

## 2018-12-21 NOTE — Assessment & Plan Note (Signed)
Chronic neck pain, low back pain Takes tramadol as needed, Xanax as needed for muscle relaxer Takes diclofenac Stable

## 2018-12-21 NOTE — Assessment & Plan Note (Signed)
Following with Dr. Meda Coffee Has echocardiogram in April of this year

## 2018-12-21 NOTE — Assessment & Plan Note (Signed)
Check vitamin D level given osteopenia

## 2018-12-21 NOTE — Assessment & Plan Note (Signed)
Controlled with lifestyle Lipid panel, CMP, TSH

## 2018-12-21 NOTE — Assessment & Plan Note (Signed)
Joint pain-right shoulder has been causing more pain recently and affecting her sleep Takes tramadol as needed, Xanax as needed for muscle relaxer Takes diclofenac Continue above

## 2018-12-21 NOTE — Assessment & Plan Note (Signed)
DEXA up-to-date Exercising regularly Will check vitamin D level

## 2018-12-22 ENCOUNTER — Encounter: Payer: Self-pay | Admitting: Internal Medicine

## 2018-12-25 ENCOUNTER — Encounter: Payer: Self-pay | Admitting: Internal Medicine

## 2018-12-26 DIAGNOSIS — M25511 Pain in right shoulder: Secondary | ICD-10-CM | POA: Diagnosis not present

## 2019-01-08 DIAGNOSIS — L821 Other seborrheic keratosis: Secondary | ICD-10-CM | POA: Diagnosis not present

## 2019-01-08 DIAGNOSIS — Z85828 Personal history of other malignant neoplasm of skin: Secondary | ICD-10-CM | POA: Diagnosis not present

## 2019-01-08 DIAGNOSIS — D2271 Melanocytic nevi of right lower limb, including hip: Secondary | ICD-10-CM | POA: Diagnosis not present

## 2019-01-08 DIAGNOSIS — D2272 Melanocytic nevi of left lower limb, including hip: Secondary | ICD-10-CM | POA: Diagnosis not present

## 2019-01-08 DIAGNOSIS — D1801 Hemangioma of skin and subcutaneous tissue: Secondary | ICD-10-CM | POA: Diagnosis not present

## 2019-01-08 DIAGNOSIS — L814 Other melanin hyperpigmentation: Secondary | ICD-10-CM | POA: Diagnosis not present

## 2019-01-09 DIAGNOSIS — M25511 Pain in right shoulder: Secondary | ICD-10-CM | POA: Diagnosis not present

## 2019-01-11 DIAGNOSIS — M7521 Bicipital tendinitis, right shoulder: Secondary | ICD-10-CM | POA: Diagnosis not present

## 2019-01-11 DIAGNOSIS — S46011A Strain of muscle(s) and tendon(s) of the rotator cuff of right shoulder, initial encounter: Secondary | ICD-10-CM | POA: Diagnosis not present

## 2019-01-11 DIAGNOSIS — M7541 Impingement syndrome of right shoulder: Secondary | ICD-10-CM | POA: Diagnosis not present

## 2019-01-11 DIAGNOSIS — X58XXXA Exposure to other specified factors, initial encounter: Secondary | ICD-10-CM | POA: Diagnosis not present

## 2019-01-11 DIAGNOSIS — S43491A Other sprain of right shoulder joint, initial encounter: Secondary | ICD-10-CM | POA: Diagnosis not present

## 2019-01-11 DIAGNOSIS — M75121 Complete rotator cuff tear or rupture of right shoulder, not specified as traumatic: Secondary | ICD-10-CM | POA: Diagnosis not present

## 2019-01-11 DIAGNOSIS — S43491D Other sprain of right shoulder joint, subsequent encounter: Secondary | ICD-10-CM | POA: Diagnosis not present

## 2019-01-11 DIAGNOSIS — M659 Synovitis and tenosynovitis, unspecified: Secondary | ICD-10-CM | POA: Diagnosis not present

## 2019-01-11 DIAGNOSIS — M19011 Primary osteoarthritis, right shoulder: Secondary | ICD-10-CM | POA: Diagnosis not present

## 2019-01-11 DIAGNOSIS — Y999 Unspecified external cause status: Secondary | ICD-10-CM | POA: Diagnosis not present

## 2019-01-11 DIAGNOSIS — G8918 Other acute postprocedural pain: Secondary | ICD-10-CM | POA: Diagnosis not present

## 2019-01-11 MED FILL — traMADol HCL 50 MG TABS: 50 | 5 days supply | Qty: 30 | Fill #0

## 2019-01-15 DIAGNOSIS — M25511 Pain in right shoulder: Secondary | ICD-10-CM | POA: Diagnosis not present

## 2019-01-15 DIAGNOSIS — Z4889 Encounter for other specified surgical aftercare: Secondary | ICD-10-CM | POA: Diagnosis not present

## 2019-01-15 DIAGNOSIS — M75121 Complete rotator cuff tear or rupture of right shoulder, not specified as traumatic: Secondary | ICD-10-CM | POA: Diagnosis not present

## 2019-01-15 DIAGNOSIS — I890 Lymphedema, not elsewhere classified: Secondary | ICD-10-CM | POA: Diagnosis not present

## 2019-01-21 DIAGNOSIS — M25511 Pain in right shoulder: Secondary | ICD-10-CM | POA: Diagnosis not present

## 2019-02-05 ENCOUNTER — Other Ambulatory Visit: Payer: Self-pay | Admitting: Internal Medicine

## 2019-02-05 MED FILL — ESTRADIOL-NORETHINDRONE ACE: 1-0.5 | 84 days supply | Qty: 84 | Fill #0

## 2019-02-08 MED FILL — buPROPion HCL ER (XL) 150 M: 150 | 90 days supply | Qty: 90 | Fill #0

## 2019-02-11 ENCOUNTER — Telehealth: Payer: Self-pay | Admitting: Cardiology

## 2019-02-11 ENCOUNTER — Other Ambulatory Visit (HOSPITAL_COMMUNITY): Payer: PPO

## 2019-02-11 NOTE — Telephone Encounter (Signed)
    Echocardiogram reschedule call- Dr. Meda Coffee- 02/11/2019- reason for echocardiogram-bicuspid aortic valve with moderate regurgitation  Left a voicemail on phone, attempted to call house as well.  No answer.  Echocardiogram appears to be a routine 1 year follow-up for bicuspid aortic valve.  At this time given the coronavirus situation, we would like to postpone this echocardiogram future date, greater than 12 weeks seems reasonable as long as no symptoms are present.  Candee Furbish, MD

## 2019-02-15 DIAGNOSIS — M25511 Pain in right shoulder: Secondary | ICD-10-CM | POA: Diagnosis not present

## 2019-02-15 NOTE — Telephone Encounter (Signed)
Pts echo is rescheduled for 03/12/19.  Pt made aware of appt date and time by Echo scheduler.

## 2019-03-08 ENCOUNTER — Telehealth: Payer: Self-pay | Admitting: Internal Medicine

## 2019-03-08 NOTE — Telephone Encounter (Signed)
Contacted patient   She is feeling OK Echo had been rescheduled due to corona virus pandemic   Given continued recomm to minimize pt exposure would reschedule for early July 2020

## 2019-03-12 ENCOUNTER — Other Ambulatory Visit (HOSPITAL_COMMUNITY): Payer: PPO

## 2019-03-12 MED FILL — ROSUVASTATIN CALCIUM 5 MG T: 5 | 84 days supply | Qty: 36 | Fill #0

## 2019-03-25 DIAGNOSIS — M25511 Pain in right shoulder: Secondary | ICD-10-CM | POA: Diagnosis not present

## 2019-04-13 MED FILL — TRETINOIN 0.05 % CREA: 0.05 | 30 days supply | Qty: 20 | Fill #0

## 2019-04-17 DIAGNOSIS — M25511 Pain in right shoulder: Secondary | ICD-10-CM | POA: Diagnosis not present

## 2019-04-22 DIAGNOSIS — M25511 Pain in right shoulder: Secondary | ICD-10-CM | POA: Diagnosis not present

## 2019-04-26 DIAGNOSIS — M25511 Pain in right shoulder: Secondary | ICD-10-CM | POA: Diagnosis not present

## 2019-04-26 DIAGNOSIS — Z4789 Encounter for other orthopedic aftercare: Secondary | ICD-10-CM | POA: Diagnosis not present

## 2019-04-26 DIAGNOSIS — Z9889 Other specified postprocedural states: Secondary | ICD-10-CM | POA: Diagnosis not present

## 2019-04-29 DIAGNOSIS — M25511 Pain in right shoulder: Secondary | ICD-10-CM | POA: Diagnosis not present

## 2019-05-14 DIAGNOSIS — M25511 Pain in right shoulder: Secondary | ICD-10-CM | POA: Diagnosis not present

## 2019-05-14 MED FILL — buPROPion HCL ER (XL) 150 M: 150 | 90 days supply | Qty: 90 | Fill #1

## 2019-05-30 DIAGNOSIS — M25511 Pain in right shoulder: Secondary | ICD-10-CM | POA: Diagnosis not present

## 2019-05-31 ENCOUNTER — Other Ambulatory Visit: Payer: Self-pay

## 2019-05-31 ENCOUNTER — Ambulatory Visit (HOSPITAL_COMMUNITY)
Admission: RE | Admit: 2019-05-31 | Discharge: 2019-05-31 | Disposition: A | Discharge status: Home / Self Care | Payer: PPO | Source: Ambulatory Visit | Attending: Cardiology | Admitting: Cardiology

## 2019-05-31 DIAGNOSIS — Q231 Congenital insufficiency of aortic valve: Secondary | ICD-10-CM | POA: Diagnosis not present

## 2019-05-31 DIAGNOSIS — I351 Nonrheumatic aortic (valve) insufficiency: Secondary | ICD-10-CM

## 2019-05-31 DIAGNOSIS — E785 Hyperlipidemia, unspecified: Secondary | ICD-10-CM | POA: Insufficient documentation

## 2019-05-31 NOTE — Progress Notes (Signed)
  Echocardiogram 2D Echocardiogram has been performed.  Valerie Phelps 05/31/2019, 3:02 PM

## 2019-06-05 ENCOUNTER — Telehealth: Payer: Self-pay | Admitting: *Deleted

## 2019-06-05 DIAGNOSIS — E7849 Other hyperlipidemia: Secondary | ICD-10-CM

## 2019-06-05 DIAGNOSIS — I351 Nonrheumatic aortic (valve) insufficiency: Secondary | ICD-10-CM

## 2019-06-05 DIAGNOSIS — E559 Vitamin D deficiency, unspecified: Secondary | ICD-10-CM

## 2019-06-05 DIAGNOSIS — Q231 Congenital insufficiency of aortic valve: Secondary | ICD-10-CM

## 2019-06-05 MED FILL — ROSUVASTATIN CALCIUM 5 MG T: 5 | 84 days supply | Qty: 36 | Fill #0

## 2019-06-05 NOTE — Telephone Encounter (Signed)
Pts echo with lab same day is scheduled for one year out on 06/04/2020.  Pt made aware of appt date and times by [employer].

## 2019-06-05 NOTE — Telephone Encounter (Signed)
-----   Message from Dorothy Spark, MD sent at 06/04/2019  8:50 PM EDT ----- Please schedule her to see me in a year with an echo prior to that visit, dg - bicuspid aortic valve and aortic regurgitation, also labs - CBC, CMP, lipids, TSH, vitamin D

## 2019-06-05 NOTE — Telephone Encounter (Signed)
Orders placed for this pt to have an echo done in one year, with labs same day, with a one year follow-up/recall to see Dr Meda Coffee thereafter.  Will send a message to [facility]/ECHO scheduler to call this pt and go ahead and schedule her echo out for one year, as well as her labs same day. Will recall her one year follow-up appt with Dr Meda Coffee.

## 2019-06-06 DIAGNOSIS — H5213 Myopia, bilateral: Secondary | ICD-10-CM | POA: Diagnosis not present

## 2019-06-06 DIAGNOSIS — H40013 Open angle with borderline findings, low risk, bilateral: Secondary | ICD-10-CM | POA: Diagnosis not present

## 2019-06-06 DIAGNOSIS — H40053 Ocular hypertension, bilateral: Secondary | ICD-10-CM | POA: Diagnosis not present

## 2019-06-06 DIAGNOSIS — H52223 Regular astigmatism, bilateral: Secondary | ICD-10-CM | POA: Diagnosis not present

## 2019-06-06 DIAGNOSIS — H524 Presbyopia: Secondary | ICD-10-CM | POA: Diagnosis not present

## 2019-07-11 MED FILL — ESTRADIOL-NORETHINDRONE ACE: 1-0.5 | 84 days supply | Qty: 84 | Fill #1

## 2019-07-18 ENCOUNTER — Other Ambulatory Visit: Payer: Self-pay | Admitting: Internal Medicine

## 2019-07-18 DIAGNOSIS — Z13820 Encounter for screening for osteoporosis: Secondary | ICD-10-CM

## 2019-07-18 DIAGNOSIS — M8589 Other specified disorders of bone density and structure, multiple sites: Secondary | ICD-10-CM

## 2019-07-20 ENCOUNTER — Ambulatory Visit (INDEPENDENT_AMBULATORY_CARE_PROVIDER_SITE_OTHER): Payer: PPO

## 2019-07-20 ENCOUNTER — Other Ambulatory Visit: Payer: Self-pay

## 2019-07-20 DIAGNOSIS — Z23 Encounter for immunization: Secondary | ICD-10-CM | POA: Diagnosis not present

## 2019-07-30 MED FILL — DICLOFENAC SODIUM 75 MG TAB: 75 | 90 days supply | Qty: 180 | Fill #0

## 2019-08-06 MED FILL — buPROPion HCL ER (XL) 150 M: 150 | 90 days supply | Qty: 90 | Fill #2

## 2019-08-08 ENCOUNTER — Other Ambulatory Visit: Payer: PPO

## 2019-08-14 ENCOUNTER — Ambulatory Visit (INDEPENDENT_AMBULATORY_CARE_PROVIDER_SITE_OTHER)
Admission: RE | Admit: 2019-08-14 | Discharge: 2019-08-14 | Disposition: A | Discharge status: Home / Self Care | Payer: PPO | Source: Ambulatory Visit | Attending: Internal Medicine | Admitting: Internal Medicine

## 2019-08-14 ENCOUNTER — Other Ambulatory Visit: Payer: Self-pay

## 2019-08-14 DIAGNOSIS — Z13820 Encounter for screening for osteoporosis: Secondary | ICD-10-CM

## 2019-08-14 DIAGNOSIS — M8589 Other specified disorders of bone density and structure, multiple sites: Secondary | ICD-10-CM

## 2019-08-18 ENCOUNTER — Encounter: Payer: Self-pay | Admitting: Internal Medicine

## 2019-08-23 MED FILL — ROSUVASTATIN CALCIUM 5 MG T: 5 | 84 days supply | Qty: 36 | Fill #1

## 2019-11-06 MED FILL — buPROPion HCL ER (XL) 150 M: 150 | 90 days supply | Qty: 90 | Fill #3

## 2019-11-18 MED FILL — ROSUVASTATIN CALCIUM 5 MG T: 5 | 84 days supply | Qty: 36 | Fill #2

## 2019-12-05 DIAGNOSIS — H5213 Myopia, bilateral: Secondary | ICD-10-CM | POA: Diagnosis not present

## 2019-12-05 DIAGNOSIS — H524 Presbyopia: Secondary | ICD-10-CM | POA: Diagnosis not present

## 2019-12-05 DIAGNOSIS — H40059 Ocular hypertension, unspecified eye: Secondary | ICD-10-CM | POA: Diagnosis not present

## 2019-12-05 DIAGNOSIS — H40013 Open angle with borderline findings, low risk, bilateral: Secondary | ICD-10-CM | POA: Diagnosis not present

## 2019-12-05 DIAGNOSIS — H52223 Regular astigmatism, bilateral: Secondary | ICD-10-CM | POA: Diagnosis not present

## 2019-12-05 DIAGNOSIS — H40019 Open angle with borderline findings, low risk, unspecified eye: Secondary | ICD-10-CM | POA: Diagnosis not present

## 2019-12-16 MED FILL — ESTRADIOL-NORETHINDRONE ACE: 1-0.5 | 84 days supply | Qty: 84 | Fill #2

## 2020-01-14 ENCOUNTER — Telehealth: Payer: Self-pay | Admitting: Internal Medicine

## 2020-01-14 NOTE — Progress Notes (Signed)
  Chronic Care Management   Outreach Note  01/14/2020 Name: Valerie DAVITT, MD MRN: [id number] DOB: 24-Sep-1946  Referred by: [name], MD Reason for referral : No chief complaint on file.   An unsuccessful telephone outreach was attempted today. The patient was referred to the pharmacist for assistance with care management and care coordination.   Follow Up Plan:   Raynicia Dukes [employer] Scheduler

## 2020-01-17 DIAGNOSIS — L821 Other seborrheic keratosis: Secondary | ICD-10-CM | POA: Diagnosis not present

## 2020-01-17 DIAGNOSIS — Z85828 Personal history of other malignant neoplasm of skin: Secondary | ICD-10-CM | POA: Diagnosis not present

## 2020-01-17 DIAGNOSIS — L814 Other melanin hyperpigmentation: Secondary | ICD-10-CM | POA: Diagnosis not present

## 2020-01-17 DIAGNOSIS — D225 Melanocytic nevi of trunk: Secondary | ICD-10-CM | POA: Diagnosis not present

## 2020-01-17 DIAGNOSIS — D1801 Hemangioma of skin and subcutaneous tissue: Secondary | ICD-10-CM | POA: Diagnosis not present

## 2020-01-17 DIAGNOSIS — D2272 Melanocytic nevi of left lower limb, including hip: Secondary | ICD-10-CM | POA: Diagnosis not present

## 2020-01-17 DIAGNOSIS — D2271 Melanocytic nevi of right lower limb, including hip: Secondary | ICD-10-CM | POA: Diagnosis not present

## 2020-01-21 DIAGNOSIS — M542 Cervicalgia: Secondary | ICD-10-CM | POA: Diagnosis not present

## 2020-01-28 DIAGNOSIS — M542 Cervicalgia: Secondary | ICD-10-CM | POA: Diagnosis not present

## 2020-02-04 DIAGNOSIS — M542 Cervicalgia: Secondary | ICD-10-CM | POA: Diagnosis not present

## 2020-02-11 ENCOUNTER — Other Ambulatory Visit: Payer: Self-pay | Admitting: Internal Medicine

## 2020-02-12 ENCOUNTER — Other Ambulatory Visit: Payer: Self-pay | Admitting: Internal Medicine

## 2020-02-12 DIAGNOSIS — Z85828 Personal history of other malignant neoplasm of skin: Secondary | ICD-10-CM | POA: Diagnosis not present

## 2020-02-12 DIAGNOSIS — L570 Actinic keratosis: Secondary | ICD-10-CM | POA: Diagnosis not present

## 2020-02-13 ENCOUNTER — Encounter: Payer: Self-pay | Admitting: Internal Medicine

## 2020-02-13 ENCOUNTER — Other Ambulatory Visit: Payer: Self-pay

## 2020-02-13 MED ORDER — BUPROPION HCL ER (XL) 150 MG PO TB24: 150.0000 mg | ORAL_TABLET | Freq: Every day | ORAL | 0 refills | Status: DC

## 2020-02-13 MED ORDER — ROSUVASTATIN CALCIUM 5 MG PO TABS: ORAL_TABLET | ORAL | 0 refills | Status: DC

## 2020-02-13 MED ORDER — DICLOFENAC SODIUM 75 MG PO TBEC: 75.0000 mg | DELAYED_RELEASE_TABLET | Freq: Two times a day (BID) | ORAL | 0 refills | Status: DC | PRN

## 2020-02-13 MED FILL — buPROPion HCL ER (XL) 150 M: 150 | 90 days supply | Qty: 90 | Fill #0

## 2020-02-13 MED FILL — DICLOFENAC SOD EC 75 MG TAB: 75 | 90 days supply | Qty: 180 | Fill #0

## 2020-02-13 MED FILL — ROSUVASTATIN CALCIUM 5 MG T: 5 | 84 days supply | Qty: 36 | Fill #0

## 2020-02-14 DIAGNOSIS — M542 Cervicalgia: Secondary | ICD-10-CM | POA: Diagnosis not present

## 2020-02-18 ENCOUNTER — Encounter: Payer: Self-pay | Admitting: Internal Medicine

## 2020-02-19 NOTE — Progress Notes (Signed)
Subjective:    Patient ID: Valerie Dragon, MD, female    DOB: [birth year], 74 y.o.   MRN: [id number]  HPI She is here for a physical exam.   She does have some issues with her sleep, but most of it is related to getting older.  Overall she thinks she gets enough sleep.  If she is tired during the day she will take a nap.  She is still dealing with her shoulder pain.  The pain is tolerable and she is able to do most things.  She is still very active and exercises regularly.  She does worry at times, but feels that is within normal limits of aging.  She takes her medications as prescribed and is happy with all of her medications.  Medications and allergies reviewed with patient and updated if appropriate.  Patient Active Problem List   Diagnosis Date Noted  . Bicuspid aortic valve 03/19/2018  . Neck pain 12/20/2017  . Joint pain 11/26/2016  . Vitamin D deficiency 11/25/2016  . Hyperlipidemia 05/19/2016  . Dysthymia 03/10/2016  . Tendinopathy of left rotator cuff 03/10/2016  . Osteopenia   . Personal history of colonic polyps-1 cm hyperplastic splenic flexure 11/29/2012  . Atrial bigeminy   . Aortic insufficiency   . Abnormal CT scan, chest   . ADENOCARCINOMA, BREAST, ER POSITIVE 08/28/2009    Current Outpatient Medications on File Prior to Visit  Medication Sig Dispense Refill  . ALPRAZolam (XANAX) 0.25 MG tablet Take 1 tablet (0.25 mg total) by mouth at bedtime as needed for anxiety. 30 tablet 5  . buPROPion (WELLBUTRIN XL) 150 MG 24 hr tablet Take 1 tablet (150 mg total) by mouth daily. 90 tablet 0  . diclofenac (VOLTAREN) 75 MG EC tablet Take 1 tablet (75 mg total) by mouth 2 (two) times daily as needed for mild pain. 180 tablet 0  . estradiol-norethindrone (ACTIVELLA) 1-0.5 MG tablet TAKE 1 TABLET BY MOUTH DAILY. 84 tablet 3  . rosuvastatin (CRESTOR) 5 MG tablet TAKE 1 TABLET BY MOUTH 3 TIMES A WEEK. 54 tablet 0  . traMADol (ULTRAM) 50 MG tablet Take 1 tablet (50 mg  total) by mouth every 6 (six) hours as needed. 60 tablet 0   No current facility-administered medications on file prior to visit.    Past Medical History:  Diagnosis Date  . Abnormal CT scan, chest    Question of abnormal lymph nodes in the chest, biopsy was -2011, felt reactive adenopathy due to silicone implants (subsquently replaced)  . 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 Atrial bigeminy    Present at slow heart rate  . 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 CAD (coronary artery disease)    Cardiac CT,09/2010, calcium score 61, , small nidus LAD and RCA, and a below the knee  . Diastolic dysfunction    Stress echo, normal, November, 2012  . Ejection fraction    EF 60%, echo, November, 2012  . Osteopenia   . Personal history of colonic polyps-1 cm hyperplastic splenic flexure 11/29/2012  . Right ventricle    Question of mild right ventricular enlargement  in 2011 /   right ventricular size seems normal  in November, 2012, there is no documented pulmonary hypertension.    Past Surgical History:  Procedure Laterality Date  . BREAST RECONSTRUCTION  [id number]   implants exchanged  . BREAST SURGERY  1995   rt and lt total mastectomies-reconstruction  .  COLONOSCOPY    . FACIAL COSMETIC SURGERY    . LYMPH NODE BIOPSY    . MOUTH SURGERY    . OOPHORECTOMY  1982   rt  . Varicose vein injections      Social History   Socioeconomic History  . Marital status: Married    Spouse name: Darnell Level  . Number of children: 3  . Years of education: 79  . Highest education level: Not on file  Occupational History  . Occupation: physician/GI    Employer: [employer]    Comment: [employer]  Tobacco Use  . Smoking status: Never Smoker  . Smokeless tobacco: Never Used  Substance and Sexual Activity  . Alcohol use: Yes    Alcohol/week: 5.0 standard drinks    Types: 5 drink(s) per week    Comment: occ  . Drug use: No  . Sexual activity: Yes     Partners: Male    Birth control/protection: Post-menopausal  Other Topics Concern  . Not on file  Social History Narrative   [employer], [place]. Married - '71- 7 years/divorced; Married '86. 1 son - '85; 2 step-children. 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 ACP/end of life - full resiscitation, reasonable duration mechanical ventilatory support, no heroic/futile measures. HCPOA - spouse   Social Determinants of Radio broadcast assistant Strain:   . Difficulty of Paying Living Expenses:   Food Insecurity:   . Worried About Charity fundraiser in the Last Year:   . Arboriculturist in the Last Year:   Transportation Needs:   . Film/video editor (Medical):   Marland Kitchen Lack of Transportation (Non-Medical):   Physical Activity:   . Days of Exercise per Week:   . Minutes of Exercise per Session:   Stress:   . Feeling of Stress :   Social Connections:   . Frequency of Communication with Friends and Family:   . Frequency of Social Gatherings with Friends and Family:   . Attends Religious Services:   . Active Member of Clubs or Organizations:   . Attends Archivist Meetings:   Marland Kitchen Marital Status:     Family History  Problem Relation Age of Onset  . Prostate cancer Father   . Breast cancer Maternal Aunt 44  . Breast cancer Paternal Grandmother 69  . Colon cancer Paternal Grandfather        rectal  . Osteoarthritis Sister   . Aneurysm Brother 59       brain, no intervention    Review of Systems  Constitutional: Negative for fever.  Eyes: Negative for visual disturbance.  Respiratory: Negative for cough, shortness of breath and wheezing.   Cardiovascular: Positive for palpitations. Negative for chest pain and leg swelling.  Gastrointestinal: Negative for abdominal pain, blood in stool, constipation, diarrhea and nausea.       No gerd  Genitourinary: Negative for dysuria and hematuria.  Musculoskeletal: Positive for  arthralgias.  Skin: Negative for color change and rash.       Objective:   Vitals:   02/20/20 0802  BP: (!) 146/90  Pulse: (!) 53  Resp: 16  Temp: (!) 97.5 F (36.4 C)  SpO2: 99%   Filed Weights   02/20/20 0802  Weight: 125 lb 12.8 oz (57.1 kg)   Body mass index is 21.59 kg/m.  BP Readings from Last 3 Encounters:  02/20/20 (!) 146/90  12/21/18 (!) 168/100  12/20/17 138/78  Wt Readings from Last 3 Encounters:  02/20/20 125 lb 12.8 oz (57.1 kg)  12/21/18 128 lb (58.1 kg)  12/20/17 127 lb (57.6 kg)     Physical Exam Constitutional: She appears well-developed and well-nourished. No distress.  HENT:  Head: Normocephalic and atraumatic.  Right Ear: External ear normal. Normal ear canal and TM Left Ear: External ear normal.  Normal ear canal and TM Mouth/Throat: Oropharynx is clear and moist.  Eyes: Conjunctivae and EOM are normal.  Neck: Neck supple. No tracheal deviation present. No thyromegaly present.  No carotid bruit  Cardiovascular: Normal rate, regular rhythm and normal heart sounds.   1/6 diastolic murmur heard.  No edema. Pulmonary/Chest: Effort normal and breath sounds normal. No respiratory distress. She has no wheezes. She has no rales.  Breast: deferred   Abdominal: Soft. She exhibits no distension. There is no tenderness.  Lymphadenopathy: She has no cervical adenopathy.  Skin: Skin is warm and dry. She is not diaphoretic.  Psychiatric: She has a normal mood and affect. Her behavior is normal.        Assessment & Plan:   Physical exam: Screening blood work    ordered Immunizations   All up to date Colonoscopy   Up to date Mammogram   N/a    S/p b/l mastectomy Gyn   - n/a  Dexa  Up to date  Eye exams   Up to date  Exercise  Regular - runs, weights Weight  Normal BMI Substance abuse   None Sees Dr Ubaldo Glassing annually  See Problem List for Assessment and Plan of chronic medical problems.      This visit occurred during the SARS-CoV-2  public health emergency.  Safety protocols were in place, including screening questions prior to the visit, additional usage of staff PPE, and extensive cleaning of exam room while observing appropriate contact time as indicated for disinfecting solutions.

## 2020-02-19 NOTE — Patient Instructions (Addendum)

## 2020-02-20 ENCOUNTER — Other Ambulatory Visit: Payer: Self-pay

## 2020-02-20 ENCOUNTER — Ambulatory Visit (INDEPENDENT_AMBULATORY_CARE_PROVIDER_SITE_OTHER): Payer: PPO | Admitting: Internal Medicine

## 2020-02-20 ENCOUNTER — Other Ambulatory Visit: Payer: Self-pay | Admitting: Internal Medicine

## 2020-02-20 ENCOUNTER — Encounter: Payer: Self-pay | Admitting: Internal Medicine

## 2020-02-20 VITALS — BP 146/90 | HR 53 | Temp 97.5°F | Resp 16 | Ht 64.0 in | Wt 125.8 lb

## 2020-02-20 DIAGNOSIS — F341 Dysthymic disorder: Secondary | ICD-10-CM

## 2020-02-20 DIAGNOSIS — I351 Nonrheumatic aortic (valve) insufficiency: Secondary | ICD-10-CM | POA: Diagnosis not present

## 2020-02-20 DIAGNOSIS — Z0000 Encounter for general adult medical examination without abnormal findings: Secondary | ICD-10-CM

## 2020-02-20 DIAGNOSIS — E7849 Other hyperlipidemia: Secondary | ICD-10-CM

## 2020-02-20 DIAGNOSIS — M8589 Other specified disorders of bone density and structure, multiple sites: Secondary | ICD-10-CM

## 2020-02-20 DIAGNOSIS — M2550 Pain in unspecified joint: Secondary | ICD-10-CM

## 2020-02-20 DIAGNOSIS — E559 Vitamin D deficiency, unspecified: Secondary | ICD-10-CM | POA: Diagnosis not present

## 2020-02-20 DIAGNOSIS — Q231 Congenital insufficiency of aortic valve: Secondary | ICD-10-CM | POA: Diagnosis not present

## 2020-02-20 LAB — COMPREHENSIVE METABOLIC PANEL
ALT: 16 U/L (ref 0–35)
AST: 18 U/L (ref 0–37)
Albumin: 4.5 g/dL (ref 3.5–5.2)
Alkaline Phosphatase: 65 U/L (ref 39–117)
BUN: 24 mg/dL — ABNORMAL HIGH (ref 6–23)
CO2: 28 mEq/L (ref 19–32)
Calcium: 9.5 mg/dL (ref 8.4–10.5)
Chloride: 105 mEq/L (ref 96–112)
Creatinine, Ser: 0.79 mg/dL (ref 0.40–1.20)
GFR: 71.12 mL/min (ref >60.00)
Glucose, Bld: 101 mg/dL — ABNORMAL HIGH (ref 70–99)
Potassium: 4.6 mEq/L (ref 3.5–5.1)
Sodium: 138 mEq/L (ref 135–145)
Total Bilirubin: 0.6 mg/dL (ref 0.2–1.2)
Total Protein: 6.8 g/dL (ref 6.0–8.3)

## 2020-02-20 LAB — CBC WITH DIFFERENTIAL/PLATELET
Basophils Absolute: 0 10*3/uL (ref 0.0–0.1)
Basophils Relative: 1.1 % (ref 0.0–3.0)
Eosinophils Absolute: 0.2 10*3/uL (ref 0.0–0.7)
Eosinophils Relative: 3.8 % (ref 0.0–5.0)
HCT: 41.4 % (ref 36.0–46.0)
Hemoglobin: 13.8 g/dL (ref 12.0–15.0)
Lymphocytes Relative: 27 % (ref 12.0–46.0)
Lymphs Abs: 1.2 10*3/uL (ref 0.7–4.0)
MCHC: 33.4 g/dL (ref 30.0–36.0)
MCV: 91.1 fl (ref 78.0–100.0)
Monocytes Absolute: 0.5 10*3/uL (ref 0.1–1.0)
Monocytes Relative: 10.6 % (ref 3.0–12.0)
Neutro Abs: 2.6 10*3/uL (ref 1.4–7.7)
Neutrophils Relative %: 57.5 % (ref 43.0–77.0)
Platelets: 208 10*3/uL (ref 150.0–400.0)
RBC: 4.54 Mil/uL (ref 3.87–5.11)
RDW: 13.5 % (ref 11.5–15.5)
WBC: 4.5 10*3/uL (ref 4.0–10.5)

## 2020-02-20 LAB — LIPID PANEL
Cholesterol: 198 mg/dL (ref 0–200)
HDL: 80.6 mg/dL (ref >39.00)
LDL Cholesterol: 105 mg/dL — ABNORMAL HIGH (ref 0–99)
NonHDL: 116.94
Total CHOL/HDL Ratio: 2
Triglycerides: 58 mg/dL (ref 0.0–149.0)
VLDL: 11.6 mg/dL (ref 0.0–40.0)

## 2020-02-20 LAB — TSH: TSH: 3.06 u[IU]/mL (ref 0.35–4.50)

## 2020-02-20 LAB — VITAMIN D 25 HYDROXY (VIT D DEFICIENCY, FRACTURES): VITD: 40.63 ng/mL (ref 30.00–100.00)

## 2020-02-20 LAB — SEDIMENTATION RATE: Sed Rate: 4 mm/hr (ref 0–30)

## 2020-02-20 MED ORDER — DICLOFENAC SODIUM 75 MG PO TBEC: 75.0000 mg | DELAYED_RELEASE_TABLET | Freq: Two times a day (BID) | ORAL | 3 refills | Status: DC | PRN

## 2020-02-20 MED ORDER — ROSUVASTATIN CALCIUM 5 MG PO TABS: ORAL_TABLET | ORAL | 3 refills | Status: DC

## 2020-02-20 MED ORDER — ESTRADIOL-NORETHINDRONE ACET 1-0.5 MG PO TABS: 1.0000 | ORAL_TABLET | Freq: Every day | ORAL | 3 refills | Status: DC

## 2020-02-20 MED ORDER — TRAMADOL HCL 50 MG PO TABS: 50.0000 mg | ORAL_TABLET | Freq: Four times a day (QID) | ORAL | 3 refills | Status: DC | PRN

## 2020-02-20 MED ORDER — BUPROPION HCL ER (XL) 150 MG PO TB24: 150.0000 mg | ORAL_TABLET | Freq: Every day | ORAL | 3 refills | Status: DC

## 2020-02-20 MED ORDER — ALPRAZOLAM 0.25 MG PO TABS: 0.2500 mg | ORAL_TABLET | Freq: Every evening | ORAL | 5 refills | Status: DC | PRN

## 2020-02-20 MED FILL — ALPRAZolam 0.25 MG TABS: 0.25 | 30 days supply | Qty: 30 | Fill #0

## 2020-02-20 MED FILL — traMADol HCL 50 MG TABS: 50 | 7 days supply | Qty: 28 | Fill #0

## 2020-02-20 NOTE — Assessment & Plan Note (Signed)
Chronic Check lipid panel  Continue daily statin Regular exercise and healthy diet encouraged  

## 2020-02-20 NOTE — Assessment & Plan Note (Signed)
Following with Dr. Meda Coffee

## 2020-02-20 NOTE — Assessment & Plan Note (Signed)
Chronic Controlled with current dose of bupropion-continue Exercises regularly

## 2020-02-20 NOTE — Assessment & Plan Note (Signed)
Monitored by Dr. Meda Coffee Asymptomatic Has echocardiogram annually

## 2020-02-20 NOTE — Assessment & Plan Note (Signed)
Taking vitamin D daily Will check level

## 2020-02-20 NOTE — Assessment & Plan Note (Signed)
Chronic Related to arthritis and rotator cuff issues in the shoulders We will check ESR

## 2020-02-20 NOTE — Assessment & Plan Note (Signed)
Chronic DEXA up-to-date Exercising regularly Taking vitamin D-we will check level Discussed calcium

## 2020-02-22 ENCOUNTER — Encounter: Payer: Self-pay | Admitting: Internal Medicine

## 2020-03-13 ENCOUNTER — Telehealth: Payer: Self-pay | Admitting: Internal Medicine

## 2020-03-13 NOTE — Progress Notes (Signed)
  Chronic Care Management   Outreach Note  03/13/2020 Name: Valerie FRAZZINI, MD MRN: [id number] DOB: 11/07/1946  Referred by: [name], MD Reason for referral : No chief complaint on file.   A second unsuccessful telephone outreach was attempted today. The patient was referred to pharmacist for assistance with care management and care coordination.  This note is not being shared with the patient for the following reason: To respect privacy (The patient or proxy has requested that the information not be shared).   Follow Up Plan:   Raynicia Dukes [employer] Scheduler

## 2020-05-01 ENCOUNTER — Telehealth: Payer: Self-pay | Admitting: Internal Medicine

## 2020-05-01 NOTE — Progress Notes (Signed)
  Chronic Care Management   Outreach Note  05/01/2020 Name: GEORGIANNE GRITZ, MD MRN: [id number] DOB: 1946-04-06  Referred by: [name], MD Reason for referral : No chief complaint on file.   An unsuccessful telephone outreach was attempted today. The patient was referred to the pharmacist for assistance with care management and care coordination.   This note is not being shared with the patient for the following reason: To respect privacy (The patient or proxy has requested that the information not be shared).  Follow Up Plan:   Earney [place] [facility] Scheduler

## 2020-05-07 MED FILL — ROSUVASTATIN CALCIUM 5 MG T: 5 | 42 days supply | Qty: 18 | Fill #1

## 2020-05-07 MED FILL — buPROPion HCL ER (XL) 150 M: 150 | 90 days supply | Qty: 90 | Fill #0

## 2020-05-11 MED FILL — ESTRADIOL-NORETHINDRONE ACE: 1-0.5 | 84 days supply | Qty: 84 | Fill #0

## 2020-05-25 MED FILL — DICLOFENAC SOD EC 75 MG TAB: 75 | 90 days supply | Qty: 180 | Fill #0

## 2020-06-04 ENCOUNTER — Ambulatory Visit (HOSPITAL_COMMUNITY): Payer: PPO | Attending: Internal Medicine

## 2020-06-04 ENCOUNTER — Other Ambulatory Visit: Payer: Self-pay

## 2020-06-04 ENCOUNTER — Other Ambulatory Visit: Payer: PPO

## 2020-06-04 DIAGNOSIS — I351 Nonrheumatic aortic (valve) insufficiency: Secondary | ICD-10-CM | POA: Insufficient documentation

## 2020-06-04 DIAGNOSIS — E7849 Other hyperlipidemia: Secondary | ICD-10-CM | POA: Diagnosis not present

## 2020-06-04 DIAGNOSIS — E559 Vitamin D deficiency, unspecified: Secondary | ICD-10-CM | POA: Diagnosis not present

## 2020-06-04 DIAGNOSIS — Q231 Congenital insufficiency of aortic valve: Secondary | ICD-10-CM | POA: Insufficient documentation

## 2020-06-04 LAB — ECHOCARDIOGRAM COMPLETE
AR max vel: 1.48 cm2
AV Area VTI: 1.86 cm2
AV Area mean vel: 1.77 cm2
AV Mean grad: 7 mmHg
AV Peak grad: 16.3 mmHg
Ao pk vel: 2.02 m/s
Area-P 1/2: 2.26 cm2
P 1/2 time: 854 msec
S' Lateral: 3.1 cm

## 2020-06-05 ENCOUNTER — Other Ambulatory Visit: Payer: PPO | Admitting: *Deleted

## 2020-06-05 ENCOUNTER — Other Ambulatory Visit: Payer: Self-pay | Admitting: *Deleted

## 2020-06-05 DIAGNOSIS — Q231 Congenital insufficiency of aortic valve: Secondary | ICD-10-CM

## 2020-06-05 DIAGNOSIS — I2510 Atherosclerotic heart disease of native coronary artery without angina pectoris: Secondary | ICD-10-CM

## 2020-06-05 DIAGNOSIS — I2584 Coronary atherosclerosis due to calcified coronary lesion: Secondary | ICD-10-CM | POA: Diagnosis not present

## 2020-06-05 DIAGNOSIS — R7989 Other specified abnormal findings of blood chemistry: Secondary | ICD-10-CM | POA: Diagnosis not present

## 2020-06-05 DIAGNOSIS — I351 Nonrheumatic aortic (valve) insufficiency: Secondary | ICD-10-CM | POA: Diagnosis not present

## 2020-06-05 DIAGNOSIS — E785 Hyperlipidemia, unspecified: Secondary | ICD-10-CM

## 2020-06-05 DIAGNOSIS — E559 Vitamin D deficiency, unspecified: Secondary | ICD-10-CM | POA: Diagnosis not present

## 2020-06-05 DIAGNOSIS — E7849 Other hyperlipidemia: Secondary | ICD-10-CM | POA: Diagnosis not present

## 2020-06-05 DIAGNOSIS — R7301 Impaired fasting glucose: Secondary | ICD-10-CM | POA: Diagnosis not present

## 2020-06-05 DIAGNOSIS — I498 Other specified cardiac arrhythmias: Secondary | ICD-10-CM | POA: Diagnosis not present

## 2020-06-06 LAB — LIPID PANEL
Chol/HDL Ratio: 2.1 ratio (ref 0.0–4.4)
Cholesterol, Total: 204 mg/dL — ABNORMAL HIGH (ref 100–199)
HDL: 98 mg/dL (ref >39)
LDL Chol Calc (NIH): 91 mg/dL (ref 0–99)
Triglycerides: 85 mg/dL (ref 0–149)
VLDL Cholesterol Cal: 15 mg/dL (ref 5–40)

## 2020-06-06 LAB — CBC WITH DIFFERENTIAL/PLATELET
Basophils Absolute: 0 10*3/uL (ref 0.0–0.2)
Basos: 1 %
EOS (ABSOLUTE): 0.2 10*3/uL (ref 0.0–0.4)
Eos: 4 %
Hematocrit: 42 % (ref 34.0–46.6)
Hemoglobin: 13.7 g/dL (ref 11.1–15.9)
Immature Grans (Abs): 0 10*3/uL (ref 0.0–0.1)
Immature Granulocytes: 0 %
Lymphocytes Absolute: 1.6 10*3/uL (ref 0.7–3.1)
Lymphs: 32 %
MCH: 29.6 pg (ref 26.6–33.0)
MCHC: 32.6 g/dL (ref 31.5–35.7)
MCV: 91 fL (ref 79–97)
Monocytes Absolute: 0.4 10*3/uL (ref 0.1–0.9)
Monocytes: 8 %
Neutrophils Absolute: 2.8 10*3/uL (ref 1.4–7.0)
Neutrophils: 55 %
Platelets: 243 10*3/uL (ref 150–450)
RBC: 4.63 x10E6/uL (ref 3.77–5.28)
RDW: 12 % (ref 11.7–15.4)
WBC: 5.1 10*3/uL (ref 3.4–10.8)

## 2020-06-06 LAB — COMPREHENSIVE METABOLIC PANEL
ALT: 16 IU/L (ref 0–32)
AST: 21 IU/L (ref 0–40)
Albumin/Globulin Ratio: 2 (ref 1.2–2.2)
Albumin: 4.5 g/dL (ref 3.7–4.7)
Alkaline Phosphatase: 76 IU/L (ref 48–121)
BUN/Creatinine Ratio: 19 (ref 12–28)
BUN: 19 mg/dL (ref 8–27)
Bilirubin Total: 0.3 mg/dL (ref 0.0–1.2)
CO2: 23 mmol/L (ref 20–29)
Calcium: 9.4 mg/dL (ref 8.7–10.3)
Chloride: 102 mmol/L (ref 96–106)
Creatinine, Ser: 1.01 mg/dL — ABNORMAL HIGH (ref 0.57–1.00)
GFR calc Af Amer: 63 mL/min/{1.73_m2} (ref >59)
GFR calc non Af Amer: 55 mL/min/{1.73_m2} — ABNORMAL LOW (ref >59)
Globulin, Total: 2.3 g/dL (ref 1.5–4.5)
Glucose: 109 mg/dL — ABNORMAL HIGH (ref 65–99)
Potassium: 4.3 mmol/L (ref 3.5–5.2)
Sodium: 140 mmol/L (ref 134–144)
Total Protein: 6.8 g/dL (ref 6.0–8.5)

## 2020-06-06 LAB — TSH: TSH: 5.41 u[IU]/mL — ABNORMAL HIGH (ref 0.450–4.500)

## 2020-06-06 LAB — VITAMIN D 25 HYDROXY (VIT D DEFICIENCY, FRACTURES): Vit D, 25-Hydroxy: 29.1 ng/mL — ABNORMAL LOW (ref 30.0–100.0)

## 2020-06-08 ENCOUNTER — Telehealth: Payer: Self-pay | Admitting: *Deleted

## 2020-06-08 DIAGNOSIS — Q231 Congenital insufficiency of aortic valve: Secondary | ICD-10-CM

## 2020-06-08 DIAGNOSIS — R7301 Impaired fasting glucose: Secondary | ICD-10-CM

## 2020-06-08 DIAGNOSIS — I2584 Coronary atherosclerosis due to calcified coronary lesion: Secondary | ICD-10-CM

## 2020-06-08 DIAGNOSIS — I2510 Atherosclerotic heart disease of native coronary artery without angina pectoris: Secondary | ICD-10-CM

## 2020-06-08 DIAGNOSIS — E785 Hyperlipidemia, unspecified: Secondary | ICD-10-CM

## 2020-06-08 DIAGNOSIS — I351 Nonrheumatic aortic (valve) insufficiency: Secondary | ICD-10-CM

## 2020-06-08 MED ORDER — VITAMIN D-3 125 MCG (5000 UT) PO TABS: 5000.0000 [IU] | ORAL_TABLET | Freq: Every day | ORAL | Status: DC

## 2020-06-08 NOTE — Telephone Encounter (Signed)
-----   Message from Dorothy Spark, MD sent at 06/08/2020  9:15 AM EDT ----- Normal CBC, borderline Crea, increase hydration, normal liver function, borderline TSH, we will send fT3,4, normal lipids, I will decide about statin dose based on calcium scoring results, low vitamin D, start taking vitamin D 5000 IU daily

## 2020-06-08 NOTE — Telephone Encounter (Signed)
Thank you :)

## 2020-06-08 NOTE — Telephone Encounter (Signed)
-----   Message from Dorothy Spark, MD sent at 06/05/2020  1:05 PM EDT ----- Crecencio Mc!  I have talked to Dr Delfin Edis about her echo results, she will need a repeat echo and follow up visit in 1 year: diagnosis is aortic insufficiency, bicuspid aortic valve  She will need a calcium score now - sometimes next week: diagnosis is HLP and coronary calcifications.   Thank you,  [name]

## 2020-06-08 NOTE — Telephone Encounter (Signed)
Spoke with the pt and informed her of lab results and recommendations per Dr. Meda Coffee. Sent off for Free T3/T4 to [facility] this morning for the pt.  Those results should be available tomorrow or Wednesday.  Pt aware of this.  Pt states she will increase her Vitamin D OTC to 5,000 Units per day. Pt states she will be on the listen out for dosing of statin, based on her Calcium score to be done on 7/26.  Pt is asking if Dr. Meda Coffee would order for her to have a hemoglobin A1C done on 7/26, same day as she comes in for her Calcium score.  Pt is concerned that being her fasting BS has been borderline elevated over the years, she is asking maybe if she should get her A1C checked for this.  Order placed for her to come in for lab to have this drawn, same day as she comes for Calcium Score on 7/26.  Informed the pt that I will make Dr. Meda Coffee aware of this.  Pt verbalized understanding and agrees with this plan.

## 2020-06-08 NOTE — Telephone Encounter (Signed)
-----   Message from Dorothy Spark, MD sent at 06/05/2020  1:01 PM EDT ----- Valerie Phelps!  I have talked to Dr Delfin Edis about her echo results, she will need a repeat echo and follow up visit in 1 year: diagnosis is aortic insufficiency, bicuspid aortic valve  She will need a calcium score now - sometimes next week: diagnosis is HLP and coronary calcifications.   Thank you,  [name]

## 2020-06-08 NOTE — Telephone Encounter (Signed)
Order for repeat echo already placed for one year out.  Message to echo scheduler Edmon Crape, to call the pt back and arrange this for one year out.  Pts Calcium Score is scheduled for 06/15/20 at 1130.  Pt made aware of this appt date and time by CT Scheduler.  One year recall placed for the pt to come in and see Dr. Meda Coffee as an OV at that time.

## 2020-06-08 NOTE — Telephone Encounter (Signed)
Order for repeat echo placed for one year out per Dr. Meda Coffee. Will send a staff message to our Echo Scheduler to call and arrange this study for one year out per Dr. Meda Coffee, being the pt has already been made aware of this plan.

## 2020-06-11 LAB — SPECIMEN STATUS REPORT

## 2020-06-11 LAB — T3, FREE: T3, Free: 2.9 pg/mL (ref 2.0–4.4)

## 2020-06-11 LAB — T4, FREE: Free T4: 1.18 ng/dL (ref 0.82–1.77)

## 2020-06-15 ENCOUNTER — Other Ambulatory Visit: Payer: PPO | Admitting: *Deleted

## 2020-06-15 ENCOUNTER — Ambulatory Visit (INDEPENDENT_AMBULATORY_CARE_PROVIDER_SITE_OTHER)
Admission: RE | Admit: 2020-06-15 | Discharge: 2020-06-15 | Disposition: A | Discharge status: Home / Self Care | Payer: Self-pay | Source: Ambulatory Visit | Attending: Cardiology | Admitting: Cardiology

## 2020-06-15 ENCOUNTER — Other Ambulatory Visit: Payer: Self-pay

## 2020-06-15 DIAGNOSIS — I2510 Atherosclerotic heart disease of native coronary artery without angina pectoris: Secondary | ICD-10-CM

## 2020-06-15 DIAGNOSIS — R7301 Impaired fasting glucose: Secondary | ICD-10-CM

## 2020-06-15 DIAGNOSIS — E785 Hyperlipidemia, unspecified: Secondary | ICD-10-CM

## 2020-06-15 DIAGNOSIS — I2584 Coronary atherosclerosis due to calcified coronary lesion: Secondary | ICD-10-CM

## 2020-06-16 LAB — HEMOGLOBIN A1C
Est. average glucose Bld gHb Est-mCnc: 114 mg/dL
Hgb A1c MFr Bld: 5.6 % (ref 4.8–5.6)

## 2020-06-17 MED FILL — ROSUVASTATIN CALCIUM 5 MG T: 5 | 88 days supply | Qty: 38 | Fill #0

## 2020-06-18 ENCOUNTER — Telehealth: Payer: Self-pay | Admitting: *Deleted

## 2020-06-18 DIAGNOSIS — I2510 Atherosclerotic heart disease of native coronary artery without angina pectoris: Secondary | ICD-10-CM

## 2020-06-18 DIAGNOSIS — E785 Hyperlipidemia, unspecified: Secondary | ICD-10-CM

## 2020-06-18 DIAGNOSIS — E7849 Other hyperlipidemia: Secondary | ICD-10-CM

## 2020-06-18 DIAGNOSIS — I700 Atherosclerosis of aorta: Secondary | ICD-10-CM

## 2020-06-18 DIAGNOSIS — I351 Nonrheumatic aortic (valve) insufficiency: Secondary | ICD-10-CM

## 2020-06-18 DIAGNOSIS — I2584 Coronary atherosclerosis due to calcified coronary lesion: Secondary | ICD-10-CM

## 2020-06-18 MED ORDER — ROSUVASTATIN CALCIUM 5 MG PO TABS: 5.0000 mg | ORAL_TABLET | Freq: Every day | ORAL | 2 refills | Status: DC

## 2020-06-18 NOTE — Telephone Encounter (Signed)
Patient is requesting to speak with Ivy. She states she has additional questions regarding recommendations based on her lab results.

## 2020-06-18 NOTE — Telephone Encounter (Signed)
Spoke with the pt and informed her of her Calcium Score and recommendations per Dr. Radford Pax, covering for Dr. Meda Coffee. Informed the pt that its recommended that she increase her rosuvastatin to 5 mg po daily, and come in for repeat labs in 6 weeks, to check lipids and ALT.  Confirmed the pharmacy of choice. Scheduled the pt for labs in 6 weeks on 07/30/20.  She is aware to come fasting.  Pt verbalized understanding and agrees with this plan. Will forward this result to pts PCP.

## 2020-06-18 NOTE — Telephone Encounter (Signed)
-----   Message from Sueanne Margarita, MD sent at 06/17/2020 11:57 AM EDT ----- Coronary Ca score elevated at 257 and is 74th % for her age and female sex and has increased since her study in 2011 (score 61 at that time).  This is considered high and recommend aggressive treatment of her cholesterol with LDL goal < 70.  LDL was 91 earlier this month.  Increase Crestor to 5mg  daily (was on 5mg  3 times weekly) and repeat FLP and ALT in 6 weeks.

## 2020-07-16 ENCOUNTER — Encounter: Payer: Self-pay | Admitting: Internal Medicine

## 2020-07-24 ENCOUNTER — Encounter: Payer: Self-pay | Admitting: Internal Medicine

## 2020-07-28 ENCOUNTER — Telehealth: Payer: Self-pay | Admitting: *Deleted

## 2020-07-28 DIAGNOSIS — E785 Hyperlipidemia, unspecified: Secondary | ICD-10-CM

## 2020-07-28 DIAGNOSIS — R7301 Impaired fasting glucose: Secondary | ICD-10-CM

## 2020-07-28 DIAGNOSIS — I2510 Atherosclerotic heart disease of native coronary artery without angina pectoris: Secondary | ICD-10-CM

## 2020-07-28 DIAGNOSIS — I2584 Coronary atherosclerosis due to calcified coronary lesion: Secondary | ICD-10-CM

## 2020-07-28 NOTE — Telephone Encounter (Signed)
Per Dr. Meda Coffee, this pt was already scheduled to come in our office for lab on 07/30/20 to check lipids and ALT.  Per Dr. Meda Coffee, she wants the pt to have a CMET, lipids, and hemoglobin A1C instead. Cancelled the ALT and ordered CMET and A1C onto her lab appt.

## 2020-07-29 ENCOUNTER — Encounter: Payer: Self-pay | Admitting: Internal Medicine

## 2020-07-29 DIAGNOSIS — E785 Hyperlipidemia, unspecified: Secondary | ICD-10-CM

## 2020-07-29 DIAGNOSIS — E7849 Other hyperlipidemia: Secondary | ICD-10-CM

## 2020-07-29 DIAGNOSIS — I351 Nonrheumatic aortic (valve) insufficiency: Secondary | ICD-10-CM

## 2020-07-29 DIAGNOSIS — I2510 Atherosclerotic heart disease of native coronary artery without angina pectoris: Secondary | ICD-10-CM

## 2020-07-29 DIAGNOSIS — I700 Atherosclerosis of aorta: Secondary | ICD-10-CM

## 2020-07-29 DIAGNOSIS — I2584 Coronary atherosclerosis due to calcified coronary lesion: Secondary | ICD-10-CM

## 2020-07-29 MED ORDER — ROSUVASTATIN CALCIUM 5 MG PO TABS: 5.0000 mg | ORAL_TABLET | Freq: Every day | ORAL | 3 refills | Status: DC

## 2020-07-30 ENCOUNTER — Other Ambulatory Visit: Payer: PPO | Admitting: *Deleted

## 2020-07-30 ENCOUNTER — Other Ambulatory Visit: Payer: Self-pay

## 2020-07-30 DIAGNOSIS — I2510 Atherosclerotic heart disease of native coronary artery without angina pectoris: Secondary | ICD-10-CM | POA: Diagnosis not present

## 2020-07-30 DIAGNOSIS — I351 Nonrheumatic aortic (valve) insufficiency: Secondary | ICD-10-CM

## 2020-07-30 DIAGNOSIS — I700 Atherosclerosis of aorta: Secondary | ICD-10-CM | POA: Diagnosis not present

## 2020-07-30 DIAGNOSIS — I2584 Coronary atherosclerosis due to calcified coronary lesion: Secondary | ICD-10-CM | POA: Diagnosis not present

## 2020-07-30 DIAGNOSIS — E7849 Other hyperlipidemia: Secondary | ICD-10-CM

## 2020-07-30 DIAGNOSIS — R7301 Impaired fasting glucose: Secondary | ICD-10-CM

## 2020-07-30 DIAGNOSIS — E785 Hyperlipidemia, unspecified: Secondary | ICD-10-CM

## 2020-07-31 ENCOUNTER — Other Ambulatory Visit: Payer: Self-pay | Admitting: Internal Medicine

## 2020-07-31 ENCOUNTER — Other Ambulatory Visit: Payer: Self-pay

## 2020-07-31 ENCOUNTER — Encounter: Payer: Self-pay | Admitting: Internal Medicine

## 2020-07-31 DIAGNOSIS — I2584 Coronary atherosclerosis due to calcified coronary lesion: Secondary | ICD-10-CM

## 2020-07-31 DIAGNOSIS — I700 Atherosclerosis of aorta: Secondary | ICD-10-CM

## 2020-07-31 DIAGNOSIS — I351 Nonrheumatic aortic (valve) insufficiency: Secondary | ICD-10-CM

## 2020-07-31 DIAGNOSIS — I2510 Atherosclerotic heart disease of native coronary artery without angina pectoris: Secondary | ICD-10-CM

## 2020-07-31 DIAGNOSIS — E7849 Other hyperlipidemia: Secondary | ICD-10-CM

## 2020-07-31 DIAGNOSIS — E785 Hyperlipidemia, unspecified: Secondary | ICD-10-CM

## 2020-07-31 LAB — COMPREHENSIVE METABOLIC PANEL
ALT: 13 IU/L (ref 0–32)
AST: 20 IU/L (ref 0–40)
Albumin/Globulin Ratio: 2 (ref 1.2–2.2)
Albumin: 4.5 g/dL (ref 3.7–4.7)
Alkaline Phosphatase: 76 IU/L (ref 48–121)
BUN/Creatinine Ratio: 23 (ref 12–28)
BUN: 21 mg/dL (ref 8–27)
Bilirubin Total: 0.4 mg/dL (ref 0.0–1.2)
CO2: 22 mmol/L (ref 20–29)
Calcium: 10 mg/dL (ref 8.7–10.3)
Chloride: 103 mmol/L (ref 96–106)
Creatinine, Ser: 0.93 mg/dL (ref 0.57–1.00)
GFR calc Af Amer: 70 mL/min/{1.73_m2} (ref >59)
GFR calc non Af Amer: 61 mL/min/{1.73_m2} (ref >59)
Globulin, Total: 2.3 g/dL (ref 1.5–4.5)
Glucose: 97 mg/dL (ref 65–99)
Potassium: 4.8 mmol/L (ref 3.5–5.2)
Sodium: 141 mmol/L (ref 134–144)
Total Protein: 6.8 g/dL (ref 6.0–8.5)

## 2020-07-31 LAB — LIPID PANEL
Chol/HDL Ratio: 2.2 ratio (ref 0.0–4.4)
Cholesterol, Total: 193 mg/dL (ref 100–199)
HDL: 86 mg/dL (ref >39)
LDL Chol Calc (NIH): 93 mg/dL (ref 0–99)
Triglycerides: 77 mg/dL (ref 0–149)
VLDL Cholesterol Cal: 14 mg/dL (ref 5–40)

## 2020-07-31 LAB — HEMOGLOBIN A1C
Est. average glucose Bld gHb Est-mCnc: 111 mg/dL
Hgb A1c MFr Bld: 5.5 % (ref 4.8–5.6)

## 2020-07-31 MED ORDER — ROSUVASTATIN CALCIUM 5 MG PO TABS: 5.0000 mg | ORAL_TABLET | Freq: Every day | ORAL | 3 refills | Status: DC

## 2020-07-31 MED FILL — ROSUVASTATIN CALCIUM 5 MG T: 5 | 90 days supply | Qty: 90 | Fill #0

## 2020-08-12 ENCOUNTER — Encounter: Payer: Self-pay | Admitting: Internal Medicine

## 2020-08-17 ENCOUNTER — Other Ambulatory Visit: Payer: Self-pay

## 2020-08-17 ENCOUNTER — Ambulatory Visit (INDEPENDENT_AMBULATORY_CARE_PROVIDER_SITE_OTHER): Payer: PPO

## 2020-08-17 VITALS — Ht 64.0 in

## 2020-08-17 DIAGNOSIS — Z23 Encounter for immunization: Secondary | ICD-10-CM | POA: Diagnosis not present

## 2020-08-26 DIAGNOSIS — Z20822 Contact with and (suspected) exposure to covid-19: Secondary | ICD-10-CM | POA: Diagnosis not present

## 2020-09-14 MED FILL — ESTRADIOL-NORETHINDRONE ACE: 1-0.5 | 84 days supply | Qty: 84 | Fill #1

## 2020-10-27 MED FILL — ROSUVASTATIN CALCIUM 5 MG T: 5 | 90 days supply | Qty: 90 | Fill #1

## 2021-01-27 MED FILL — ROSUVASTATIN CALCIUM 5 MG T: 5 | 90 days supply | Qty: 90 | Fill #2

## 2021-02-15 ENCOUNTER — Encounter: Payer: Self-pay | Admitting: Internal Medicine

## 2021-02-20 ENCOUNTER — Other Ambulatory Visit (HOSPITAL_COMMUNITY): Payer: Self-pay

## 2021-02-20 ENCOUNTER — Other Ambulatory Visit: Payer: Self-pay | Admitting: Internal Medicine

## 2021-02-21 DIAGNOSIS — R7303 Prediabetes: Secondary | ICD-10-CM | POA: Insufficient documentation

## 2021-02-21 DIAGNOSIS — R739 Hyperglycemia, unspecified: Secondary | ICD-10-CM | POA: Insufficient documentation

## 2021-02-21 NOTE — Progress Notes (Signed)
Subjective:    Patient ID: Valerie Dragon, MD, female    DOB: 1946/08/17, 75 y.o.   MRN: [id number]   This visit occurred during the SARS-CoV-2 public health emergency.  Safety protocols were in place, including screening questions prior to the visit, additional usage of staff PPE, and extensive cleaning of exam room while observing appropriate contact time as indicated for disinfecting solutions.    HPI She is here for a physical exam.    She has been experiencing some neck issues and she wonders if it is related to posture and her chronic shoulder issues.  She has been thinking about going to physical therapy to see if that helps.   Medications and allergies reviewed with patient and updated if appropriate.  Patient Active Problem List   Diagnosis Date Noted  . Hyperglycemia 02/21/2021  . Bicuspid aortic valve 03/19/2018  . Neck pain 12/20/2017  . Joint pain 11/26/2016  . Vitamin D deficiency 11/25/2016  . Hyperlipidemia 05/19/2016  . Osteopenia   . Personal history of colonic polyps-1 cm hyperplastic splenic flexure 11/29/2012  . Atrial bigeminy   . Aortic insufficiency   . Abnormal CT scan, chest   . ADENOCARCINOMA, BREAST, ER POSITIVE 08/28/2009    Current Outpatient Medications on File Prior to Visit  Medication Sig Dispense Refill  . ALPRAZolam (XANAX) 0.25 MG tablet Take 1 tablet (0.25 mg total) by mouth at bedtime as needed for anxiety. 30 tablet 5  . Cholecalciferol (VITAMIN D-3) 125 MCG (5000 UT) TABS Take 5,000 Units by mouth daily. 30 tablet   . diclofenac (VOLTAREN) 75 MG EC tablet Take 1 tablet (75 mg total) by mouth 2 (two) times daily as needed for mild pain. 180 tablet 3  . rosuvastatin (CRESTOR) 5 MG tablet TAKE 1 TABLET (5 MG TOTAL) BY MOUTH DAILY. 90 tablet 3  . traMADol (ULTRAM) 50 MG tablet Take 1 tablet (50 mg total) by mouth every 6 (six) hours as needed. 60 tablet 3  . estradiol-norethindrone (ACTIVELLA) 1-0.5 MG tablet TAKE 1 TABLET BY MOUTH  DAILY. 84 tablet 3   No current facility-administered medications on file prior to visit.    Past Medical History:  Diagnosis Date  . Abnormal CT scan, chest    Question of abnormal lymph nodes in the chest, biopsy was -2011, felt reactive adenopathy due to silicone implants (subsquently replaced)  . 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 Atrial bigeminy    Present at slow heart rate  . 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 CAD (coronary artery disease)    Cardiac CT,09/2010, calcium score 61, , small nidus LAD and RCA, and a below the knee  . Diastolic dysfunction    Stress echo, normal, November, 2012  . Ejection fraction    EF 60%, echo, November, 2012  . Osteopenia   . Personal history of colonic polyps-1 cm hyperplastic splenic flexure 11/29/2012  . Right ventricle    Question of mild right ventricular enlargement  in 2011 /   right ventricular size seems normal  in November, 2012, there is no documented pulmonary hypertension.    Past Surgical History:  Procedure Laterality Date  . BREAST RECONSTRUCTION  [id number]   implants exchanged  . BREAST SURGERY  1995   rt and lt total mastectomies-reconstruction  . COLONOSCOPY    . FACIAL COSMETIC SURGERY    . LYMPH NODE BIOPSY    . MOUTH SURGERY    .  OOPHORECTOMY  1982   rt  . Varicose vein injections      Social History   Socioeconomic History  . Marital status: Married    Spouse name: Darnell Level  . Number of children: 3  . Years of education: 32  . Highest education level: Not on file  Occupational History  . Occupation: physician/GI    Employer: [employer]    Comment: [employer]  Tobacco Use  . Smoking status: Never Smoker  . Smokeless tobacco: Never Used  Substance and Sexual Activity  . Alcohol use: Yes    Alcohol/week: 5.0 standard drinks    Types: 5 drink(s) per week    Comment: occ  . Drug use: No  . Sexual activity: Yes    Partners: Male    Birth  control/protection: Post-menopausal  Other Topics Concern  . Not on file  Social History Narrative   [employer], [place]. Married - '71- 7 years/divorced; Married '86. 1 son - '85; 2 step-children. 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 ACP/end of life - full resiscitation, reasonable duration mechanical ventilatory support, no heroic/futile measures. HCPOA - spouse   Social Determinants of Radio broadcast assistant Strain: Not on file  Food Insecurity: Not on file  Transportation Needs: Not on file  Physical Activity: Not on file  Stress: Not on file  Social Connections: Not on file    Family History  Problem Relation Age of Onset  . Prostate cancer Father   . Breast cancer Maternal Aunt 23  . Breast cancer Paternal Grandmother 65  . Colon cancer Paternal Grandfather        rectal  . Osteoarthritis Sister   . Aneurysm Brother 35       brain, no intervention    Review of Systems  Constitutional: Negative for chills and fever.  Eyes: Negative for visual disturbance.  Respiratory: Negative for cough, shortness of breath and wheezing.   Cardiovascular: Positive for palpitations (occ - bigeminy). Negative for chest pain and leg swelling.  Gastrointestinal: Negative for abdominal pain, blood in stool, constipation, diarrhea and nausea.       No gerd  Genitourinary: Negative for dysuria and hematuria.  Musculoskeletal: Positive for arthralgias and neck pain.  Skin: Negative for rash.  Neurological: Positive for headaches (from neck). Negative for light-headedness.  Psychiatric/Behavioral: Positive for dysphoric mood. The patient is nervous/anxious.        Objective:   Vitals:   02/22/21 0829  BP: 130/78  Pulse: 66  Temp: 98 F (36.7 C)  SpO2: 98%   Filed Weights   02/22/21 0829  Weight: 127 lb (57.6 kg)   Body mass index is 21.8 kg/m.  BP Readings from Last 3 Encounters:  02/22/21 130/78  02/20/20 (!)  146/90  12/21/18 (!) 168/100    Wt Readings from Last 3 Encounters:  02/22/21 127 lb (57.6 kg)  02/20/20 125 lb 12.8 oz (57.1 kg)  12/21/18 128 lb (58.1 kg)     Depression screen [facility] 2/9 02/22/2021 02/20/2020 12/21/2018 03/10/2016 07/16/2015  Decreased Interest 0 0 0 0 0  Down, Depressed, Hopeless 0 0 0 0 0  PHQ - 2 Score 0 0 0 0 0  Altered sleeping 0 - 0 - -  Tired, decreased energy 0 - 0 - -  Change in appetite 0 - 0 - -  Feeling bad or failure about yourself  0 - 0 - -  Trouble concentrating 0 - 0 - -  Moving slowly or fidgety/restless 0 - 0 - -  Suicidal thoughts 0 - 0 - -  PHQ-9 Score 0 - 0 - -  Difficult doing work/chores Not difficult at all - - - -    GAD 7 : Generalized Anxiety Score 02/22/2021  Nervous, Anxious, on Edge 1  Control/stop worrying 1  Worry too much - different things 1  Trouble relaxing 0  Restless 0  Easily annoyed or irritable 1  Afraid - awful might happen 0  Total GAD 7 Score 4  Anxiety Difficulty Somewhat difficult       Physical Exam Constitutional: She appears well-developed and well-nourished. No distress.  HENT:  Head: Normocephalic and atraumatic.  Right Ear: External ear normal. Normal ear canal and TM Left Ear: External ear normal.  Normal ear canal and TM Mouth/Throat: Oropharynx is clear and moist.  Eyes: Conjunctivae and EOM are normal.  Neck: Neck supple. No tracheal deviation present. No thyromegaly present.  No carotid bruit  Cardiovascular: Normal rate, regular rhythm and normal heart sounds.   2/6 diastolic murmur heard.  No edema. Pulmonary/Chest: Effort normal and breath sounds normal. No respiratory distress. She has no wheezes. She has no rales.  Breast: deferred   Abdominal: Soft. She exhibits no distension. There is no tenderness. Musculoskeletal: Bilateral trapezius tenderness from base of skull to shoulders Lymphadenopathy: She has no cervical adenopathy.  Skin: Skin is warm and dry. She is not diaphoretic.   Psychiatric: She has a normal mood and affect. Her behavior is normal.        Assessment & Plan:   Physical exam: Screening blood work    ordered Immunizations  tdap due 4/22, others up to date, discussed covid booster #2 Colonoscopy   Up to date  Mammogram  N/a - s/p b/l mastectomy Gyn  n/a Dexa   Up to date - due 9/22 at [place] exams   Up to date  Exercise  Regular - run, weights Weight   normal Substance abuse    none Sees Dr Ubaldo Glassing      See Problem List for Assessment and Plan of chronic medical problems.

## 2021-02-21 NOTE — Patient Instructions (Addendum)
Blood work was ordered.     Medications changes include :   lexapro 5 mg daily  Your prescription(s) have been submitted to your pharmacy. Please take as directed and contact our office if you believe you are having problem(s) with the medication(s).    Please followup in 1 year     Health Maintenance, Female Adopting a healthy lifestyle and getting preventive care are important in promoting health and wellness. Ask your health care provider about:  The right schedule for you to have regular tests and exams.  Things you can do on your own to prevent diseases and keep yourself healthy. What should I know about diet, weight, and exercise? Eat a healthy diet  Eat a diet that includes plenty of vegetables, fruits, low-fat dairy products, and lean protein.  Do not eat a lot of foods that are high in solid fats, added sugars, or sodium.   Maintain a healthy weight Body mass index (BMI) is used to identify weight problems. It estimates body fat based on height and weight. Your health care provider can help determine your BMI and help you achieve or maintain a healthy weight. Get regular exercise Get regular exercise. This is one of the most important things you can do for your health. Most adults should:  Exercise for at least 150 minutes each week. The exercise should increase your heart rate and make you sweat (moderate-intensity exercise).  Do strengthening exercises at least twice a week. This is in addition to the moderate-intensity exercise.  Spend less time sitting. Even light physical activity can be beneficial. Watch cholesterol and blood lipids Have your blood tested for lipids and cholesterol at 75 years of age, then have this test every 5 years. Have your cholesterol levels checked more often if:  Your lipid or cholesterol levels are high.  You are older than 75 years of age.  You are at high risk for heart disease. What should I know about cancer  screening? Depending on your health history and family history, you may need to have cancer screening at various ages. This may include screening for:  Breast cancer.  Cervical cancer.  Colorectal cancer.  Skin cancer.  Lung cancer. What should I know about heart disease, diabetes, and high blood pressure? Blood pressure and heart disease  High blood pressure causes heart disease and increases the risk of stroke. This is more likely to develop in people who have high blood pressure readings, are of African descent, or are overweight.  Have your blood pressure checked: ? Every 3-5 years if you are 78-30 years of age. ? Every year if you are 75 years old or older. Diabetes Have regular diabetes screenings. This checks your fasting blood sugar level. Have the screening done:  Once every three years after age 75 if you are at a normal weight and have a low risk for diabetes.  More often and at a younger age if you are overweight or have a high risk for diabetes. What should I know about preventing infection? Hepatitis B If you have a higher risk for hepatitis B, you should be screened for this virus. Talk with your health care provider to find out if you are at risk for hepatitis B infection. Hepatitis C Testing is recommended for:  Everyone born from [birth year] through 1965.  Anyone with known risk factors for hepatitis C. Sexually transmitted infections (STIs)  Get screened for STIs, including gonorrhea and chlamydia, if: ? You are sexually active and are younger  than 76 years of age. ? You are older than 75 years of age and your health care provider tells you that you are at risk for this type of infection. ? Your sexual activity has changed since you were last screened, and you are at increased risk for chlamydia or gonorrhea. Ask your health care provider if you are at risk.  Ask your health care provider about whether you are at high risk for HIV. Your health care provider may  recommend a prescription medicine to help prevent HIV infection. If you choose to take medicine to prevent HIV, you should first get tested for HIV. You should then be tested every 3 months for as long as you are taking the medicine. Pregnancy  If you are about to stop having your period (premenopausal) and you may become pregnant, seek counseling before you get pregnant.  Take 400 to 800 micrograms (mcg) of folic acid every day if you become pregnant.  Ask for birth control (contraception) if you want to prevent pregnancy. Osteoporosis and menopause Osteoporosis is a disease in which the bones lose minerals and strength with aging. This can result in bone fractures. If you are 21 years old or older, or if you are at risk for osteoporosis and fractures, ask your health care provider if you should:  Be screened for bone loss.  Take a calcium or vitamin D supplement to lower your risk of fractures.  Be given hormone replacement therapy (HRT) to treat symptoms of menopause. Follow these instructions at home: Lifestyle  Do not use any products that contain nicotine or tobacco, such as cigarettes, e-cigarettes, and chewing tobacco. If you need help quitting, ask your health care provider.  Do not use street drugs.  Do not share needles.  Ask your health care provider for help if you need support or information about quitting drugs. Alcohol use  Do not drink alcohol if: ? Your health care provider tells you not to drink. ? You are pregnant, may be pregnant, or are planning to become pregnant.  If you drink alcohol: ? Limit how much you use to 0-1 drink a day. ? Limit intake if you are breastfeeding.  Be aware of how much alcohol is in your drink. In the [place] one drink equals one 12 oz bottle of beer (355 mL), one 5 oz glass of wine (148 mL), or one 1 oz glass of hard liquor (44 mL). General instructions  Schedule regular health, dental, and eye exams.  Stay current with your  vaccines.  Tell your health care provider if: ? You often feel depressed. ? You have ever been abused or do not feel safe at home. Summary  Adopting a healthy lifestyle and getting preventive care are important in promoting health and wellness.  Follow your health care provider's instructions about healthy diet, exercising, and getting tested or screened for diseases.  Follow your health care provider's instructions on monitoring your cholesterol and blood pressure. This information is not intended to replace advice given to you by your health care provider. Make sure you discuss any questions you have with your health care provider. Document Revised: 10/31/2018 Document Reviewed: 10/31/2018 Elsevier Patient Education  2021 [employer].

## 2021-02-22 ENCOUNTER — Other Ambulatory Visit: Payer: Self-pay

## 2021-02-22 ENCOUNTER — Ambulatory Visit (INDEPENDENT_AMBULATORY_CARE_PROVIDER_SITE_OTHER): Payer: PPO | Admitting: Internal Medicine

## 2021-02-22 ENCOUNTER — Other Ambulatory Visit (HOSPITAL_COMMUNITY): Payer: Self-pay

## 2021-02-22 ENCOUNTER — Encounter: Payer: Self-pay | Admitting: Internal Medicine

## 2021-02-22 VITALS — BP 130/78 | HR 66 | Temp 98.0°F | Ht 64.0 in | Wt 127.0 lb

## 2021-02-22 DIAGNOSIS — R739 Hyperglycemia, unspecified: Secondary | ICD-10-CM | POA: Diagnosis not present

## 2021-02-22 DIAGNOSIS — I2510 Atherosclerotic heart disease of native coronary artery without angina pectoris: Secondary | ICD-10-CM | POA: Diagnosis not present

## 2021-02-22 DIAGNOSIS — E785 Hyperlipidemia, unspecified: Secondary | ICD-10-CM

## 2021-02-22 DIAGNOSIS — M542 Cervicalgia: Secondary | ICD-10-CM | POA: Diagnosis not present

## 2021-02-22 DIAGNOSIS — R030 Elevated blood-pressure reading, without diagnosis of hypertension: Secondary | ICD-10-CM | POA: Diagnosis not present

## 2021-02-22 DIAGNOSIS — Z0000 Encounter for general adult medical examination without abnormal findings: Secondary | ICD-10-CM | POA: Diagnosis not present

## 2021-02-22 DIAGNOSIS — I351 Nonrheumatic aortic (valve) insufficiency: Secondary | ICD-10-CM | POA: Diagnosis not present

## 2021-02-22 DIAGNOSIS — M8589 Other specified disorders of bone density and structure, multiple sites: Secondary | ICD-10-CM

## 2021-02-22 DIAGNOSIS — E7849 Other hyperlipidemia: Secondary | ICD-10-CM

## 2021-02-22 DIAGNOSIS — I700 Atherosclerosis of aorta: Secondary | ICD-10-CM

## 2021-02-22 DIAGNOSIS — F32A Depression, unspecified: Secondary | ICD-10-CM

## 2021-02-22 DIAGNOSIS — E559 Vitamin D deficiency, unspecified: Secondary | ICD-10-CM

## 2021-02-22 DIAGNOSIS — F419 Anxiety disorder, unspecified: Secondary | ICD-10-CM | POA: Insufficient documentation

## 2021-02-22 DIAGNOSIS — F341 Dysthymic disorder: Secondary | ICD-10-CM

## 2021-02-22 DIAGNOSIS — I2584 Coronary atherosclerosis due to calcified coronary lesion: Secondary | ICD-10-CM

## 2021-02-22 LAB — COMPREHENSIVE METABOLIC PANEL
ALT: 17 U/L (ref 0–35)
AST: 21 U/L (ref 0–37)
Albumin: 4.4 g/dL (ref 3.5–5.2)
Alkaline Phosphatase: 60 U/L (ref 39–117)
BUN: 19 mg/dL (ref 6–23)
CO2: 29 mEq/L (ref 19–32)
Calcium: 9.6 mg/dL (ref 8.4–10.5)
Chloride: 105 mEq/L (ref 96–112)
Creatinine, Ser: 0.75 mg/dL (ref 0.40–1.20)
GFR: 78.03 mL/min (ref >60.00)
Glucose, Bld: 97 mg/dL (ref 70–99)
Potassium: 4.6 mEq/L (ref 3.5–5.1)
Sodium: 139 mEq/L (ref 135–145)
Total Bilirubin: 0.6 mg/dL (ref 0.2–1.2)
Total Protein: 6.8 g/dL (ref 6.0–8.3)

## 2021-02-22 LAB — CBC WITH DIFFERENTIAL/PLATELET
Basophils Absolute: 0 10*3/uL (ref 0.0–0.1)
Basophils Relative: 1 % (ref 0.0–3.0)
Eosinophils Absolute: 0.3 10*3/uL (ref 0.0–0.7)
Eosinophils Relative: 5.5 % — ABNORMAL HIGH (ref 0.0–5.0)
HCT: 41.3 % (ref 36.0–46.0)
Hemoglobin: 13.9 g/dL (ref 12.0–15.0)
Lymphocytes Relative: 26.9 % (ref 12.0–46.0)
Lymphs Abs: 1.3 10*3/uL (ref 0.7–4.0)
MCHC: 33.6 g/dL (ref 30.0–36.0)
MCV: 89.6 fl (ref 78.0–100.0)
Monocytes Absolute: 0.5 10*3/uL (ref 0.1–1.0)
Monocytes Relative: 9.5 % (ref 3.0–12.0)
Neutro Abs: 2.9 10*3/uL (ref 1.4–7.7)
Neutrophils Relative %: 57.1 % (ref 43.0–77.0)
Platelets: 201 10*3/uL (ref 150.0–400.0)
RBC: 4.6 Mil/uL (ref 3.87–5.11)
RDW: 13.5 % (ref 11.5–15.5)
WBC: 5 10*3/uL (ref 4.0–10.5)

## 2021-02-22 LAB — TSH: TSH: 2.34 u[IU]/mL (ref 0.35–4.50)

## 2021-02-22 LAB — HEMOGLOBIN A1C: Hgb A1c MFr Bld: 5.7 % (ref 4.6–6.5)

## 2021-02-22 LAB — LIPID PANEL
Cholesterol: 177 mg/dL (ref 0–200)
HDL: 86.3 mg/dL (ref >39.00)
LDL Cholesterol: 78 mg/dL (ref 0–99)
NonHDL: 90.83
Total CHOL/HDL Ratio: 2
Triglycerides: 66 mg/dL (ref 0.0–149.0)
VLDL: 13.2 mg/dL (ref 0.0–40.0)

## 2021-02-22 LAB — VITAMIN D 25 HYDROXY (VIT D DEFICIENCY, FRACTURES): VITD: 39.41 ng/mL (ref 30.00–100.00)

## 2021-02-22 MED ORDER — ESCITALOPRAM OXALATE 5 MG PO TABS
5.0000 mg | ORAL_TABLET | Freq: Every day | ORAL | 5 refills | Status: DC
  Filled 2021-02-22: qty 30, 30d supply, fill #0

## 2021-02-22 MED ORDER — TRAMADOL HCL 50 MG PO TABS
50.0000 mg | ORAL_TABLET | Freq: Four times a day (QID) | ORAL | 3 refills | Status: DC | PRN
  Filled 2021-02-22: qty 28, 7d supply, fill #0

## 2021-02-22 MED ORDER — DICLOFENAC SODIUM 75 MG PO TBEC
75.0000 mg | DELAYED_RELEASE_TABLET | Freq: Two times a day (BID) | ORAL | 3 refills | Status: DC | PRN
  Filled 2021-02-22 – 2021-02-23 (×2): qty 180, 90d supply, fill #0
  Filled 2021-06-15: qty 180, 90d supply, fill #1

## 2021-02-22 MED ORDER — ALPRAZOLAM 0.25 MG PO TABS
0.2500 mg | ORAL_TABLET | Freq: Every evening | ORAL | 5 refills | Status: DC | PRN
  Filled 2021-02-22 (×2): qty 30, 30d supply, fill #0

## 2021-02-22 MED ORDER — ROSUVASTATIN CALCIUM 5 MG PO TABS
ORAL_TABLET | Freq: Every day | ORAL | 3 refills | Status: DC
  Filled 2021-02-22 – 2021-04-23 (×2): qty 90, 90d supply, fill #0

## 2021-02-22 MED ORDER — ESTRADIOL-NORETHINDRONE ACET 1-0.5 MG PO TABS
1.0000 | ORAL_TABLET | Freq: Every day | ORAL | 3 refills | Status: DC
  Filled 2021-02-22: qty 84, 84d supply, fill #0
  Filled 2021-07-13: qty 84, 84d supply, fill #1
  Filled 2021-12-28: qty 84, 84d supply, fill #2

## 2021-02-22 NOTE — Assessment & Plan Note (Signed)
Chronic neck and back pain stable Continue tramadol 50 mg daily prn and xanax as a muscle relaxer Continue diclofenac 75 mg BID prn

## 2021-02-22 NOTE — Assessment & Plan Note (Addendum)
Chronic Asymptomatic-echo in July Monitored by Dr Cardiology

## 2021-02-22 NOTE — Assessment & Plan Note (Signed)
Chronic Taking vitamin D daily Check vitamin D level  

## 2021-02-22 NOTE — Assessment & Plan Note (Signed)
New problem Has had several elevated blood pressure readings, but primarily her blood pressure has been controlled She is exercising regularly Blood pressure here today is very good She will continue to monitor-if she does continue to get spikes may need to consider very low-dose ACE inhibitor or ARB

## 2021-02-22 NOTE — Assessment & Plan Note (Signed)
Chronic Check lipid panel  Continue crestor 5 mg daily Regular exercise and healthy diet encouraged  

## 2021-02-22 NOTE — Assessment & Plan Note (Signed)
Chronic a1c 

## 2021-02-22 NOTE — Assessment & Plan Note (Signed)
Chronic Mild overall Was on Wellbutrin in the past and is no longer taking Does have some very mild anxiety and depression and I think she would benefit from a low-dose SSRI Start Lexapro 5 mg daily-can adjust dose if needed

## 2021-02-22 NOTE — Assessment & Plan Note (Signed)
Chronic dexa up to date - due 9/22 at [place] regularly Continue vitamin d daily

## 2021-02-23 ENCOUNTER — Other Ambulatory Visit (HOSPITAL_COMMUNITY): Payer: Self-pay

## 2021-02-25 ENCOUNTER — Other Ambulatory Visit (HOSPITAL_COMMUNITY): Payer: Self-pay

## 2021-03-16 DIAGNOSIS — D1801 Hemangioma of skin and subcutaneous tissue: Secondary | ICD-10-CM | POA: Diagnosis not present

## 2021-03-16 DIAGNOSIS — L821 Other seborrheic keratosis: Secondary | ICD-10-CM | POA: Diagnosis not present

## 2021-03-16 DIAGNOSIS — Z85828 Personal history of other malignant neoplasm of skin: Secondary | ICD-10-CM | POA: Diagnosis not present

## 2021-03-16 DIAGNOSIS — I788 Other diseases of capillaries: Secondary | ICD-10-CM | POA: Diagnosis not present

## 2021-03-16 DIAGNOSIS — L814 Other melanin hyperpigmentation: Secondary | ICD-10-CM | POA: Diagnosis not present

## 2021-03-22 DIAGNOSIS — M542 Cervicalgia: Secondary | ICD-10-CM | POA: Diagnosis not present

## 2021-03-24 IMAGING — CT CT CARDIAC CORONARY ARTERY CALCIUM SCORE
3 series · 14 of 20 positions shown, 15 images · non-contrast
Comparison: Chest CT 04/01/2011.
COMPARISON: Chest CT 04/01/2011.

Addendum:
EXAM:
OVER-READ INTERPRETATION  CT CHEST

The following report is an over-read performed by radiologist Dr.
Alisahran Nathasya [REDACTED] on 06/15/2020. This
over-read does not include interpretation of cardiac or coronary
anatomy or pathology. The coronary calcium score interpretation by
the cardiologist is attached.
CLINICAL DATA: Risk stratification
Coronary Calcium Score
TECHNIQUE: The patient was scanned on a Siemens Force scanner. Axial
non-contrast 3 mm slices were carried out through the heart. The
data set was analyzed on a dedicated work station and scored using
the Agatson method.

[Series 2: casc 3.0 bv41 2 bestdiast 69 % · axial · 0.37mm/px · z∈[-238,-151]mm · 4 of 49 slices shown, 5 images]
[im 10/49  vessel]
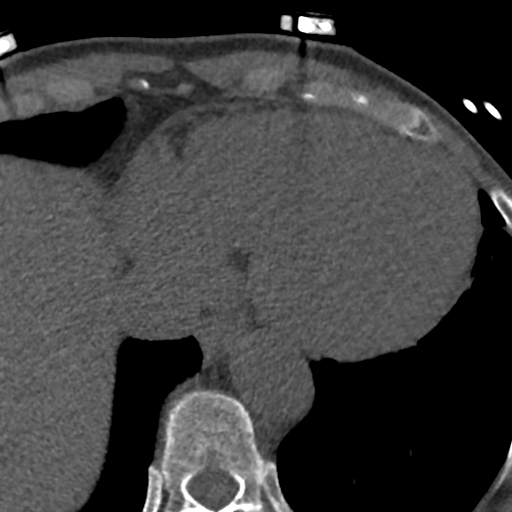
[im 10/49  lung]
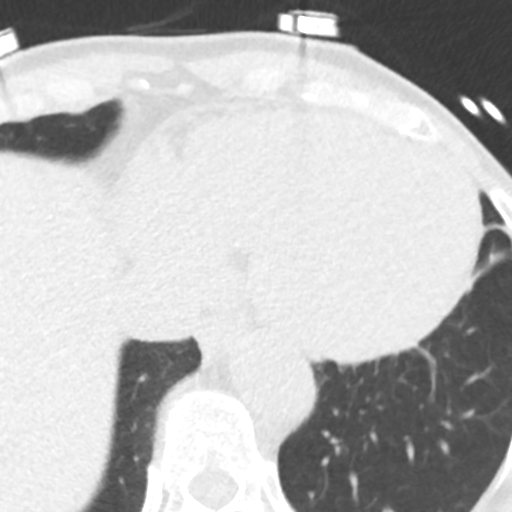
[im 20/49  vessel]
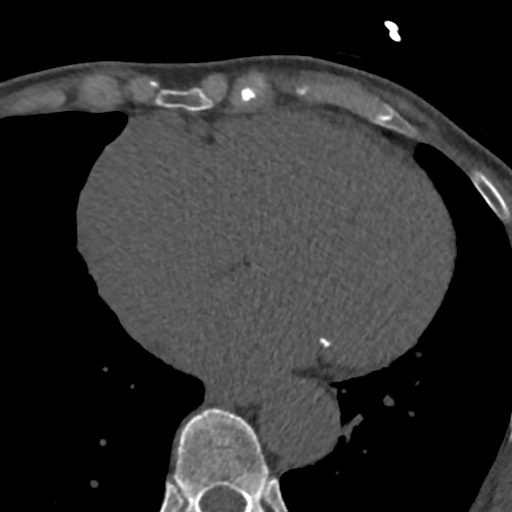
[im 29/49  vessel]
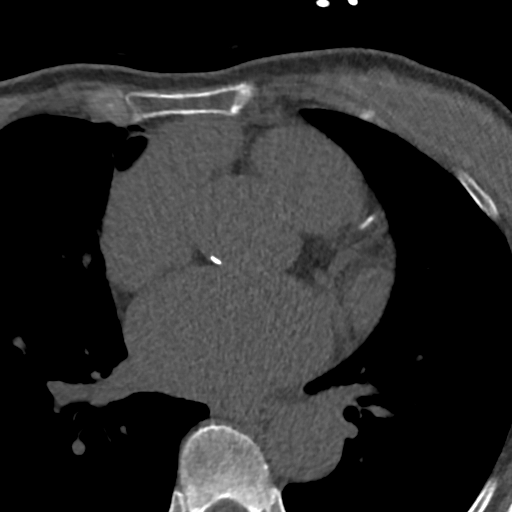
[im 39/49  vessel]
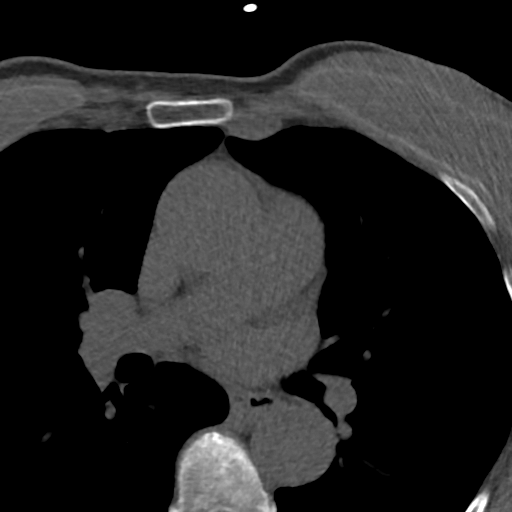

[Series 3: lung 76 % · axial · 0.64mm/px · z∈[-241,-145]mm · 5 of 49 slices shown]
[im 9/49  lung]
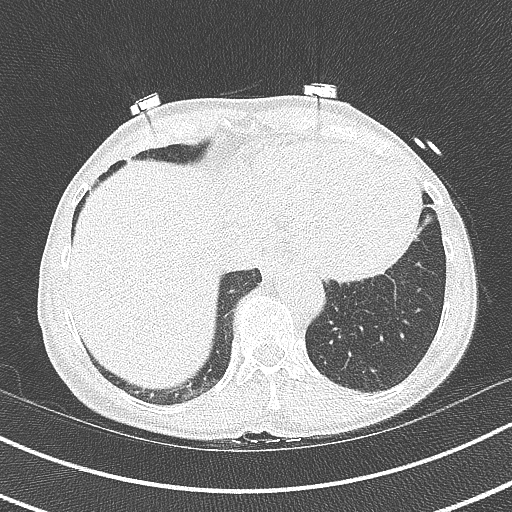
[im 17/49  lung]
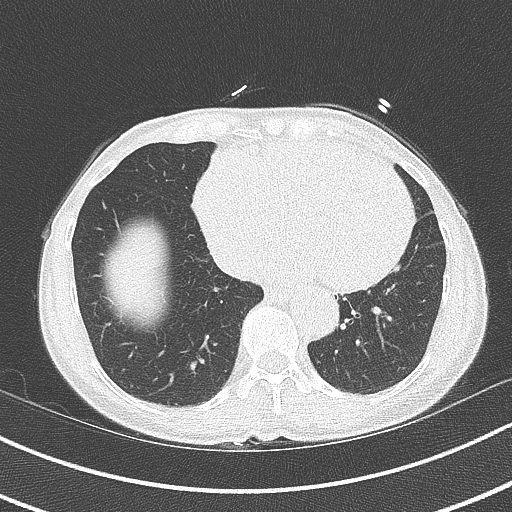
[im 25/49  lung]
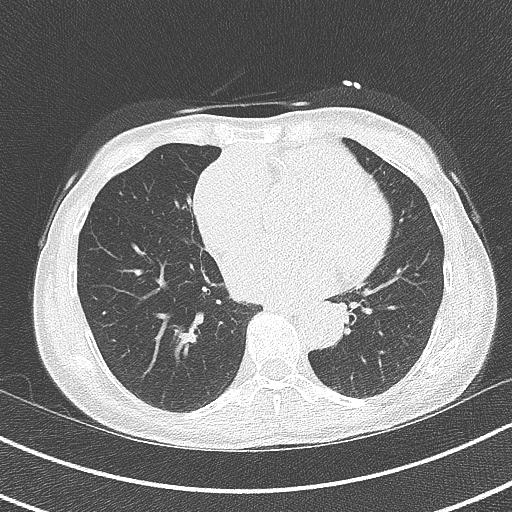
[im 33/49  lung]
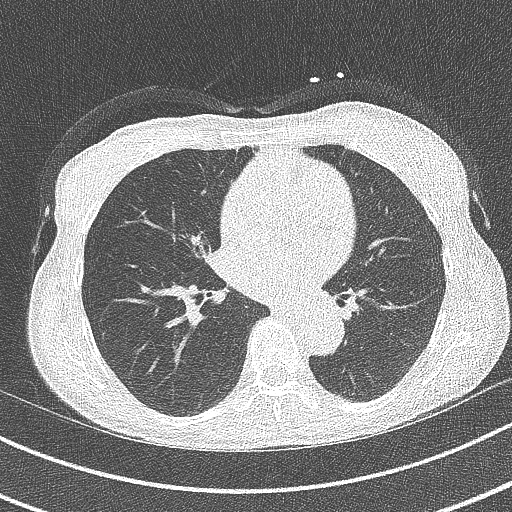
[im 41/49  lung]
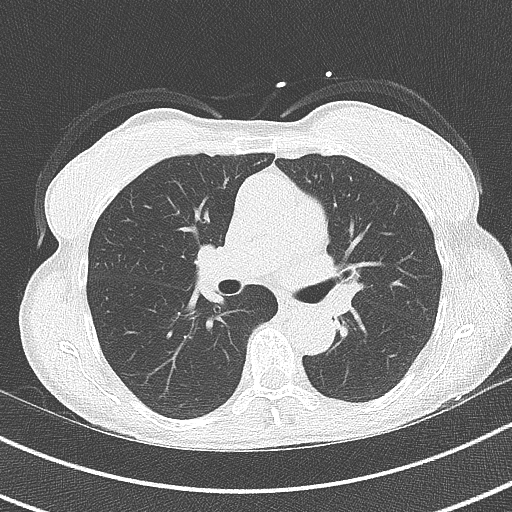

[Series 4: lung st 76 % · axial · 0.64mm/px · z∈[-241,-145]mm · 5 of 49 slices shown]
[im 9/49  lung]
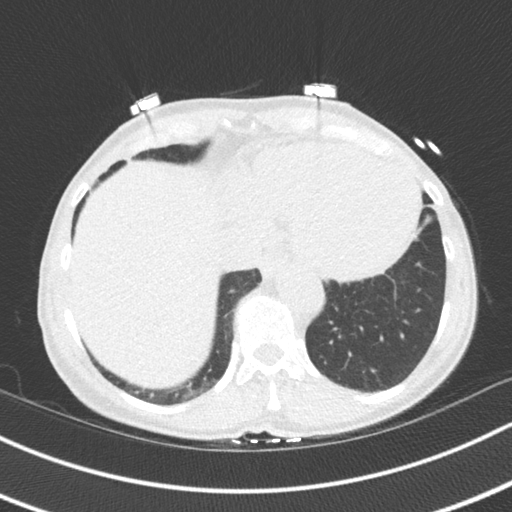
[im 17/49  lung]
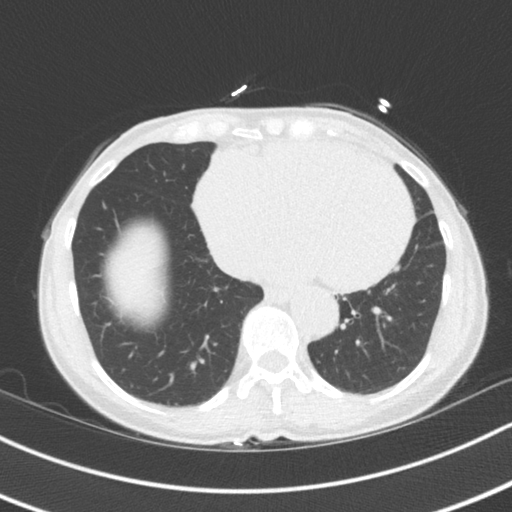
[im 25/49  lung]
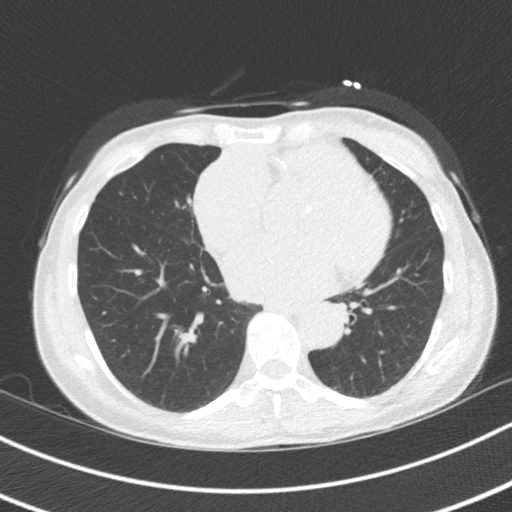
[im 33/49  lung]
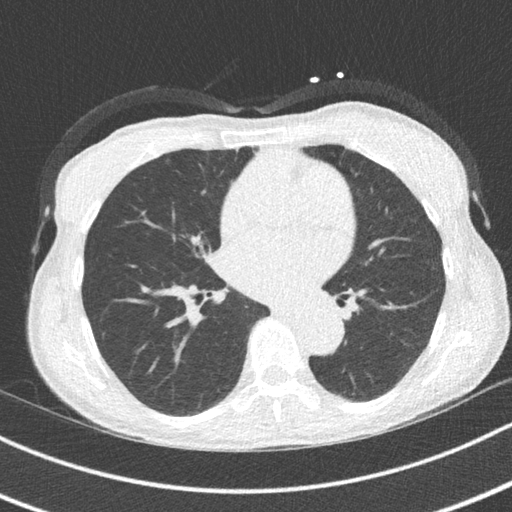
[im 41/49  lung]
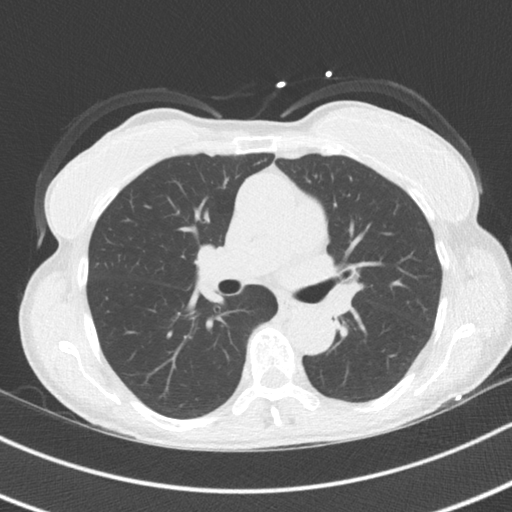

[14 of 20 positions shown; findings below may reference images not displayed]

FINDINGS: Aortic atherosclerosis. Within the visualized portions of the thorax
there are no suspicious appearing pulmonary nodules or masses, there
is no acute consolidative airspace disease, no pleural effusions, no
pneumothorax and no lymphadenopathy. Visualized portions of the
upper abdomen are unremarkable. There are no aggressive appearing
lytic or blastic lesions noted in the visualized portions of the
skeleton. Surgical clips in the left axillary region, likely from
prior lymph node dissection.
IMPRESSION: 1.  Aortic Atherosclerosis (BRVTN-VVM.M).
FINDINGS: Non-cardiac: See separate report from [REDACTED].

Ascending Aorta: Borderline enlarged size, measuring 38 mm at the
mid ascending aorta, measured double oblique at PA bifurcation.
Scattered trivial significant calcification.

Pericardium: Normal

Coronary arteries: Arise from normal coronary cusps.
IMPRESSION: Coronary calcium score of 257. This was percentile 74th for age and
sex matched control.

Aortic atherosclerosis.

Paulus N Ceejay

*** End of Addendum ***
EXAM:
OVER-READ INTERPRETATION  CT CHEST

The following report is an over-read performed by radiologist Dr.
Alisahran Nathasya [REDACTED] on 06/15/2020. This
over-read does not include interpretation of cardiac or coronary
anatomy or pathology. The coronary calcium score interpretation by
the cardiologist is attached.
FINDINGS: Aortic atherosclerosis. Within the visualized portions of the thorax
there are no suspicious appearing pulmonary nodules or masses, there
is no acute consolidative airspace disease, no pleural effusions, no
pneumothorax and no lymphadenopathy. Visualized portions of the
upper abdomen are unremarkable. There are no aggressive appearing
lytic or blastic lesions noted in the visualized portions of the
skeleton. Surgical clips in the left axillary region, likely from
prior lymph node dissection.
IMPRESSION: 1.  Aortic Atherosclerosis (BRVTN-VVM.M).

## 2021-03-29 DIAGNOSIS — M542 Cervicalgia: Secondary | ICD-10-CM | POA: Diagnosis not present

## 2021-04-05 DIAGNOSIS — M542 Cervicalgia: Secondary | ICD-10-CM | POA: Diagnosis not present

## 2021-04-08 ENCOUNTER — Telehealth: Payer: Self-pay | Admitting: Internal Medicine

## 2021-04-08 NOTE — Progress Notes (Signed)
  Chronic Care Management   Note  04/08/2021 Name: AIKA BRZOSKA, Valerie Phelps MRN: [id number] DOB: 1946/10/09  Valerie Dragon, Valerie Phelps is a 75 y.o. year old female who is a primary care patient of Burns, Claudina Lick, Valerie Phelps. I reached out to Valerie Dragon, Valerie Phelps by phone today in response to a referral sent by Ms. Valerie Dragon, Valerie Phelps's PCP, Quay Burow Claudina Lick, Valerie Phelps.   Valerie Phelps was given information about Chronic Care Management services today including:  1. CCM service includes personalized support from designated clinical staff supervised by her physician, including individualized plan of care and coordination with other care providers 2. 24/7 contact phone numbers for assistance for urgent and routine care needs. 3. Service will only be billed when office clinical staff spend 20 minutes or more in a month to coordinate care. 4. Only one practitioner may furnish and bill the service in a calendar month. 5. The patient may stop CCM services at any time (effective at the end of the month) by phone call to the office staff.   Patient wishes to consider information provided and/or speak with a member of the care team before deciding about enrollment in care management services.   Follow up plan:   [place]

## 2021-04-12 DIAGNOSIS — M542 Cervicalgia: Secondary | ICD-10-CM | POA: Diagnosis not present

## 2021-04-21 DIAGNOSIS — M542 Cervicalgia: Secondary | ICD-10-CM | POA: Diagnosis not present

## 2021-04-23 ENCOUNTER — Other Ambulatory Visit (HOSPITAL_COMMUNITY): Payer: Self-pay

## 2021-04-30 DIAGNOSIS — M542 Cervicalgia: Secondary | ICD-10-CM | POA: Diagnosis not present

## 2021-05-05 DIAGNOSIS — M542 Cervicalgia: Secondary | ICD-10-CM | POA: Diagnosis not present

## 2021-05-17 DIAGNOSIS — M542 Cervicalgia: Secondary | ICD-10-CM | POA: Diagnosis not present

## 2021-06-02 DIAGNOSIS — M542 Cervicalgia: Secondary | ICD-10-CM | POA: Diagnosis not present

## 2021-06-08 ENCOUNTER — Other Ambulatory Visit: Payer: Self-pay

## 2021-06-08 ENCOUNTER — Ambulatory Visit (HOSPITAL_COMMUNITY): Payer: PPO | Attending: Cardiology

## 2021-06-08 DIAGNOSIS — I351 Nonrheumatic aortic (valve) insufficiency: Secondary | ICD-10-CM

## 2021-06-08 DIAGNOSIS — Q231 Congenital insufficiency of aortic valve: Secondary | ICD-10-CM | POA: Diagnosis not present

## 2021-06-08 LAB — ECHOCARDIOGRAM COMPLETE
Area-P 1/2: 2.66 cm2
P 1/2 time: 486 msec
S' Lateral: 3.2 cm

## 2021-06-15 ENCOUNTER — Other Ambulatory Visit (HOSPITAL_COMMUNITY): Payer: Self-pay

## 2021-06-16 ENCOUNTER — Other Ambulatory Visit (HOSPITAL_COMMUNITY): Payer: Self-pay

## 2021-06-16 ENCOUNTER — Ambulatory Visit: Payer: PPO | Admitting: Internal Medicine

## 2021-06-16 ENCOUNTER — Other Ambulatory Visit: Payer: Self-pay

## 2021-06-16 ENCOUNTER — Encounter: Payer: Self-pay | Admitting: Internal Medicine

## 2021-06-16 VITALS — BP 142/86 | HR 48 | Ht 65.0 in | Wt 126.6 lb

## 2021-06-16 DIAGNOSIS — E785 Hyperlipidemia, unspecified: Secondary | ICD-10-CM

## 2021-06-16 DIAGNOSIS — I2510 Atherosclerotic heart disease of native coronary artery without angina pectoris: Secondary | ICD-10-CM

## 2021-06-16 MED ORDER — ROSUVASTATIN CALCIUM 10 MG PO TABS
10.0000 mg | ORAL_TABLET | Freq: Every day | ORAL | 3 refills | Status: DC
  Filled 2021-06-16: qty 52, 52d supply, fill #0
  Filled 2021-06-16: qty 38, 38d supply, fill #0
  Filled 2021-09-30: qty 90, 90d supply, fill #1
  Filled 2021-12-28: qty 90, 90d supply, fill #2
  Filled 2022-03-30: qty 90, 90d supply, fill #3

## 2021-06-16 MED ORDER — HYDROCHLOROTHIAZIDE 12.5 MG PO CAPS
12.5000 mg | ORAL_CAPSULE | Freq: Every day | ORAL | 3 refills | Status: DC
  Filled 2021-06-16: qty 20, 20d supply, fill #0
  Filled 2021-06-16: qty 70, 70d supply, fill #0
  Filled 2021-09-06: qty 90, 90d supply, fill #1
  Filled 2021-12-28: qty 90, 90d supply, fill #2
  Filled 2022-02-15 – 2022-03-30 (×2): qty 90, 90d supply, fill #3

## 2021-06-16 MED ORDER — ASPIRIN EC 81 MG PO TBEC: 81.0000 mg | DELAYED_RELEASE_TABLET | Freq: Every day | ORAL | 3 refills | Status: DC

## 2021-06-16 NOTE — Patient Instructions (Addendum)
Medication Instructions:  Your physician has recommended you make the following change in your medication:  1.) start asprin 81 mg - one tablet daily 2.) start hctz 12.5 mg - one tablet daily 3.) increase Crestor to 10 mg - take one tablet daily  *If you need a refill on your cardiac medications before your next appointment, please call your pharmacy*   Lab Work: BMET and [place] in about 10 days -  Aug 4 In 8 weeks NMR panel and CBC -   Sept 21   Testing/Procedures: none   Follow-Up: At [facility], you and your health needs are our priority.  As part of our continuing mission to provide you with exceptional heart care, we have created designated Provider Care Teams.  These Care Teams include your primary Cardiologist (physician) and Advanced Practice Providers (APPs -  Physician Assistants and Nurse Practitioners) who all work together to provide you with the care you need, when you need it.   Your next appointment:   12 month(s)  The format for your next appointment:   In Person  Provider:   [name], MD

## 2021-06-16 NOTE — Addendum Note (Signed)
Addended by: Fay Records on: 06/16/2021 06:23 PM   Modules accepted: Level of Service

## 2021-06-16 NOTE — Progress Notes (Addendum)
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   She continues to run 3 miles several times per week  No problems with this    Denies signif palpitations           Current Meds  Medication Sig   aspirin EC 81 MG tablet Take 1 tablet (81 mg total) by mouth daily. Swallow whole.   Cholecalciferol (VITAMIN D-3) 125 MCG (5000 UT) TABS Take 5,000 Units by mouth daily.   diclofenac (VOLTAREN) 75 MG EC tablet Take 1 tablet (75 mg total) by mouth 2 (two) times daily as needed for mild pain.   estradiol-norethindrone (ACTIVELLA) 1-0.5 MG tablet TAKE 1 TABLET BY MOUTH DAILY.   hydrochlorothiazide (MICROZIDE) 12.5 MG capsule Take 1 capsule (12.5 mg total) by mouth daily.   rosuvastatin (CRESTOR) 10 MG tablet Take 1 tablet (10 mg total) by mouth daily.   traMADol (ULTRAM) 50 MG tablet Take 1 tablet (50 mg total) by mouth every 6 (six) hours as needed.   [DISCONTINUED] rosuvastatin (CRESTOR) 5 MG tablet TAKE 1 TABLET (5 MG TOTAL) BY MOUTH DAILY.     Allergies:   Patient has no known allergies.   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    Past Surgical History:  Procedure Laterality Date   BREAST RECONSTRUCTION  1997-2013   implants exchanged   BREAST SURGERY  1995   rt and lt total mastectomies-reconstruction   COLONOSCOPY     FACIAL COSMETIC SURGERY     LYMPH NODE BIOPSY     Valerie Phelps  rt   Varicose vein injections       Social History:  The patient  reports that she has never smoked. She has never used smokeless tobacco. She reports current alcohol use of about 5.0 standard drinks of alcohol per week. She reports that she does not use drugs.   Family History:  The patient's family history includes Aneurysm (age of onset: 12) in her brother; Breast cancer (age of onset: 61) in her paternal grandmother; Breast cancer (age of onset: 11) in her maternal aunt; Colon cancer in her paternal grandfather; Osteoarthritis in her sister; Prostate cancer in her father.    ROS:  Please see the history of present illness. All other systems are reviewed and  Negative to the above problem except as noted.    PHYSICAL EXAM: VS:  BP (!) 142/86   Pulse (!) 48   Ht '5\' 5"'$  (1.651 m)   Wt 126 lb 9.6 oz (57.4 kg)   SpO2 99%   BMI 21.07 kg/m     GEN: Well nourished, well developed, in no acute distress  HEENT: normal   Neck: no JVD, carotid bruits, or masses Cardiac: RRR; no murmurs, rubs, or gallops,no edema  Respiratory:  clear to auscultation bilaterally, normal work of breathing GI: soft, nontender, nondistended, + BS  No hepatomegaly  MS: no deformity Moving all extremities   Skin: warm and dry, no rash Neuro:  Strength and sensation are intact Psych: euthymic mood, full affect   EKG:  EKG is ordered today.  SB 48 bpm  Nonspecific ST changes  Echo  06/08/21  1. Left ventricular ejection fraction, by estimation, is 55 to 60%. The left ventricle has normal function. The left ventricle has no regional wall motion abnormalities. There is mild left ventricular hypertrophy. Left ventricular diastolic parameters are indeterminate. 2. Right ventricular systolic function is normal. The right ventricular size is normal. There is mildly elevated pulmonary artery systolic pressure. The estimated right ventricular systolic pressure is [id number] mmHg. 3. Left atrial size was severely dilated. 4. Right atrial size was severely dilated. 5. The mitral valve is normal in structure. Trivial mitral valve regurgitation. 6. The inferior vena cava is dilated in size with <50% respiratory variability, suggesting right atrial pressure of 15 mmHg. 7. The aortic valve is bicuspid. Fusion of left and right cusps. Aortic valve regurgitation is moderate. Mild aortic valve sclerosis is present, with no evidence of aortic valve stenosis.   Lipid Panel    Component Value Date/Time   CHOL 177 02/22/2021 0927   CHOL 193 07/30/2020 0941   CHOL 187 07/18/2016 0734   TRIG 66.0 02/22/2021 0927   TRIG 57 07/31/2017 0748   HDL 86.30 02/22/2021 0927   HDL 86 07/30/2020 0941   HDL 101 07/31/2017 0748   CHOLHDL 2 02/22/2021 0927   VLDL 13.2 02/22/2021 0927   LDLCALC 78 02/22/2021 0927   LDLCALC 93 07/30/2020 0941   LDLCALC 70 07/18/2016 0734      Wt Readings from Last 3 Encounters:  06/16/21 126 lb 9.6 oz (57.4 kg)   02/22/21 127 lb (57.6 kg)  02/20/20 125 lb 12.8 oz (57.1 kg)      ASSESSMENT AND PLAN:  1   Aortic valve dz   pt with funcitonally bicuspid AV    AI appears moderate   I owuld continue to follow   Would recomm tight control of BP  2  HTN   BP at home 110s to 150s    Averges 130s to  140s   Today very high on my check   180/ bilaterally    I would recomm low dose HCTZ 12.5 mg    Follow up BMET in 10 days  3  CAD    No symptoms to sugg angina   Follow   With CA score of 257 I would recomm ecASA 81 mg   4  HL   Last lipid panel showed LDL 78   I would increase crestor to 10 mg daily   Check lipomed in 8 wks      5  hx PACs, PVCs   PT denies significant symtpoms     Follow for now   With biatrial enlargement is at risk for afib     WIll review labs   Pt to send in BP readings    F/U next spring in clinc    Current medicines are reviewed at length with the patient today.  The patient does not have concerns regarding medicines.  Signed, [name], MD  06/16/2021 5:46 PM    [facility] [place], [place], [place]  [address] Phone: [phone]; Fax: [phone]

## 2021-06-24 ENCOUNTER — Other Ambulatory Visit: Payer: Self-pay

## 2021-06-24 ENCOUNTER — Other Ambulatory Visit: Payer: PPO

## 2021-06-24 DIAGNOSIS — E785 Hyperlipidemia, unspecified: Secondary | ICD-10-CM | POA: Diagnosis not present

## 2021-06-24 DIAGNOSIS — I2510 Atherosclerotic heart disease of native coronary artery without angina pectoris: Secondary | ICD-10-CM

## 2021-06-24 LAB — BASIC METABOLIC PANEL
BUN/Creatinine Ratio: 25 (ref 12–28)
BUN: 23 mg/dL (ref 8–27)
CO2: 24 mmol/L (ref 20–29)
Calcium: 10.1 mg/dL (ref 8.7–10.3)
Chloride: 103 mmol/L (ref 96–106)
Creatinine, Ser: 0.92 mg/dL (ref 0.57–1.00)
Glucose: 114 mg/dL — ABNORMAL HIGH (ref 65–99)
Potassium: 4.8 mmol/L (ref 3.5–5.2)
Sodium: 141 mmol/L (ref 134–144)
eGFR: 65 mL/min/{1.73_m2} (ref >59)

## 2021-06-24 LAB — MAGNESIUM: Magnesium: 1.9 mg/dL (ref 1.6–2.3)

## 2021-07-05 DIAGNOSIS — M542 Cervicalgia: Secondary | ICD-10-CM | POA: Diagnosis not present

## 2021-07-14 ENCOUNTER — Other Ambulatory Visit (HOSPITAL_COMMUNITY): Payer: Self-pay

## 2021-07-27 ENCOUNTER — Encounter: Payer: Self-pay | Admitting: Internal Medicine

## 2021-07-29 ENCOUNTER — Telehealth: Payer: Self-pay

## 2021-07-29 NOTE — Telephone Encounter (Signed)
Has order in chart from April '22

## 2021-07-31 ENCOUNTER — Encounter: Payer: Self-pay | Admitting: Internal Medicine

## 2021-08-02 DIAGNOSIS — M542 Cervicalgia: Secondary | ICD-10-CM | POA: Diagnosis not present

## 2021-08-04 ENCOUNTER — Inpatient Hospital Stay: Admission: RE | Admit: 2021-08-04 | Payer: PPO | Source: Ambulatory Visit

## 2021-08-05 ENCOUNTER — Ambulatory Visit: Payer: PPO

## 2021-08-11 ENCOUNTER — Other Ambulatory Visit: Payer: Self-pay

## 2021-08-11 ENCOUNTER — Other Ambulatory Visit: Payer: PPO | Admitting: *Deleted

## 2021-08-11 DIAGNOSIS — I2510 Atherosclerotic heart disease of native coronary artery without angina pectoris: Secondary | ICD-10-CM

## 2021-08-11 DIAGNOSIS — E785 Hyperlipidemia, unspecified: Secondary | ICD-10-CM

## 2021-08-12 LAB — CBC
Hematocrit: 40 % (ref 34.0–46.6)
Hemoglobin: 13.2 g/dL (ref 11.1–15.9)
MCH: 29.9 pg (ref 26.6–33.0)
MCHC: 33 g/dL (ref 31.5–35.7)
MCV: 91 fL (ref 79–97)
Platelets: 229 10*3/uL (ref 150–450)
RBC: 4.41 x10E6/uL (ref 3.77–5.28)
RDW: 12.5 % (ref 11.7–15.4)
WBC: 5.1 10*3/uL (ref 3.4–10.8)

## 2021-08-12 LAB — NMR, LIPOPROFILE
Cholesterol, Total: 193 mg/dL (ref 100–199)
HDL Particle Number: 36.5 umol/L (ref >=30.5)
HDL-C: 96 mg/dL (ref >39)
LDL Particle Number: 955 nmol/L (ref <1000)
LDL Size: 21.3 nm (ref >20.5)
LDL-C (NIH Calc): 84 mg/dL (ref 0–99)
LP-IR Score: <25 (ref <=45)
Small LDL Particle Number: <90 nmol/L (ref <=527)
Triglycerides: 70 mg/dL (ref 0–149)

## 2021-08-13 ENCOUNTER — Ambulatory Visit (INDEPENDENT_AMBULATORY_CARE_PROVIDER_SITE_OTHER)
Admission: RE | Admit: 2021-08-13 | Discharge: 2021-08-13 | Disposition: A | Discharge status: Home / Self Care | Payer: PPO | Source: Ambulatory Visit | Attending: Internal Medicine | Admitting: Internal Medicine

## 2021-08-13 ENCOUNTER — Other Ambulatory Visit: Payer: Self-pay

## 2021-08-13 DIAGNOSIS — M8589 Other specified disorders of bone density and structure, multiple sites: Secondary | ICD-10-CM | POA: Diagnosis not present

## 2021-08-16 ENCOUNTER — Telehealth: Payer: Self-pay | Admitting: Internal Medicine

## 2021-08-16 NOTE — Telephone Encounter (Signed)
Spoke to patient She is happy with numbers  Does not want to go up on Crestor   Tolerating current dose I did discuss possible option of Zetia to block absorption    Follow responsee in 8 wks   She will reflect  Overall BP measurements have been very good   109-120s  Tolerating well, rare lightheadedness  Told pt to mychart with decision for changes

## 2021-08-18 ENCOUNTER — Inpatient Hospital Stay: Admission: RE | Admit: 2021-08-18 | Payer: PPO | Source: Ambulatory Visit

## 2021-09-06 ENCOUNTER — Other Ambulatory Visit (HOSPITAL_COMMUNITY): Payer: Self-pay

## 2021-09-06 DIAGNOSIS — M542 Cervicalgia: Secondary | ICD-10-CM | POA: Diagnosis not present

## 2021-09-16 ENCOUNTER — Encounter: Payer: Self-pay | Admitting: Internal Medicine

## 2021-09-16 DIAGNOSIS — G8929 Other chronic pain: Secondary | ICD-10-CM

## 2021-09-16 DIAGNOSIS — M542 Cervicalgia: Secondary | ICD-10-CM

## 2021-09-17 ENCOUNTER — Ambulatory Visit (INDEPENDENT_AMBULATORY_CARE_PROVIDER_SITE_OTHER): Payer: PPO

## 2021-09-17 DIAGNOSIS — M542 Cervicalgia: Secondary | ICD-10-CM

## 2021-09-17 DIAGNOSIS — G8929 Other chronic pain: Secondary | ICD-10-CM

## 2021-09-30 ENCOUNTER — Other Ambulatory Visit (HOSPITAL_COMMUNITY): Payer: Self-pay

## 2021-10-18 DIAGNOSIS — M542 Cervicalgia: Secondary | ICD-10-CM | POA: Diagnosis not present

## 2021-11-16 ENCOUNTER — Encounter: Payer: Self-pay | Admitting: Internal Medicine

## 2021-11-16 ENCOUNTER — Other Ambulatory Visit (HOSPITAL_COMMUNITY): Payer: Self-pay

## 2021-11-16 MED ORDER — NIRMATRELVIR/RITONAVIR (PAXLOVID)TABLET
3.0000 | ORAL_TABLET | Freq: Two times a day (BID) | ORAL | 0 refills | Status: AC
  Filled 2021-11-16: qty 30, 5d supply, fill #0

## 2021-11-29 DIAGNOSIS — M542 Cervicalgia: Secondary | ICD-10-CM | POA: Diagnosis not present

## 2021-12-28 ENCOUNTER — Other Ambulatory Visit (HOSPITAL_COMMUNITY): Payer: Self-pay

## 2022-01-10 DIAGNOSIS — M542 Cervicalgia: Secondary | ICD-10-CM | POA: Diagnosis not present

## 2022-02-11 DIAGNOSIS — Z85828 Personal history of other malignant neoplasm of skin: Secondary | ICD-10-CM | POA: Diagnosis not present

## 2022-02-11 DIAGNOSIS — D1801 Hemangioma of skin and subcutaneous tissue: Secondary | ICD-10-CM | POA: Diagnosis not present

## 2022-02-11 DIAGNOSIS — D2262 Melanocytic nevi of left upper limb, including shoulder: Secondary | ICD-10-CM | POA: Diagnosis not present

## 2022-02-11 DIAGNOSIS — L821 Other seborrheic keratosis: Secondary | ICD-10-CM | POA: Diagnosis not present

## 2022-02-11 DIAGNOSIS — L814 Other melanin hyperpigmentation: Secondary | ICD-10-CM | POA: Diagnosis not present

## 2022-02-11 DIAGNOSIS — L820 Inflamed seborrheic keratosis: Secondary | ICD-10-CM | POA: Diagnosis not present

## 2022-02-11 DIAGNOSIS — D2272 Melanocytic nevi of left lower limb, including hip: Secondary | ICD-10-CM | POA: Diagnosis not present

## 2022-02-11 DIAGNOSIS — L570 Actinic keratosis: Secondary | ICD-10-CM | POA: Diagnosis not present

## 2022-02-11 DIAGNOSIS — D2271 Melanocytic nevi of right lower limb, including hip: Secondary | ICD-10-CM | POA: Diagnosis not present

## 2022-02-15 ENCOUNTER — Other Ambulatory Visit (HOSPITAL_COMMUNITY): Payer: Self-pay

## 2022-02-21 DIAGNOSIS — M542 Cervicalgia: Secondary | ICD-10-CM | POA: Diagnosis not present

## 2022-02-23 ENCOUNTER — Encounter: Payer: Self-pay | Admitting: Internal Medicine

## 2022-02-23 NOTE — Progress Notes (Signed)
? ? ?Subjective:  ? ? Patient ID: Valerie Dragon, MD, female    DOB: 07-Oct-1946, 76 y.o.   MRN: [id number] ? ? ?This visit occurred during the SARS-CoV-2 public health emergency.  Safety protocols were in place, including screening questions prior to the visit, additional usage of staff PPE, and extensive cleaning of exam room while observing appropriate contact time as indicated for disinfecting solutions. ? ? ? ?HPI ?Valerie Phelps is here for  ?Chief Complaint  ?Patient presents with  ? Annual Exam  ?  Wants xrays of hand, back and pelvis xray (Lspine/pelvis) x-ray of both knees; Refill on Wellbutrin and Valium refill also.  ? ? ? ?Overall doing well.  Has pain in hands and several other joints, which she thinks is likely osteoarthritis.  She is interested in getting some x-rays to see severity.  She would also like to see Dr. Amedeo Plenty to discuss her hands. ? ?She will be having surgery and needs clearance form signed and some additional blood work, EKG.  Surgery is elective and cosmetic. ? ? ?She continues to be very active and is running regularly. ? ? ?Medications and allergies reviewed with patient and updated if appropriate. ? ? ? ?Current Outpatient Medications on File Prior to Visit  ?Medication Sig Dispense Refill  ? aspirin EC 81 MG tablet Take 1 tablet (81 mg total) by mouth daily. Swallow whole. 90 tablet 3  ? Cholecalciferol (VITAMIN D-3) 125 MCG (5000 UT) TABS Take 5,000 Units by mouth daily. (Patient taking differently: Take 2,000 Units by mouth daily.) 30 tablet   ? D2000 ULTRA STRENGTH 50 MCG (2000 UT) CAPS SMARTSIG:1 By Mouth    ? diclofenac (VOLTAREN) 75 MG EC tablet Take 1 tablet (75 mg total) by mouth 2 (two) times daily as needed for mild pain. 180 tablet 3  ? estradiol-norethindrone (ACTIVELLA) 1-0.5 MG tablet TAKE 1 TABLET BY MOUTH DAILY. 84 tablet 3  ? hydrochlorothiazide (MICROZIDE) 12.5 MG capsule Take 1 capsule (12.5 mg total) by mouth daily. 90 capsule 3  ? rosuvastatin (CRESTOR) 10 MG tablet Take 1  tablet (10 mg total) by mouth daily. 90 tablet 3  ? traMADol (ULTRAM) 50 MG tablet Take 1 tablet (50 mg total) by mouth every 6 (six) hours as needed. 60 tablet 3  ? WELLBUTRIN XL 150 MG 24 hr tablet SMARTSIG:1 By Mouth    ? ?No current facility-administered medications on file prior to visit.  ? ? ?Review of Systems  ?Constitutional:  Negative for fever.  ?Eyes:  Negative for visual disturbance.  ?Respiratory:  Negative for cough, shortness of breath and wheezing.   ?Cardiovascular:  Positive for palpitations. Negative for chest pain and leg swelling.  ?Gastrointestinal:  Positive for constipation (controlled). Negative for abdominal pain, blood in stool, diarrhea and nausea.  ?Genitourinary:  Negative for dysuria.  ?Musculoskeletal:  Positive for arthralgias and back pain.  ?Skin:  Negative for rash.  ?Neurological:  Negative for light-headedness and headaches.  ?Psychiatric/Behavioral:  Negative for dysphoric mood. The patient is not nervous/anxious.   ? ?   ?Objective:  ? ?Vitals:  ? 02/24/22 1009  ?BP: 130/68  ?Pulse: 73  ?Temp: 97.9 ?F (36.6 ?C)  ?SpO2: 99%  ? ?Filed Weights  ? 02/24/22 1009  ?Weight: 125 lb 12.8 oz (57.1 kg)  ? ?Body mass index is 21.59 kg/m?. ? ?BP Readings from Last 3 Encounters:  ?02/24/22 130/68  ?06/16/21 (!) 142/86  ?02/22/21 130/78  ? ? ?Wt Readings from Last 3 Encounters:  ?02/24/22 125  lb 12.8 oz (57.1 kg)  ?06/16/21 126 lb 9.6 oz (57.4 kg)  ?02/22/21 127 lb (57.6 kg)  ? ? ? ?  02/22/2021  ? 12:40 PM 02/20/2020  ?  8:08 AM 12/21/2018  ?  8:29 AM 03/10/2016  ?  2:36 PM 07/16/2015  ?  8:45 AM  ?Depression screen PHQ 2/9  ?Decreased Interest 0 0 0 0 0  ?Down, Depressed, Hopeless 0 0 0 0 0  ?PHQ - 2 Score 0 0 0 0 0  ?Altered sleeping 0  0    ?Tired, decreased energy 0  0    ?Change in appetite 0  0    ?Feeling bad or failure about yourself  0  0    ?Trouble concentrating 0  0    ?Moving slowly or fidgety/restless 0  0    ?Suicidal thoughts 0  0    ?PHQ-9 Score 0  0    ?Difficult doing  work/chores Not difficult at all      ? ? ? ? ?  02/22/2021  ? 12:40 PM  ?GAD 7 : Generalized Anxiety Score  ?Nervous, Anxious, on Edge 1  ?Control/stop worrying 1  ?Worry too much - different things 1  ?Trouble relaxing 0  ?Restless 0  ?Easily annoyed or irritable 1  ?Afraid - awful might happen 0  ?Total GAD 7 Score 4  ?Anxiety Difficulty Somewhat difficult  ? ? ? ? ?  ?Physical Exam ?Constitutional: She appears well-developed and well-nourished. No distress.  ?HENT:  ?Head: Normocephalic and atraumatic.  ?Right Ear: External ear normal. Normal ear canal and TM ?Left Ear: External ear normal.  Normal ear canal and TM ?Mouth/Throat: Oropharynx is clear and moist.  ?Eyes: Conjunctivae and EOM are normal.  ?Neck: Neck supple. No tracheal deviation present. No thyromegaly present.  ?No carotid bruit  ?Cardiovascular: Normal rate, regular rhythm and normal heart sounds.   ?No murmur heard.  No edema. ?Pulmonary/Chest: Effort normal and breath sounds normal. No respiratory distress. She has no wheezes. She has no rales.  ?Breast: deferred   ?Abdominal: Soft. She exhibits no distension. There is no tenderness.  ?Lymphadenopathy: She has no cervical adenopathy.  ?Skin: Skin is warm and dry. She is not diaphoretic.  ?Psychiatric: She has a normal mood and affect. Her behavior is normal.  ? ? ? ?Lab Results  ?Component Value Date  ? WBC 5.3 02/24/2022  ? HGB 13.6 02/24/2022  ? HCT 41.2 02/24/2022  ? PLT 219.0 02/24/2022  ? GLUCOSE 95 02/24/2022  ? CHOL 191 02/24/2022  ? TRIG 58.0 02/24/2022  ? HDL 89.00 02/24/2022  ? [place] 91 02/24/2022  ? ALT 20 02/24/2022  ? AST 27 02/24/2022  ? NA 140 02/24/2022  ? K 4.2 02/24/2022  ? CL 104 02/24/2022  ? CREATININE 1.05 02/24/2022  ? BUN 28 (H) 02/24/2022  ? CO2 29 02/24/2022  ? TSH 2.59 02/24/2022  ? INR 1.0 02/24/2022  ? HGBA1C 5.9 02/24/2022  ? ? ? ? ?   ?Assessment & Plan:  ? ?Physical exam: ?Screening blood work  ordered ?Exercise  regular-running, yard work, biking ?Weight   normal ?Substance abuse  none ? ? ?Reviewed recommended immunizations. ? ? ?Health Maintenance  ?Topic Date Due  ? COVID-19 Vaccine (4 - Booster for [place] series) 07/27/2022 (Originally 09/24/2021)  ? TETANUS/TDAP  02/25/2023 (Originally 03/08/2021)  ? INFLUENZA VACCINE  06/21/2022  ? DEXA SCAN  08/14/2023  ? Pneumonia Vaccine 7+ Years old  Completed  ? Hepatitis C Screening  Completed  ? Zoster Vaccines- Shingrix  Completed  ? HPV VACCINES  Aged Out  ? COLONOSCOPY (Pts 45-36yr Insurance coverage will need to be confirmed)  Discontinued  ?  ? ? ? ? ? ? ?See Problem List for Assessment and Plan of chronic medical problems. ? ? ? ? ?

## 2022-02-23 NOTE — Patient Instructions (Addendum)
? ? ? ?Blood work was ordered.  Xrays ordered.  ? ? ?Medications changes include :   none ? ? ?Your prescription(s) have been sent to your pharmacy.  ? ? ? ?Return in about 1 year (around 02/25/2023) for CPE. ? ? ? ?[place] [facility] ?[phone] - Audrea Muscat is very good. ? ?Dr Mel Almond - [phone] ?Dr Ernestene Mention   [phone] ?Sandford Craze - clinical social worker/therapist -  [phone] ? ?[facility] ?[place] ?[phone] ? ?[facility]  ?[phone] ? ?[facility] ?[place] ?[phone] Office ? ? ? ? ? ?Health Maintenance, Female ?Adopting a healthy lifestyle and getting preventive care are important in promoting health and wellness. Ask your health care provider about: ?The right schedule for you to have regular tests and exams. ?Things you can do on your own to prevent diseases and keep yourself healthy. ?What should I know about diet, weight, and exercise? ?Eat a healthy diet ? ?Eat a diet that includes plenty of vegetables, fruits, low-fat dairy products, and lean protein. ?Do not eat a lot of foods that are high in solid fats, added sugars, or sodium. ?Maintain a healthy weight ?Body mass index (BMI) is used to identify weight problems. It estimates body fat based on height and weight. Your health care provider can help determine your BMI and help you achieve or maintain a healthy weight. ?Get regular exercise ?Get regular exercise. This is one of the most important things you can do for your health. Most adults should: ?Exercise for at least 150 minutes each week. The exercise should increase your heart rate and make you sweat (moderate-intensity exercise). ?Do strengthening exercises at least twice a week. This is in addition to the moderate-intensity exercise. ?Spend less time sitting. Even light  physical activity can be beneficial. ?Watch cholesterol and blood lipids ?Have your blood tested for lipids and cholesterol at 76 years of age, then have this test every 5 years. ?Have your cholesterol levels checked more often if: ?Your lipid or cholesterol levels are high. ?You are older than 76 years of age. ?You are at high risk for heart disease. ?What should I know about cancer screening? ?Depending on your health history and family history, you may need to have cancer screening at various ages. This may include screening for: ?Breast cancer. ?Cervical cancer. ?Colorectal cancer. ?Skin cancer. ?Lung cancer. ?What should I know about heart disease, diabetes, and high blood pressure? ?Blood pressure and heart disease ?High blood pressure causes heart disease and increases the risk of stroke. This is more likely to develop in people who have high blood pressure readings or are overweight. ?Have your blood pressure checked: ?Every 3-5 years if you are 40-51 years of age. ?Every year if you are 3 years old or older. ?Diabetes ?Have regular diabetes screenings. This checks your fasting blood sugar level. Have the screening done: ?Once every three years after age 61 if you are at a normal weight and have a low risk for diabetes. ?More often and at a younger age if you are overweight or have a high risk for diabetes. ?What should I know about preventing infection? ?Hepatitis B ?If you have a higher risk for hepatitis B, you should be screened for this virus. Talk with your health care provider to find out if you are at risk for hepatitis B infection. ?  Hepatitis C ?Testing is recommended for: ?Everyone born from [birth year] through 1965. ?Anyone with known risk factors for hepatitis C. ?Sexually transmitted infections (STIs) ?Get screened for STIs, including gonorrhea and chlamydia, if: ?You are sexually active and are younger than 76 years of age. ?You are older than 76 years of age and your health care provider tells you  that you are at risk for this type of infection. ?Your sexual activity has changed since you were last screened, and you are at increased risk for chlamydia or gonorrhea. Ask your health care provider if you are at risk. ?Ask your health care provider about whether you are at high risk for HIV. Your health care provider may recommend a prescription medicine to help prevent HIV infection. If you choose to take medicine to prevent HIV, you should first get tested for HIV. You should then be tested every 3 months for as long as you are taking the medicine. ?Pregnancy ?If you are about to stop having your period (premenopausal) and you may become pregnant, seek counseling before you get pregnant. ?Take 400 to 800 micrograms (mcg) of folic acid every day if you become pregnant. ?Ask for birth control (contraception) if you want to prevent pregnancy. ?Osteoporosis and menopause ?Osteoporosis is a disease in which the bones lose minerals and strength with aging. This can result in bone fractures. If you are 17 years old or older, or if you are at risk for osteoporosis and fractures, ask your health care provider if you should: ?Be screened for bone loss. ?Take a calcium or vitamin D supplement to lower your risk of fractures. ?Be given hormone replacement therapy (HRT) to treat symptoms of menopause. ?Follow these instructions at home: ?Alcohol use ?Do not drink alcohol if: ?Your health care provider tells you not to drink. ?You are pregnant, may be pregnant, or are planning to become pregnant. ?If you drink alcohol: ?Limit how much you have to: ?0-1 drink a day. ?Know how much alcohol is in your drink. In the [place] one drink equals one 12 oz bottle of beer (355 mL), one 5 oz glass of wine (148 mL), or one 1? oz glass of hard liquor (44 mL). ?Lifestyle ?Do not use any products that contain nicotine or tobacco. These products include cigarettes, chewing tobacco, and vaping devices, such as e-cigarettes. If you need help  quitting, ask your health care provider. ?Do not use street drugs. ?Do not share needles. ?Ask your health care provider for help if you need support or information about quitting drugs. ?General instructions ?Schedule regular health, dental, and eye exams. ?Stay current with your vaccines. ?Tell your health care provider if: ?You often feel depressed. ?You have ever been abused or do not feel safe at home. ?Summary ?Adopting a healthy lifestyle and getting preventive care are important in promoting health and wellness. ?Follow your health care provider's instructions about healthy diet, exercising, and getting tested or screened for diseases. ?Follow your health care provider's instructions on monitoring your cholesterol and blood pressure. ?This information is not intended to replace advice given to you by your health care provider. Make sure you discuss any questions you have with your health care provider. ?Document Revised: 03/29/2021 Document Reviewed: 03/29/2021 ?Elsevier Patient Education ? [place]. ? ?

## 2022-02-24 ENCOUNTER — Ambulatory Visit (INDEPENDENT_AMBULATORY_CARE_PROVIDER_SITE_OTHER): Payer: PPO

## 2022-02-24 ENCOUNTER — Other Ambulatory Visit (HOSPITAL_COMMUNITY): Payer: Self-pay

## 2022-02-24 ENCOUNTER — Ambulatory Visit (INDEPENDENT_AMBULATORY_CARE_PROVIDER_SITE_OTHER): Payer: PPO | Admitting: Internal Medicine

## 2022-02-24 VITALS — BP 130/68 | HR 73 | Temp 97.9°F | Ht 64.0 in | Wt 125.8 lb

## 2022-02-24 DIAGNOSIS — Z853 Personal history of malignant neoplasm of breast: Secondary | ICD-10-CM | POA: Diagnosis not present

## 2022-02-24 DIAGNOSIS — M25552 Pain in left hip: Secondary | ICD-10-CM

## 2022-02-24 DIAGNOSIS — Z0000 Encounter for general adult medical examination without abnormal findings: Secondary | ICD-10-CM

## 2022-02-24 DIAGNOSIS — M25569 Pain in unspecified knee: Secondary | ICD-10-CM

## 2022-02-24 DIAGNOSIS — M1711 Unilateral primary osteoarthritis, right knee: Secondary | ICD-10-CM | POA: Diagnosis not present

## 2022-02-24 DIAGNOSIS — G8929 Other chronic pain: Secondary | ICD-10-CM

## 2022-02-24 DIAGNOSIS — M25551 Pain in right hip: Secondary | ICD-10-CM

## 2022-02-24 DIAGNOSIS — R739 Hyperglycemia, unspecified: Secondary | ICD-10-CM | POA: Diagnosis not present

## 2022-02-24 DIAGNOSIS — F419 Anxiety disorder, unspecified: Secondary | ICD-10-CM

## 2022-02-24 DIAGNOSIS — M545 Low back pain, unspecified: Secondary | ICD-10-CM

## 2022-02-24 DIAGNOSIS — M542 Cervicalgia: Secondary | ICD-10-CM

## 2022-02-24 DIAGNOSIS — M160 Bilateral primary osteoarthritis of hip: Secondary | ICD-10-CM | POA: Diagnosis not present

## 2022-02-24 DIAGNOSIS — R030 Elevated blood-pressure reading, without diagnosis of hypertension: Secondary | ICD-10-CM | POA: Diagnosis not present

## 2022-02-24 DIAGNOSIS — M19041 Primary osteoarthritis, right hand: Secondary | ICD-10-CM

## 2022-02-24 DIAGNOSIS — M4316 Spondylolisthesis, lumbar region: Secondary | ICD-10-CM | POA: Diagnosis not present

## 2022-02-24 DIAGNOSIS — I700 Atherosclerosis of aorta: Secondary | ICD-10-CM | POA: Diagnosis not present

## 2022-02-24 DIAGNOSIS — M1712 Unilateral primary osteoarthritis, left knee: Secondary | ICD-10-CM | POA: Diagnosis not present

## 2022-02-24 DIAGNOSIS — M2550 Pain in unspecified joint: Secondary | ICD-10-CM

## 2022-02-24 DIAGNOSIS — M5136 Other intervertebral disc degeneration, lumbar region: Secondary | ICD-10-CM | POA: Diagnosis not present

## 2022-02-24 DIAGNOSIS — Z01818 Encounter for other preprocedural examination: Secondary | ICD-10-CM

## 2022-02-24 DIAGNOSIS — E7849 Other hyperlipidemia: Secondary | ICD-10-CM | POA: Diagnosis not present

## 2022-02-24 DIAGNOSIS — F32A Depression, unspecified: Secondary | ICD-10-CM

## 2022-02-24 DIAGNOSIS — M8589 Other specified disorders of bone density and structure, multiple sites: Secondary | ICD-10-CM | POA: Diagnosis not present

## 2022-02-24 DIAGNOSIS — M19042 Primary osteoarthritis, left hand: Secondary | ICD-10-CM

## 2022-02-24 DIAGNOSIS — E559 Vitamin D deficiency, unspecified: Secondary | ICD-10-CM

## 2022-02-24 LAB — CBC WITH DIFFERENTIAL/PLATELET
Basophils Absolute: 0.1 10*3/uL (ref 0.0–0.1)
Basophils Relative: 1.1 % (ref 0.0–3.0)
Eosinophils Absolute: 0.2 10*3/uL (ref 0.0–0.7)
Eosinophils Relative: 3.2 % (ref 0.0–5.0)
HCT: 41.2 % (ref 36.0–46.0)
Hemoglobin: 13.6 g/dL (ref 12.0–15.0)
Lymphocytes Relative: 23.1 % (ref 12.0–46.0)
Lymphs Abs: 1.2 10*3/uL (ref 0.7–4.0)
MCHC: 33.1 g/dL (ref 30.0–36.0)
MCV: 90.9 fl (ref 78.0–100.0)
Monocytes Absolute: 0.4 10*3/uL (ref 0.1–1.0)
Monocytes Relative: 8.4 % (ref 3.0–12.0)
Neutro Abs: 3.4 10*3/uL (ref 1.4–7.7)
Neutrophils Relative %: 64.2 % (ref 43.0–77.0)
Platelets: 219 10*3/uL (ref 150.0–400.0)
RBC: 4.53 Mil/uL (ref 3.87–5.11)
RDW: 13 % (ref 11.5–15.5)
WBC: 5.3 10*3/uL (ref 4.0–10.5)

## 2022-02-24 LAB — LIPID PANEL
Cholesterol: 191 mg/dL (ref 0–200)
HDL: 89 mg/dL (ref >39.00)
LDL Cholesterol: 91 mg/dL (ref 0–99)
NonHDL: 102.43
Total CHOL/HDL Ratio: 2
Triglycerides: 58 mg/dL (ref 0.0–149.0)
VLDL: 11.6 mg/dL (ref 0.0–40.0)

## 2022-02-24 LAB — SEDIMENTATION RATE: Sed Rate: 13 mm/hr (ref 0–30)

## 2022-02-24 LAB — APTT: aPTT: 26.3 s (ref 23.4–32.7)

## 2022-02-24 LAB — COMPREHENSIVE METABOLIC PANEL
ALT: 20 U/L (ref 0–35)
AST: 27 U/L (ref 0–37)
Albumin: 4.8 g/dL (ref 3.5–5.2)
Alkaline Phosphatase: 56 U/L (ref 39–117)
BUN: 28 mg/dL — ABNORMAL HIGH (ref 6–23)
CO2: 29 mEq/L (ref 19–32)
Calcium: 10.3 mg/dL (ref 8.4–10.5)
Chloride: 104 mEq/L (ref 96–112)
Creatinine, Ser: 1.05 mg/dL (ref 0.40–1.20)
GFR: 51.74 mL/min — ABNORMAL LOW (ref >60.00)
Glucose, Bld: 95 mg/dL (ref 70–99)
Potassium: 4.2 mEq/L (ref 3.5–5.1)
Sodium: 140 mEq/L (ref 135–145)
Total Bilirubin: 0.7 mg/dL (ref 0.2–1.2)
Total Protein: 7.2 g/dL (ref 6.0–8.3)

## 2022-02-24 LAB — HEMOGLOBIN A1C: Hgb A1c MFr Bld: 5.9 % (ref 4.6–6.5)

## 2022-02-24 LAB — PROTIME-INR
INR: 1 ratio (ref 0.8–1.0)
Prothrombin Time: 11.3 s (ref 9.6–13.1)

## 2022-02-24 LAB — TSH: TSH: 2.59 u[IU]/mL (ref 0.35–5.50)

## 2022-02-24 LAB — VITAMIN D 25 HYDROXY (VIT D DEFICIENCY, FRACTURES): VITD: 44.61 ng/mL (ref 30.00–100.00)

## 2022-02-24 MED ORDER — ALPRAZOLAM 0.25 MG PO TABS
0.2500 mg | ORAL_TABLET | Freq: Every evening | ORAL | 1 refills | Status: DC | PRN
  Filled 2022-02-24: qty 30, 30d supply, fill #0

## 2022-02-24 MED ORDER — BUPROPION HCL ER (XL) 150 MG PO TB24
150.0000 mg | ORAL_TABLET | Freq: Every day | ORAL | 3 refills | Status: DC
  Filled 2022-02-24: qty 90, 90d supply, fill #0
  Filled 2022-05-19: qty 90, 90d supply, fill #1
  Filled 2022-08-14: qty 90, 90d supply, fill #2
  Filled 2022-12-02: qty 90, 90d supply, fill #3

## 2022-02-24 NOTE — Assessment & Plan Note (Addendum)
Chronic ?Diffuse-hands, knees, hips-possible all osteoarthritis ?We will get x-rays to evaluate severity ?Refer to Ortho to discuss hand osteoarthritis ?ESR ?

## 2022-02-24 NOTE — Assessment & Plan Note (Signed)
History of left-sided breast cancer ?S/p bilateral mastectomy 1995 ?No evidence of recurrence ?

## 2022-02-24 NOTE — Assessment & Plan Note (Signed)
Chronic medical problems stable and she is low risk for surgery ?EKG done today shows a sinus bradycardia at 54 bpm with premature atrial complexes, otherwise normal EKG.  Compared to prior EKGs there is no change. ?PTT, PT, INR ? ?

## 2022-02-24 NOTE — Assessment & Plan Note (Signed)
Chronic ?Controlled ?Uses alprazolam only for flying and traveling-refilled ?Restart bupropion 150 mg daily ?

## 2022-02-24 NOTE — Assessment & Plan Note (Signed)
Chronic Taking vitamin D daily Check vitamin D level  

## 2022-02-24 NOTE — Assessment & Plan Note (Addendum)
Chronic ?Continue Crestor 10 mg daily-LDL close to goal ?Continue regular exercise, healthy diet ?

## 2022-02-24 NOTE — Assessment & Plan Note (Signed)
Chronic neck pain ?Has done physical therapy and does a periodically ?Sees chiropractor ?Continue diclofenac as needed, can continue tramadol as needed ?

## 2022-02-24 NOTE — Assessment & Plan Note (Signed)
Chronic Regular exercise and healthy diet encouraged Check lipid panel  Continue Crestor 10 mg daily 

## 2022-02-24 NOTE — Assessment & Plan Note (Signed)
Chronic Check a1c Low sugar / carb diet Continue regular exercise 

## 2022-02-24 NOTE — Assessment & Plan Note (Signed)
Chronic ?DEXA up-to-date ?Exercising regularly ?Taking vitamin D daily ?

## 2022-02-24 NOTE — Assessment & Plan Note (Signed)
Blood pressure often elevated at the doctor's office, but very well controlled at home ?Continue HCTZ 12.5 mg daily ?Monitor BP at home ?

## 2022-03-08 ENCOUNTER — Other Ambulatory Visit (HOSPITAL_COMMUNITY): Payer: Self-pay

## 2022-03-08 ENCOUNTER — Other Ambulatory Visit: Payer: Self-pay | Admitting: Internal Medicine

## 2022-03-08 MED ORDER — DICLOFENAC SODIUM 75 MG PO TBEC
75.0000 mg | DELAYED_RELEASE_TABLET | Freq: Two times a day (BID) | ORAL | 3 refills | Status: DC | PRN
  Filled 2022-03-08: qty 180, 90d supply, fill #0
  Filled 2022-09-28: qty 180, 90d supply, fill #1
  Filled 2023-02-20: qty 180, 90d supply, fill #2

## 2022-03-30 ENCOUNTER — Other Ambulatory Visit (HOSPITAL_COMMUNITY): Payer: Self-pay

## 2022-04-04 ENCOUNTER — Other Ambulatory Visit (HOSPITAL_COMMUNITY): Payer: Self-pay

## 2022-04-04 MED ORDER — CEPHALEXIN 500 MG PO CAPS
500.0000 mg | ORAL_CAPSULE | Freq: Three times a day (TID) | ORAL | 1 refills | Status: DC
  Filled 2022-04-04: qty 20, 7d supply, fill #0

## 2022-04-26 ENCOUNTER — Other Ambulatory Visit (HOSPITAL_COMMUNITY): Payer: Self-pay

## 2022-04-26 MED ORDER — TRAMADOL HCL 50 MG PO TABS
50.0000 mg | ORAL_TABLET | Freq: Four times a day (QID) | ORAL | 0 refills | Status: DC | PRN
  Filled 2022-04-26: qty 15, 2d supply, fill #0

## 2022-04-27 ENCOUNTER — Other Ambulatory Visit (HOSPITAL_COMMUNITY): Payer: Self-pay

## 2022-04-27 DIAGNOSIS — H52223 Regular astigmatism, bilateral: Secondary | ICD-10-CM | POA: Diagnosis not present

## 2022-04-27 DIAGNOSIS — H524 Presbyopia: Secondary | ICD-10-CM | POA: Diagnosis not present

## 2022-04-27 DIAGNOSIS — H5213 Myopia, bilateral: Secondary | ICD-10-CM | POA: Diagnosis not present

## 2022-04-27 DIAGNOSIS — H16142 Punctate keratitis, left eye: Secondary | ICD-10-CM | POA: Diagnosis not present

## 2022-04-27 MED ORDER — REFRESH LACRI-LUBE OP OINT
TOPICAL_OINTMENT | OPHTHALMIC | 0 refills | Status: DC
  Filled 2022-04-27: qty 7, 15d supply, fill #0

## 2022-04-27 MED ORDER — POLYMYXIN B-TRIMETHOPRIM 10000-0.1 UNIT/ML-% OP SOLN
1.0000 [drp] | Freq: Two times a day (BID) | OPHTHALMIC | 0 refills | Status: DC
  Filled 2022-04-27: qty 10, 50d supply, fill #0

## 2022-05-04 ENCOUNTER — Encounter: Payer: Self-pay | Admitting: Internal Medicine

## 2022-05-05 ENCOUNTER — Telehealth: Payer: Self-pay | Admitting: Internal Medicine

## 2022-05-05 DIAGNOSIS — I351 Nonrheumatic aortic (valve) insufficiency: Secondary | ICD-10-CM

## 2022-05-05 NOTE — Addendum Note (Signed)
Addended by: Precious Gilding on: 05/05/2022 04:01 PM   Modules accepted: Orders

## 2022-05-05 NOTE — Telephone Encounter (Signed)
Dr Delfin Edis needs an echo scheduled to follow up aortic insufficiency   Hope in next several weeks     I can work her into my schedule after

## 2022-05-05 NOTE — Telephone Encounter (Signed)
Called pt advised of MD recommendation for an Echocardiogram.  Pt scheduled for 05/11/22 at 2 PM.

## 2022-05-11 ENCOUNTER — Ambulatory Visit (HOSPITAL_COMMUNITY): Payer: PPO | Attending: Cardiology

## 2022-05-11 DIAGNOSIS — I351 Nonrheumatic aortic (valve) insufficiency: Secondary | ICD-10-CM | POA: Diagnosis not present

## 2022-05-11 LAB — ECHOCARDIOGRAM COMPLETE
Area-P 1/2: 2.62 cm2
P 1/2 time: 773 msec
S' Lateral: 2.7 cm

## 2022-05-12 ENCOUNTER — Telehealth: Payer: Self-pay | Admitting: Internal Medicine

## 2022-05-12 DIAGNOSIS — E785 Hyperlipidemia, unspecified: Secondary | ICD-10-CM

## 2022-05-12 DIAGNOSIS — Z79899 Other long term (current) drug therapy: Secondary | ICD-10-CM

## 2022-05-12 NOTE — Telephone Encounter (Signed)
Set patient up for lipomed panel tomorrow  She will come in fasting

## 2022-05-13 ENCOUNTER — Other Ambulatory Visit: Payer: PPO

## 2022-05-13 DIAGNOSIS — E785 Hyperlipidemia, unspecified: Secondary | ICD-10-CM | POA: Diagnosis not present

## 2022-05-13 DIAGNOSIS — Z79899 Other long term (current) drug therapy: Secondary | ICD-10-CM

## 2022-05-14 LAB — NMR, LIPOPROFILE
Cholesterol, Total: 202 mg/dL — ABNORMAL HIGH (ref 100–199)
HDL Particle Number: 37.7 umol/L (ref >=30.5)
HDL-C: 102 mg/dL (ref >39)
LDL Particle Number: 866 nmol/L (ref <1000)
LDL Size: 21.3 nm (ref >20.5)
LDL-C (NIH Calc): 85 mg/dL (ref 0–99)
LP-IR Score: <25 (ref <=45)
Small LDL Particle Number: <90 nmol/L (ref <=527)
Triglycerides: 84 mg/dL (ref 0–149)

## 2022-05-15 ENCOUNTER — Other Ambulatory Visit: Payer: Self-pay | Admitting: Internal Medicine

## 2022-05-17 ENCOUNTER — Other Ambulatory Visit (HOSPITAL_COMMUNITY): Payer: Self-pay

## 2022-05-17 MED ORDER — HYDROCHLOROTHIAZIDE 12.5 MG PO CAPS
12.5000 mg | ORAL_CAPSULE | Freq: Every day | ORAL | 0 refills | Status: DC
  Filled 2022-05-17 – 2022-07-04 (×2): qty 90, 90d supply, fill #0

## 2022-05-18 ENCOUNTER — Other Ambulatory Visit (HOSPITAL_COMMUNITY): Payer: Self-pay

## 2022-05-18 MED ORDER — EZETIMIBE 10 MG PO TABS
10.0000 mg | ORAL_TABLET | Freq: Every day | ORAL | 3 refills | Status: DC
  Filled 2022-05-18: qty 90, 90d supply, fill #0
  Filled 2022-09-28: qty 90, 90d supply, fill #1
  Filled 2023-05-15: qty 90, 90d supply, fill #2

## 2022-05-18 NOTE — Addendum Note (Signed)
Addended by: Stephani Police on: 05/18/2022 08:16 AM   Modules accepted: Orders

## 2022-05-19 ENCOUNTER — Other Ambulatory Visit (HOSPITAL_COMMUNITY): Payer: Self-pay

## 2022-05-19 DIAGNOSIS — M180 Bilateral primary osteoarthritis of first carpometacarpal joints: Secondary | ICD-10-CM | POA: Diagnosis not present

## 2022-05-19 DIAGNOSIS — M189 Osteoarthritis of first carpometacarpal joint, unspecified: Secondary | ICD-10-CM | POA: Diagnosis not present

## 2022-05-20 NOTE — Telephone Encounter (Signed)
Error

## 2022-07-04 ENCOUNTER — Other Ambulatory Visit (HOSPITAL_COMMUNITY): Payer: Self-pay

## 2022-07-13 ENCOUNTER — Other Ambulatory Visit: Payer: Self-pay | Admitting: Internal Medicine

## 2022-07-14 ENCOUNTER — Other Ambulatory Visit (HOSPITAL_COMMUNITY): Payer: Self-pay

## 2022-07-14 MED ORDER — ROSUVASTATIN CALCIUM 10 MG PO TABS
10.0000 mg | ORAL_TABLET | Freq: Every day | ORAL | 0 refills | Status: DC
  Filled 2022-07-14: qty 30, 30d supply, fill #0

## 2022-07-15 ENCOUNTER — Encounter: Payer: Self-pay | Admitting: Internal Medicine

## 2022-07-15 DIAGNOSIS — M25532 Pain in left wrist: Secondary | ICD-10-CM | POA: Diagnosis not present

## 2022-07-16 MED ORDER — ROSUVASTATIN CALCIUM 10 MG PO TABS
10.0000 mg | ORAL_TABLET | Freq: Every day | ORAL | 1 refills | Status: DC
  Filled 2022-07-16 – 2022-08-07 (×2): qty 90, 90d supply, fill #0
  Filled 2022-11-15: qty 90, 90d supply, fill #1

## 2022-07-18 ENCOUNTER — Other Ambulatory Visit (HOSPITAL_COMMUNITY): Payer: Self-pay

## 2022-07-20 DIAGNOSIS — M25532 Pain in left wrist: Secondary | ICD-10-CM | POA: Diagnosis not present

## 2022-07-20 DIAGNOSIS — S52509A Unspecified fracture of the lower end of unspecified radius, initial encounter for closed fracture: Secondary | ICD-10-CM | POA: Insufficient documentation

## 2022-07-20 DIAGNOSIS — S52502A Unspecified fracture of the lower end of left radius, initial encounter for closed fracture: Secondary | ICD-10-CM | POA: Diagnosis not present

## 2022-07-28 DIAGNOSIS — S52502A Unspecified fracture of the lower end of left radius, initial encounter for closed fracture: Secondary | ICD-10-CM | POA: Diagnosis not present

## 2022-07-28 DIAGNOSIS — M25532 Pain in left wrist: Secondary | ICD-10-CM | POA: Diagnosis not present

## 2022-07-28 DIAGNOSIS — M189 Osteoarthritis of first carpometacarpal joint, unspecified: Secondary | ICD-10-CM | POA: Diagnosis not present

## 2022-07-29 ENCOUNTER — Ambulatory Visit: Payer: PPO | Attending: Internal Medicine

## 2022-07-29 DIAGNOSIS — Z79899 Other long term (current) drug therapy: Secondary | ICD-10-CM

## 2022-07-29 DIAGNOSIS — E785 Hyperlipidemia, unspecified: Secondary | ICD-10-CM

## 2022-07-30 LAB — NMR, LIPOPROFILE
Cholesterol, Total: 175 mg/dL (ref 100–199)
HDL Particle Number: 33 umol/L (ref >=30.5)
HDL-C: 90 mg/dL (ref >39)
LDL Particle Number: 686 nmol/L (ref <1000)
LDL Size: 21.1 nm (ref >20.5)
LDL-C (NIH Calc): 70 mg/dL (ref 0–99)
LP-IR Score: <25 (ref <=45)
Small LDL Particle Number: <90 nmol/L (ref <=527)
Triglycerides: 83 mg/dL (ref 0–149)

## 2022-07-30 LAB — HEPATIC FUNCTION PANEL
ALT: 15 IU/L (ref 0–32)
AST: 18 IU/L (ref 0–40)
Albumin: 4.7 g/dL (ref 3.8–4.8)
Alkaline Phosphatase: 79 IU/L (ref 44–121)
Bilirubin Total: 0.4 mg/dL (ref 0.0–1.2)
Bilirubin, Direct: 0.15 mg/dL (ref 0.00–0.40)
Total Protein: 6.6 g/dL (ref 6.0–8.5)

## 2022-08-05 ENCOUNTER — Other Ambulatory Visit: Payer: PPO

## 2022-08-08 ENCOUNTER — Other Ambulatory Visit (HOSPITAL_COMMUNITY): Payer: Self-pay

## 2022-08-15 ENCOUNTER — Other Ambulatory Visit (HOSPITAL_COMMUNITY): Payer: Self-pay

## 2022-08-18 DIAGNOSIS — M25532 Pain in left wrist: Secondary | ICD-10-CM | POA: Diagnosis not present

## 2022-08-18 DIAGNOSIS — M189 Osteoarthritis of first carpometacarpal joint, unspecified: Secondary | ICD-10-CM | POA: Diagnosis not present

## 2022-08-18 DIAGNOSIS — S52502D Unspecified fracture of the lower end of left radius, subsequent encounter for closed fracture with routine healing: Secondary | ICD-10-CM | POA: Diagnosis not present

## 2022-08-19 ENCOUNTER — Encounter: Payer: Self-pay | Admitting: Internal Medicine

## 2022-09-03 ENCOUNTER — Other Ambulatory Visit: Payer: Self-pay | Admitting: Internal Medicine

## 2022-09-05 ENCOUNTER — Other Ambulatory Visit (HOSPITAL_COMMUNITY): Payer: Self-pay

## 2022-09-05 DIAGNOSIS — S52502D Unspecified fracture of the lower end of left radius, subsequent encounter for closed fracture with routine healing: Secondary | ICD-10-CM | POA: Diagnosis not present

## 2022-09-05 DIAGNOSIS — M1812 Unilateral primary osteoarthritis of first carpometacarpal joint, left hand: Secondary | ICD-10-CM | POA: Diagnosis not present

## 2022-09-05 MED ORDER — HYDROCHLOROTHIAZIDE 12.5 MG PO CAPS
12.5000 mg | ORAL_CAPSULE | Freq: Every day | ORAL | 0 refills | Status: DC
  Filled 2022-09-05 – 2022-09-28 (×2): qty 15, 15d supply, fill #0

## 2022-09-28 ENCOUNTER — Telehealth: Payer: Self-pay | Admitting: Internal Medicine

## 2022-09-28 ENCOUNTER — Other Ambulatory Visit (HOSPITAL_COMMUNITY): Payer: Self-pay

## 2022-09-28 MED ORDER — HYDROCHLOROTHIAZIDE 12.5 MG PO CAPS
12.5000 mg | ORAL_CAPSULE | Freq: Every day | ORAL | 0 refills | Status: DC
  Filled 2022-09-28 – 2022-10-20 (×2): qty 90, 90d supply, fill #0

## 2022-09-28 NOTE — Telephone Encounter (Signed)
*  STAT* If patient is at the pharmacy, call can be transferred to refill team.   1. Which medications need to be refilled? (please list name of each medication and dose if known) hydrochlorothiazide (MICROZIDE) 12.5 MG capsule   2. Which pharmacy/location (including street and city if local pharmacy) is medication to be sent to? [place]   3. Do they need a 30 day or 90 day supply? 90 day   Patient has an appointment 11/23/2022 with Dr. Harrington Challenger.

## 2022-09-28 NOTE — Telephone Encounter (Signed)
Pt's medication was sent to pt's pharmacy as requested. Confirmation received.  °

## 2022-10-20 ENCOUNTER — Other Ambulatory Visit (HOSPITAL_COMMUNITY): Payer: Self-pay

## 2022-11-15 ENCOUNTER — Other Ambulatory Visit (HOSPITAL_COMMUNITY): Payer: Self-pay

## 2022-11-22 NOTE — Progress Notes (Unsigned)
Cardiology Office Note   Date:  11/23/2022   ID:  Valerie Dragon, Valerie Phelps, DOB 1946-01-15, MRN [id number]  PCP:  [name], Valerie Phelps  Cardiologist:   [name], Valerie Phelps   Pt presents for continued follow up of Aortic Insufficiency  and CAD   History of Present Illness: Valerie Dragon, Valerie Phelps is a 77 y.o. female with a history of a functionally bicuspid AV with aortic insufficiency   She also has a hx of CAD (Ca score of  61 in 2011;  257 in 2021; aortic atherosclerosis noted ),  In addition she has a hx of  PACs, PVCs, bradycardia   The patient was last in clinic in 2022   Since then she remains very active   Denies SOB  No CP   Occasional dizziness     No palpitations      Current Meds  Medication Sig   ALPRAZolam (XANAX) 0.25 MG tablet Take 1 tablet (0.25 mg total) by mouth at bedtime as needed for anxiety.   buPROPion (WELLBUTRIN XL) 150 MG 24 hr tablet Take 1 tablet (150 mg total) by mouth daily.   D2000 ULTRA STRENGTH 50 MCG (2000 UT) CAPS SMARTSIG:1 By Mouth   diclofenac (VOLTAREN) 75 MG EC tablet Take 1 tablet (75 mg total) by mouth 2 (two) times daily as needed for mild pain.   ezetimibe (ZETIA) 10 MG tablet Take 1 tablet (10 mg total) by mouth daily.   hydrochlorothiazide (MICROZIDE) 12.5 MG capsule Take 1 capsule (12.5 mg total) by mouth daily.   traMADol (ULTRAM) 50 MG tablet Take 1 tablet (50 mg total) by mouth every 6 (six) hours as needed.   White Petrolatum-Mineral Oil (REFRESH LACRI-LUBE) OINT Apply  a small amount into affected eye as directed as needed   [DISCONTINUED] rosuvastatin (CRESTOR) 10 MG tablet Take 1 tablet (10 mg total) by mouth daily.     Allergies:   Patient has no known allergies.   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    Past Surgical History:  Procedure Laterality Date   BREAST RECONSTRUCTION  1997-2013   implants exchanged   BREAST SURGERY  1995   rt and lt total mastectomies-reconstruction   COLONOSCOPY     FACIAL COSMETIC SURGERY     LYMPH NODE BIOPSY     MOUTH SURGERY     OOPHORECTOMY  1982   rt   Varicose vein injections       Social History:  The patient  reports that she has never smoked. She has  never used smokeless tobacco. She reports current alcohol use of about 5.0 standard drinks of alcohol per week. She reports that she does not use drugs.   Family History:  The patient's family history includes Aneurysm (age of onset: 75) in her brother; Breast cancer (age of onset: 97) in her paternal grandmother; Breast cancer (age of onset: 43) in her maternal aunt; Colon cancer in her paternal grandfather; Osteoarthritis in her sister; Prostate cancer in her father.    ROS:  Please see the history of present illness. All other systems are reviewed and  Negative to the above problem except as noted.    PHYSICAL EXAM: VS:  BP 128/72   Pulse 62   Ht '5\' 4"'$  (1.626 m)   Wt 128 lb 3.2 oz (58.2 kg)   SpO2 96%   BMI 22.01 kg/m     GEN: Well nourished, well developed, in no acute distress  HEENT: normal  Neck: no JVD, carotid bruits, Cardiac: RRR; Gr I/VI systolicmurmur LSB   No diastolic    No LE  edema  Respiratory:  clear to auscultation bilaterally GI: soft, nontender, nondistended, + BS  No hepatomegaly  MS: no deformity Moving all extremities   Skin: warm and dry, no rash Neuro:  Strength and sensation are intact Psych: euthymic mood, full affect   EKG:  EKG is not ordered today    Echo   June 2023  eft ventricular ejection fraction, by estimation, is 60 to 65%. The left ventricle has normal function. The left ventricle has no regional wall motion abnormalities. Left ventricular diastolic parameters are indeterminate. 1. Right ventricular systolic function is normal. The right ventricular size is normal. There is normal pulmonary artery systolic pressure. 2. 3. Left atrial size was severely dilated. 4. Right atrial size was moderately dilated. The mitral valve is grossly normal. Trivial mitral valve regurgitation. No evidence of mitral stenosis. 5. The aortic valve is bicuspid. There is moderate calcification of the aortic valve. Aortic valve regurgitation is moderate. Aortic valve sclerosis/calcification is present, without any evidence of aortic stenosis. 6. The inferior vena cava is normal in size with greater than 50% respiratory variability, suggesting right atrial pressure of 3 mmHg. 7. Comparison(s): No significant change from prior study. Prior images reviewed side by side.  Lipid Panel    Component Value Date/Time   CHOL 191 02/24/2022 1114   CHOL 193 07/30/2020 0941   CHOL 187 07/18/2016 0734   TRIG 58.0 02/24/2022 1114   TRIG 57 07/31/2017 0748   HDL 89.00 02/24/2022 1114   HDL 86 07/30/2020 0941   HDL 101 07/31/2017 0748   CHOLHDL 2 02/24/2022 1114   VLDL 11.6 02/24/2022 1114   LDLCALC 91 02/24/2022 1114   LDLCALC 93 07/30/2020 0941   LDLCALC 70 07/18/2016 0734      Wt Readings from Last 3 Encounters:  11/23/22 128 lb 3.2 oz (58.2 kg)  02/24/22 125 lb 12.8 oz (57.1 kg)  06/16/21 126 lb 9.6 oz (57.4 kg)      ASSESSMENT AND  PLAN:  1   Aortic valve dz   pt with funcitonally bicuspid AV    AI remains moderate   Recomm echo next fall    2  HTN   BP readings from home are overall very good   systolic is   237S to 283T mostly   Keep on same meds   3  CAD    No symptoms to sugg angina   Keep on asprin  4  HL  will check lipids       5  hx PACs, PVCs   PT denies significant symtpoms     Follow for now   With biatrial enlargement is at risk for afib     6   Metabolics   K0X wsa 5.9 in April   Will repeat   Discussed diet    Follow up next fall     Current medicines are reviewed at length with the patient today.  The patient does not have concerns regarding medicines.  Signed, [name], Valerie Phelps  11/23/2022 11:07 PM    [address], [place], [place]  [address] Phone: [phone]; Fax: [phone]

## 2022-11-23 ENCOUNTER — Encounter: Payer: Self-pay | Admitting: Internal Medicine

## 2022-11-23 ENCOUNTER — Other Ambulatory Visit (HOSPITAL_COMMUNITY): Payer: Self-pay

## 2022-11-23 ENCOUNTER — Ambulatory Visit: Payer: PPO | Attending: Internal Medicine | Admitting: Internal Medicine

## 2022-11-23 VITALS — BP 128/72 | HR 62 | Ht 64.0 in | Wt 128.2 lb

## 2022-11-23 DIAGNOSIS — I351 Nonrheumatic aortic (valve) insufficiency: Secondary | ICD-10-CM | POA: Diagnosis not present

## 2022-11-23 DIAGNOSIS — Z79899 Other long term (current) drug therapy: Secondary | ICD-10-CM

## 2022-11-23 DIAGNOSIS — E785 Hyperlipidemia, unspecified: Secondary | ICD-10-CM | POA: Diagnosis not present

## 2022-11-23 MED ORDER — ROSUVASTATIN CALCIUM 10 MG PO TABS
10.0000 mg | ORAL_TABLET | Freq: Every day | ORAL | 3 refills | Status: DC
  Filled 2022-11-23 – 2023-02-20 (×2): qty 90, 90d supply, fill #0
  Filled 2023-05-15: qty 90, 90d supply, fill #1
  Filled 2023-08-15: qty 90, 90d supply, fill #2
  Filled 2023-11-08: qty 90, 90d supply, fill #3

## 2022-11-23 NOTE — Patient Instructions (Signed)
Medication Instructions:   *If you need a refill on your cardiac medications before your next appointment, please call your pharmacy*   Lab Work: NMR, [place] PCP  If you have labs (blood work) drawn today and your tests are completely normal, you will receive your results only by: [place] (if you have MyChart) OR A paper copy in the mail If you have any lab test that is abnormal or we need to change your treatment, we will call you to review the results.   Testing/Procedures: IN SEPT 2024 WITH APPT WITH DR ROSS AFTER  Your physician has requested that you have an echocardiogram. Echocardiography is a painless test that uses sound waves to create images of your heart. It provides your doctor with information about the size and shape of your heart and how well your heart's chambers and valves are working. This procedure takes approximately one hour. There are no restrictions for this procedure. Please do NOT wear cologne, perfume, aftershave, or lotions (deodorant is allowed). Please arrive 15 minutes prior to your appointment time.    Follow-Up: At [facility], you and your health needs are our priority.  As part of our continuing mission to provide you with exceptional heart care, we have created designated Provider Care Teams.  These Care Teams include your primary Cardiologist (physician) and Advanced Practice Providers (APPs -  Physician Assistants and Nurse Practitioners) who all work together to provide you with the care you need, when you need it.  We recommend signing up for the patient portal called "MyChart".  Sign up information is provided on this After Visit Summary.  MyChart is used to connect with patients for Virtual Visits (Telemedicine).  Patients are able to view lab/test results, encounter notes, upcoming appointments, etc.  Non-urgent messages can be sent to your provider as well.   To learn more about what you can do with MyChart, go to  [email or web address].    Your next appointment:   7 month(s)  The format for your next appointment:   DR PAULA ROSS     Other Instructions   Important Information About Sugar

## 2023-01-09 NOTE — Patient Instructions (Signed)
Valerie Phelps , Thank you for taking time to come for your Medicare Wellness Visit. I appreciate your ongoing commitment to your health goals. Please review the following plan we discussed and let me know if I can assist you in the future.   These are the goals we discussed:  Goals   None     This is a list of the screening recommended for you and due dates:  Health Maintenance  Topic Date Due   Medicare Annual Wellness Visit  Never done   DTaP/Tdap/Td vaccine (1 - Tdap) Never done   COVID-19 Vaccine (5 - 2023-24 season) 10/08/2022   DEXA scan (bone density measurement)  08/14/2023   Pneumonia Vaccine  Completed   Flu Shot  Completed   Hepatitis C Screening: USPSTF Recommendation to screen - Ages 72-79 yo.  Completed   Zoster (Shingles) Vaccine  Completed   HPV Vaccine  Aged Out   Colon Cancer Screening  Discontinued    Advanced directives: Please bring a copy of your health care power of attorney and living will to the office to be added to your chart at your convenience.   Conditions/risks identified: Aim for 30 minutes of exercise or brisk walking, 6-8 glasses of water, and 5 servings of fruits and vegetables each day.   Next appointment: Follow up in one year for your annual wellness visit    Preventive Care 65 Years and Older, Female Preventive care refers to lifestyle choices and visits with your health care provider that can promote health and wellness. What does preventive care include? A yearly physical exam. This is also called an annual well check. Dental exams once or twice a year. Routine eye exams. Ask your health care provider how often you should have your eyes checked. Personal lifestyle choices, including: Daily care of your teeth and gums. Regular physical activity. Eating a healthy diet. Avoiding tobacco and drug use. Limiting alcohol use. Practicing safe sex. Taking low-dose aspirin every day. Taking vitamin and mineral supplements as recommended by your  health care provider. What happens during an annual well check? The services and screenings done by your health care provider during your annual well check will depend on your age, overall health, lifestyle risk factors, and family history of disease. Counseling  Your health care provider may ask you questions about your: Alcohol use. Tobacco use. Drug use. Emotional well-being. Home and relationship well-being. Sexual activity. Eating habits. History of falls. Memory and ability to understand (cognition). Work and work Statistician. Reproductive health. Screening  You may have the following tests or measurements: Height, weight, and BMI. Blood pressure. Lipid and cholesterol levels. These may be checked every 5 years, or more frequently if you are over 49 years old. Skin check. Lung cancer screening. You may have this screening every year starting at age 61 if you have a 30-pack-year history of smoking and currently smoke or have quit within the past 15 years. Fecal occult blood test (FOBT) of the stool. You may have this test every year starting at age 22. Flexible sigmoidoscopy or colonoscopy. You may have a sigmoidoscopy every 5 years or a colonoscopy every 10 years starting at age 62. Hepatitis C blood test. Hepatitis B blood test. Sexually transmitted disease (STD) testing. Diabetes screening. This is done by checking your blood sugar (glucose) after you have not eaten for a while (fasting). You may have this done every 1-3 years. Bone density scan. This is done to screen for osteoporosis. You may have this done  starting at age 78. Mammogram. This may be done every 1-2 years. Talk to your health care provider about how often you should have regular mammograms. Talk with your health care provider about your test results, treatment options, and if necessary, the need for more tests. Vaccines  Your health care provider may recommend certain vaccines, such as: Influenza vaccine.  This is recommended every year. Tetanus, diphtheria, and acellular pertussis (Tdap, Td) vaccine. You may need a Td booster every 10 years. Zoster vaccine. You may need this after age 13. Pneumococcal 13-valent conjugate (PCV13) vaccine. One dose is recommended after age 73. Pneumococcal polysaccharide (PPSV23) vaccine. One dose is recommended after age 79. Talk to your health care provider about which screenings and vaccines you need and how often you need them. This information is not intended to replace advice given to you by your health care provider. Make sure you discuss any questions you have with your health care provider. Document Released: 12/04/2015 Document Revised: 07/27/2016 Document Reviewed: 09/08/2015 Elsevier Interactive Patient Education  2017 [place] Prevention in the Home Falls can cause injuries. They can happen to people of all ages. There are many things you can do to make your home safe and to help prevent falls. What can I do on the outside of my home? Regularly fix the edges of walkways and driveways and fix any cracks. Remove anything that might make you trip as you walk through a door, such as a raised step or threshold. Trim any bushes or trees on the path to your home. Use bright outdoor lighting. Clear any walking paths of anything that might make someone trip, such as rocks or tools. Regularly check to see if handrails are loose or broken. Make sure that both sides of any steps have handrails. Any raised decks and porches should have guardrails on the edges. Have any leaves, snow, or ice cleared regularly. Use sand or salt on walking paths during winter. Clean up any spills in your garage right away. This includes oil or grease spills. What can I do in the bathroom? Use night lights. Install grab bars by the toilet and in the tub and shower. Do not use towel bars as grab bars. Use non-skid mats or decals in the tub or shower. If you need to sit  down in the shower, use a plastic, non-slip stool. Keep the floor dry. Clean up any water that spills on the floor as soon as it happens. Remove soap buildup in the tub or shower regularly. Attach bath mats securely with double-sided non-slip rug tape. Do not have throw rugs and other things on the floor that can make you trip. What can I do in the bedroom? Use night lights. Make sure that you have a light by your bed that is easy to reach. Do not use any sheets or blankets that are too big for your bed. They should not hang down onto the floor. Have a firm chair that has side arms. You can use this for support while you get dressed. Do not have throw rugs and other things on the floor that can make you trip. What can I do in the kitchen? Clean up any spills right away. Avoid walking on wet floors. Keep items that you use a lot in easy-to-reach places. If you need to reach something above you, use a strong step stool that has a grab bar. Keep electrical cords out of the way. Do not use floor polish or wax that  makes floors slippery. If you must use wax, use non-skid floor wax. Do not have throw rugs and other things on the floor that can make you trip. What can I do with my stairs? Do not leave any items on the stairs. Make sure that there are handrails on both sides of the stairs and use them. Fix handrails that are broken or loose. Make sure that handrails are as long as the stairways. Check any carpeting to make sure that it is firmly attached to the stairs. Fix any carpet that is loose or worn. Avoid having throw rugs at the top or bottom of the stairs. If you do have throw rugs, attach them to the floor with carpet tape. Make sure that you have a light switch at the top of the stairs and the bottom of the stairs. If you do not have them, ask someone to add them for you. What else can I do to help prevent falls? Wear shoes that: Do not have high heels. Have rubber bottoms. Are  comfortable and fit you well. Are closed at the toe. Do not wear sandals. If you use a stepladder: Make sure that it is fully opened. Do not climb a closed stepladder. Make sure that both sides of the stepladder are locked into place. Ask someone to hold it for you, if possible. Clearly mark and make sure that you can see: Any grab bars or handrails. First and last steps. Where the edge of each step is. Use tools that help you move around (mobility aids) if they are needed. These include: Canes. Walkers. Scooters. Crutches. Turn on the lights when you go into a dark area. Replace any light bulbs as soon as they burn out. Set up your furniture so you have a clear path. Avoid moving your furniture around. If any of your floors are uneven, fix them. If there are any pets around you, be aware of where they are. Review your medicines with your doctor. Some medicines can make you feel dizzy. This can increase your chance of falling. Ask your doctor what other things that you can do to help prevent falls. This information is not intended to replace advice given to you by your health care provider. Make sure you discuss any questions you have with your health care provider. Document Released: 09/03/2009 Document Revised: 04/14/2016 Document Reviewed: 12/12/2014 Elsevier Interactive Patient Education  2017 [employer].

## 2023-01-09 NOTE — Progress Notes (Unsigned)
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  Review of Systems    ***       Objective:    There were no vitals filed for this visit. There is no height or weight on file to calculate BMI.     03/10/2016    2:36 PM 03/10/2016    2:35 PM 03/20/2014   10:40 AM 03/27/2012    1:29 PM  Advanced Directives  Does Patient Have a Medical Advance Directive? No No Patient has advance directive, copy not in chart Patient has advance directive, copy not in chart  Type of Advance Directive   [name];Living will [place]  Would patient like information on creating a medical advance directive? No - patient declined information No - patient declined information    Pre-existing out of facility DNR order (yellow form or pink MOST form)   No     Current Medications (verified) Outpatient Encounter Medications as of 01/10/2023  Medication Sig   ALPRAZolam (XANAX) 0.25 MG tablet Take 1 tablet (0.25 mg total) by mouth at bedtime as needed for anxiety.   buPROPion (WELLBUTRIN XL) 150 MG 24 hr tablet Take 1 tablet (150 mg total) by mouth daily.   D2000 ULTRA STRENGTH 50 MCG (2000 UT) CAPS SMARTSIG:1 By Mouth   diclofenac (VOLTAREN) 75 MG EC tablet Take 1 tablet (75 mg total) by mouth 2 (two) times daily as needed for mild pain.   ezetimibe (ZETIA) 10 MG tablet Take 1 tablet (10 mg total) by mouth daily.   hydrochlorothiazide (MICROZIDE) 12.5 MG capsule Take 1 capsule (12.5 mg total) by mouth daily.   rosuvastatin (CRESTOR) 10 MG tablet Take 1 tablet (10 mg total) by mouth daily.   traMADol (ULTRAM) 50 MG tablet Take 1 tablet (50 mg total) by mouth every 6 (six) hours as needed.   White Petrolatum-Mineral Oil (REFRESH LACRI-LUBE) OINT Apply  a small amount into affected eye as directed as needed   No facility-administered encounter medications on file as of 01/10/2023.    Allergies  (verified) Patient has no known allergies.   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   Past Surgical History:  Procedure Laterality Date   BREAST RECONSTRUCTION  1997-2013   implants exchanged   BREAST SURGERY  1995   rt and lt total mastectomies-reconstruction   COLONOSCOPY     FACIAL COSMETIC SURGERY     LYMPH NODE BIOPSY  MOUTH SURGERY     OOPHORECTOMY  1982   rt   Varicose vein injections     Family History  Problem Relation Age of Onset   Prostate cancer Father    Breast cancer Maternal Aunt 45   Breast cancer Paternal Grandmother 69   Colon cancer Paternal Grandfather        rectal   Osteoarthritis Sister    Aneurysm Brother 58       brain, no intervention   Social History   Socioeconomic History   Marital status: Married    Spouse name: Bruce   Number of children: 3   Years of education: 25   Highest education level: Not on file  Occupational History   Occupation: physician/GI    Employer: [employer]    Comment: [employer] down [employer]  Tobacco Use   Smoking status: Never   Smokeless tobacco: Never  Substance and  Sexual Activity   Alcohol use: Yes    Alcohol/week: 5.0 standard drinks of alcohol    Types: 5 drink(s) per week    Comment: occ   Drug use: No   Sexual activity: Yes    Partners: Male    Birth control/protection: Post-menopausal  Other Topics Concern   Not on file  Social History Narrative   Valerie Phelps, [place]. Married - '71- 7 years/divorced; Married '86. 1 son - '85; 2 step-children. 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 ACP/end of life - full resiscitation, reasonable duration mechanical ventilatory support, no heroic/futile measures. HCPOA - spouse   Social Determinants of Radio broadcast assistant Strain: Not on file  Food Insecurity: Not on file  Transportation Needs: Not on file  Physical Activity: Not on file  Stress: Not on file  Social Connections: Not on file    Tobacco Counseling Counseling given: Not Answered   Clinical Intake:                 Diabetic?No          Activities of Daily Living     No data to display          Patient Care Team: [name], MD as PCP - General (Internal Medicine) Marica Otter, [place] (Optometry) Stefanie Libel, MD (Sports Medicine) Fay Records, MD as Consulting Physician (Cardiology)  Indicate any recent Medical Services you may have received from other than [facility] providers in the past year (date may be approximate).     Assessment:   This is a routine wellness examination for Valerie Phelps.  Hearing/Vision screen No results found.  Dietary issues and exercise activities discussed:     Goals Addressed   None    Depression Screen    02/22/2021   12:40 PM 02/22/2021    8:37 AM 02/20/2020    8:08 AM 12/21/2018    8:29 AM 03/10/2016    2:36 PM 07/16/2015    8:45 AM  PHQ 2/9 Scores  PHQ - 2 Score 0  0 0 0 0  PHQ- 9 Score 0   0    Exception Documentation  Patient refusal   Other- indicate reason in comment box     Fall Risk    02/22/2021    8:32 AM  02/20/2020    8:07 AM 12/21/2018    8:29 AM 03/10/2016    2:36 PM 07/16/2015    8:45 AM  Fall Risk   Falls in the past year? 0 0 0 No No  Number  falls in past yr: 0 0     Injury with Fall? 0 0     Risk for fall due to : No Fall Risks   Other (Comment)   Follow up Falls evaluation completed        [place]:  Any stairs in or around the home? {YES/NO:21197} If so, are there any without handrails? {YES/NO:21197} Home free of loose throw rugs in walkways, pet beds, electrical cords, etc? {YES/NO:21197} Adequate lighting in your home to reduce risk of falls? {YES/NO:21197}  ASSISTIVE DEVICES UTILIZED TO PREVENT FALLS:  Life alert? {YES/NO:21197} Use of a cane, walker or w/c? {YES/NO:21197} Grab bars in the bathroom? {YES/NO:21197} Shower chair or bench in shower? {YES/NO:21197} Elevated toilet seat or a handicapped toilet? {YES/NO:21197}  TIMED UP AND GO:  Was the test performed? No . Telephonic visit   Cognitive Function:        Immunizations Immunization History  Administered Date(s) Administered   Fluad Quad(high Dose 65+) 07/20/2019, 08/17/2020, 07/30/2021   Influenza, High Dose Seasonal PF 07/16/2015, 07/22/2016, 08/07/2017, 09/11/2018, 07/30/2021, 07/22/2022   Influenza,inj,quad, With Preservative 07/24/2017, 07/22/2018   Influenza-Unspecified 07/22/2012   Moderna SARS-COV2 Booster Vaccination 07/30/2021   PFIZER(Purple Top)SARS-COV-2 Vaccination 12/07/2019, 12/28/2019, 07/21/2020   Pfizer Covid-19 Vaccine Bivalent Booster 65yr & up 08/13/2022   Pneumococcal Conjugate-13 07/16/2015   Pneumococcal Polysaccharide-23 09/01/2009, 07/14/2014   Respiratory Syncytial Virus Vaccine,Recomb Aduvanted(Arexvy) 08/13/2022   Zoster Recombinat (Shingrix) 01/05/2018, 03/12/2018   Zoster, Live 10/07/2013    {TDAP status:2101805}  Flu Vaccine status: Up to date  Pneumococcal vaccine status: Up to date  Covid-19 vaccine status: Information  provided on how to obtain vaccines.   Qualifies for Shingles Vaccine? Yes   Zostavax completed No   Shingrix Completed?: Yes  Screening Tests Health Maintenance  Topic Date Due   Medicare Annual Wellness (AWV)  Never done   DTaP/Tdap/Td (1 - Tdap) Never done   COVID-19 Vaccine (5 - 2023-24 season) 10/08/2022   DEXA SCAN  08/14/2023   Pneumonia Vaccine 77 Years old  Completed   INFLUENZA VACCINE  Completed   Hepatitis C Screening  Completed   Zoster Vaccines- Shingrix  Completed   HPV VACCINES  Aged Out   COLONOSCOPY (Pts [phone] coverage will need to be confirmed)  Discontinued    Health Maintenance  Health Maintenance Due  Topic Date Due   Medicare Annual Wellness (AWV)  Never done   DTaP/Tdap/Td (1 - Tdap) Never done   COVID-19 Vaccine (5 - 2023-24 season) 10/08/2022    Colorectal cancer screening: No longer required.   Mammogram status: No longer required due to age.  Bone Density status: Completed 08/13/21. Results reflect: Bone density results: OSTEOPENIA. Repeat every 2 years.  Lung Cancer Screening: (Low Dose CT Chest recommended if Age 77-80ears, 30 pack-year currently smoking OR have quit w/in 15years.) does not qualify.   Lung Cancer Screening Referral: n/a  Additional Screening:  Hepatitis C Screening: does qualify; Completed 07/16/15  Vision Screening: Recommended annual ophthalmology exams for early detection of glaucoma and other disorders of the eye. Is the patient up to date with their annual eye exam?  {YES/NO:21197} Who is the provider or what is the name of the office in which the patient attends annual eye exams? *** If pt is not established with a provider, would they like to be referred to a provider to establish care? {YES/NO:21197}.   Dental Screening: Recommended annual dental exams for proper oral hygiene  Community  Resource Referral / Chronic Care Management: CRR required this visit?  {YES/NO:21197}  CCM required this visit?   {YES/NO:21197}     Plan:     I have personally reviewed and noted the following in the patient's chart:   Medical and social history Use of alcohol, tobacco or illicit drugs  Current medications and supplements including opioid prescriptions. {Opioid Prescriptions:[phone]} Functional ability and status Nutritional status Physical activity Advanced directives List of other physicians Hospitalizations, surgeries, and ER visits in previous 12 months Vitals Screenings to include cognitive, depression, and falls Referrals and appointments  In addition, I have reviewed and discussed with patient certain preventive protocols, quality metrics, and best practice recommendations. A written personalized care plan for preventive services as well as general preventive health recommendations were provided to patient.     Vanetta Mulders, [place]   [id number]   Due to this being a virtual visit, the after visit summary with patients personalized plan was offered to patient via mail or my-chart. ***Patient declined at this time./ Patient would like to access on my-chart/ per request, patient was mailed a copy of AVS./ Patient preferred to pick up at office at next visit   Nurse Notes: ***

## 2023-01-10 ENCOUNTER — Ambulatory Visit (INDEPENDENT_AMBULATORY_CARE_PROVIDER_SITE_OTHER): Payer: PPO

## 2023-01-10 VITALS — Ht 64.0 in | Wt 125.0 lb

## 2023-01-10 DIAGNOSIS — Z0000 Encounter for general adult medical examination without abnormal findings: Secondary | ICD-10-CM

## 2023-01-19 ENCOUNTER — Encounter: Payer: Self-pay | Admitting: Internal Medicine

## 2023-01-20 ENCOUNTER — Other Ambulatory Visit: Payer: Self-pay | Admitting: Internal Medicine

## 2023-01-20 ENCOUNTER — Other Ambulatory Visit (HOSPITAL_COMMUNITY): Payer: Self-pay

## 2023-01-20 MED ORDER — HYDROCHLOROTHIAZIDE 12.5 MG PO CAPS
12.5000 mg | ORAL_CAPSULE | Freq: Every day | ORAL | 2 refills | Status: DC
  Filled 2023-01-20: qty 90, 90d supply, fill #0
  Filled 2023-04-21: qty 90, 90d supply, fill #1
  Filled 2023-07-13: qty 90, 90d supply, fill #2

## 2023-02-20 ENCOUNTER — Other Ambulatory Visit: Payer: Self-pay | Admitting: Internal Medicine

## 2023-02-20 ENCOUNTER — Other Ambulatory Visit (HOSPITAL_COMMUNITY): Payer: Self-pay

## 2023-02-21 ENCOUNTER — Other Ambulatory Visit (HOSPITAL_COMMUNITY): Payer: Self-pay

## 2023-02-21 ENCOUNTER — Other Ambulatory Visit: Payer: Self-pay

## 2023-02-21 MED ORDER — TRAMADOL HCL 50 MG PO TABS
50.0000 mg | ORAL_TABLET | Freq: Four times a day (QID) | ORAL | 3 refills | Status: DC | PRN
  Filled 2023-02-21: qty 60, 15d supply, fill #0

## 2023-02-21 MED ORDER — ALPRAZOLAM 0.25 MG PO TABS
0.2500 mg | ORAL_TABLET | Freq: Every evening | ORAL | 1 refills | Status: DC | PRN
  Filled 2023-02-21: qty 30, 30d supply, fill #0

## 2023-02-23 ENCOUNTER — Other Ambulatory Visit: Payer: Self-pay | Admitting: Internal Medicine

## 2023-02-23 ENCOUNTER — Other Ambulatory Visit (HOSPITAL_COMMUNITY): Payer: Self-pay

## 2023-02-23 MED ORDER — BUPROPION HCL ER (XL) 150 MG PO TB24
150.0000 mg | ORAL_TABLET | Freq: Every day | ORAL | 3 refills | Status: DC
  Filled 2023-02-23: qty 90, 90d supply, fill #0
  Filled 2023-05-26: qty 90, 90d supply, fill #1
  Filled 2023-09-03: qty 90, 90d supply, fill #2
  Filled 2023-12-04: qty 90, 90d supply, fill #3

## 2023-03-01 ENCOUNTER — Encounter: Payer: Self-pay | Admitting: Internal Medicine

## 2023-03-01 NOTE — Progress Notes (Unsigned)
Subjective:    Patient ID: Valerie Carwin, MD, female    DOB: 1946/05/31, 77 y.o.   MRN: [id number]      HPI Valerie Phelps is here for a Physical exam and her chronic medical problems.   Overall doing well and has no concerns.  Has some concerns about her memory.  She does notice some changes and is not sure if it is age-related or more than that.  She is doing everything she can do to help prevent memory issues.   Medications and allergies reviewed with patient and updated if appropriate.  Current Outpatient Medications on File Prior to Visit  Medication Sig Dispense Refill   ALPRAZolam (XANAX) 0.25 MG tablet Take 1 tablet (0.25 mg total) by mouth at bedtime as needed for anxiety. 30 tablet 1   buPROPion (WELLBUTRIN XL) 150 MG 24 hr tablet Take 1 tablet (150 mg total) by mouth daily. 90 tablet 3   Cholecalciferol 100 MCG (4000 UT) CAPS Take by mouth.     diclofenac (VOLTAREN) 75 MG EC tablet Take 1 tablet (75 mg total) by mouth 2 (two) times daily as needed for mild pain. 180 tablet 3   ezetimibe (ZETIA) 10 MG tablet Take 1 tablet (10 mg total) by mouth daily. 90 tablet 3   hydrochlorothiazide (MICROZIDE) 12.5 MG capsule Take 1 capsule (12.5 mg total) by mouth daily. 90 capsule 2   rosuvastatin (CRESTOR) 10 MG tablet Take 1 tablet (10 mg total) by mouth daily. 90 tablet 3   traMADol (ULTRAM) 50 MG tablet Take 1 tablet (50 mg total) by mouth every 6 (six) hours as needed. 60 tablet 3   No current facility-administered medications on file prior to visit.    Review of Systems  Constitutional:  Negative for fever.       Hot flashes - stopped HRT  Eyes:  Negative for visual disturbance.  Respiratory:  Negative for cough, shortness of breath and wheezing.   Cardiovascular:  Negative for chest pain, palpitations and leg swelling.  Gastrointestinal:  Negative for abdominal pain, blood in stool, constipation and diarrhea.       No gerd  Genitourinary:  Negative for dysuria.   Musculoskeletal:  Positive for arthralgias and neck pain. Negative for back pain.  Skin:  Negative for rash.  Neurological:  Positive for headaches (from neck). Negative for light-headedness and numbness.  Psychiatric/Behavioral:  Negative for dysphoric mood and sleep disturbance. The patient is not nervous/anxious.        Objective:   Vitals:   03/02/23 1016  BP: 122/78  Pulse: (!) 55  Temp: 98.3 F (36.8 C)  SpO2: 97%   Filed Weights   03/02/23 1016  Weight: 126 lb (57.2 kg)   Body mass index is 21.63 kg/m.  BP Readings from Last 3 Encounters:  03/02/23 122/78  11/23/22 128/72  02/24/22 130/68    Wt Readings from Last 3 Encounters:  03/02/23 126 lb (57.2 kg)  01/10/23 125 lb (56.7 kg)  11/23/22 128 lb 3.2 oz (58.2 kg)       Physical Exam Constitutional: She appears well-developed and well-nourished. No distress.  HENT:  Head: Normocephalic and atraumatic.  Right Ear: External ear normal. Normal ear canal and TM Left Ear: External ear normal.  Normal ear canal and TM Mouth/Throat: Oropharynx is clear and moist.  Eyes: Conjunctivae normal.  Neck: Neck supple. No tracheal deviation present. No thyromegaly present.  No carotid bruit  Cardiovascular: Normal rate, regular rhythm and normal heart sounds.  No murmur heard.  No edema. Pulmonary/Chest: Effort normal and breath sounds normal. No respiratory distress. She has no wheezes. She has no rales.  Breast: deferred   Abdominal: Soft. She exhibits no distension. There is no tenderness.  Lymphadenopathy: She has no cervical adenopathy.  Skin: Skin is warm and dry. She is not diaphoretic.  Psychiatric: She has a normal mood and affect. Her behavior is normal.     Lab Results  Component Value Date   WBC 5.3 02/24/2022   HGB 13.6 02/24/2022   HCT 41.2 02/24/2022   PLT 219.0 02/24/2022   GLUCOSE 95 02/24/2022   CHOL 191 02/24/2022   TRIG 58.0 02/24/2022   HDL 89.00 02/24/2022   LDLCALC 91 02/24/2022    ALT 15 07/29/2022   AST 18 07/29/2022   NA 140 02/24/2022   K 4.2 02/24/2022   CL 104 02/24/2022   CREATININE 1.05 02/24/2022   BUN 28 (H) 02/24/2022   CO2 29 02/24/2022   TSH 2.59 02/24/2022   INR 1.0 02/24/2022   HGBA1C 5.9 02/24/2022         Assessment & Plan:   Physical exam: Screening blood work  ordered Exercise regular Weight normal Substance abuse  none   Reviewed recommended immunizations.   Health Maintenance  Topic Date Due   DTaP/Tdap/Td (1 - Tdap) Never done   COVID-19 Vaccine (5 - 2023-24 season) 05/21/2023 (Originally 10/08/2022)   INFLUENZA VACCINE  06/22/2023   DEXA SCAN  08/14/2023   Medicare Annual Wellness (AWV)  01/11/2024   Pneumonia Vaccine 46+ Years old  Completed   Hepatitis C Screening  Completed   Zoster Vaccines- Shingrix  Completed   HPV VACCINES  Aged Out   COLONOSCOPY (Pts 45-50yrs Insurance coverage will need to be confirmed)  Discontinued      She will let me know if she wants to be referred for neuropsychology evaluation of memory    See Problem List for Assessment and Plan of chronic medical problems.

## 2023-03-01 NOTE — Patient Instructions (Addendum)
Blood work was ordered.   The lab is on the first floor.    Medications changes include :   None    A referral was ordered for Dr Carlis Abbott at [facility].  [phone].    Return in about 1 year (around 03/01/2024) for Physical Exam, Schedule DEXA-[facility]  for Sept..    Health Maintenance, Female Adopting a healthy lifestyle and getting preventive care are important in promoting health and wellness. Ask your health care provider about: The right schedule for you to have regular tests and exams. Things you can do on your own to prevent diseases and keep yourself healthy. What should I know about diet, weight, and exercise? Eat a healthy diet  Eat a diet that includes plenty of vegetables, fruits, low-fat dairy products, and lean protein. Do not eat a lot of foods that are high in solid fats, added sugars, or sodium. Maintain a healthy weight Body mass index (BMI) is used to identify weight problems. It estimates body fat based on height and weight. Your health care provider can help determine your BMI and help you achieve or maintain a healthy weight. Get regular exercise Get regular exercise. This is one of the most important things you can do for your health. Most adults should: Exercise for at least 150 minutes each week. The exercise should increase your heart rate and make you sweat (moderate-intensity exercise). Do strengthening exercises at least twice a week. This is in addition to the moderate-intensity exercise. Spend less time sitting. Even light physical activity can be beneficial. Watch cholesterol and blood lipids Have your blood tested for lipids and cholesterol at 77 years of age, then have this test every 5 years. Have your cholesterol levels checked more often if: Your lipid or cholesterol levels are high. You are older than 77 years of age. You are at high risk for heart disease. What should I know about cancer screening? Depending on your  health history and family history, you may need to have cancer screening at various ages. This may include screening for: Breast cancer. Cervical cancer. Colorectal cancer. Skin cancer. Lung cancer. What should I know about heart disease, diabetes, and high blood pressure? Blood pressure and heart disease High blood pressure causes heart disease and increases the risk of stroke. This is more likely to develop in people who have high blood pressure readings or are overweight. Have your blood pressure checked: Every 3-5 years if you are 77 years of age. Every year if you are 49 years old or older. Diabetes Have regular diabetes screenings. This checks your fasting blood sugar level. Have the screening done: Once every three years after age 77 if you are at a normal weight and have a low risk for diabetes. More often and at a younger age if you are overweight or have a high risk for diabetes. What should I know about preventing infection? Hepatitis B If you have a higher risk for hepatitis B, you should be screened for this virus. Talk with your health care provider to find out if you are at risk for hepatitis B infection. Hepatitis C Testing is recommended for: Everyone born from [birth year] through 1965. Anyone with known risk factors for hepatitis C. Sexually transmitted infections (STIs) Get screened for STIs, including gonorrhea and chlamydia, if: You are sexually active and are younger than 77 years of age. You are older than 77 years of age and your health care provider tells you that you  are at risk for this type of infection. Your sexual activity has changed since you were last screened, and you are at increased risk for chlamydia or gonorrhea. Ask your health care provider if you are at risk. Ask your health care provider about whether you are at high risk for HIV. Your health care provider may recommend a prescription medicine to help prevent HIV infection. If you choose to take  medicine to prevent HIV, you should first get tested for HIV. You should then be tested every 3 months for as long as you are taking the medicine. Pregnancy If you are about to stop having your period (premenopausal) and you may become pregnant, seek counseling before you get pregnant. Take 400 to 800 micrograms (mcg) of folic acid every day if you become pregnant. Ask for birth control (contraception) if you want to prevent pregnancy. Osteoporosis and menopause Osteoporosis is a disease in which the bones lose minerals and strength with aging. This can result in bone fractures. If you are 77 years old or older, or if you are at risk for osteoporosis and fractures, ask your health care provider if you should: Be screened for bone loss. Take a calcium or vitamin D supplement to lower your risk of fractures. Be given hormone replacement therapy (HRT) to treat symptoms of menopause. Follow these instructions at home: Alcohol use Do not drink alcohol if: Your health care provider tells you not to drink. You are pregnant, may be pregnant, or are planning to become pregnant. If you drink alcohol: Limit how much you have to: 0-1 drink a day. Know how much alcohol is in your drink. In the [place] one drink equals one 12 oz bottle of beer (355 mL), one 5 oz glass of wine (148 mL), or one 1 oz glass of hard liquor (44 mL). Lifestyle Do not use any products that contain nicotine or tobacco. These products include cigarettes, chewing tobacco, and vaping devices, such as e-cigarettes. If you need help quitting, ask your health care provider. Do not use street drugs. Do not share needles. Ask your health care provider for help if you need support or information about quitting drugs. General instructions Schedule regular health, dental, and eye exams. Stay current with your vaccines. Tell your health care provider if: You often feel depressed. You have ever been abused or do not feel safe at  home. Summary Adopting a healthy lifestyle and getting preventive care are important in promoting health and wellness. Follow your health care provider's instructions about healthy diet, exercising, and getting tested or screened for diseases. Follow your health care provider's instructions on monitoring your cholesterol and blood pressure. This information is not intended to replace advice given to you by your health care provider. Make sure you discuss any questions you have with your health care provider. Document Revised: 03/29/2021 Document Reviewed: 03/29/2021 Elsevier Patient Education  2023 [employer].

## 2023-03-02 ENCOUNTER — Encounter: Payer: Self-pay | Admitting: Internal Medicine

## 2023-03-02 ENCOUNTER — Ambulatory Visit (INDEPENDENT_AMBULATORY_CARE_PROVIDER_SITE_OTHER): Payer: PPO | Admitting: Internal Medicine

## 2023-03-02 VITALS — BP 122/78 | HR 55 | Temp 98.3°F | Ht 64.0 in | Wt 126.0 lb

## 2023-03-02 DIAGNOSIS — F32A Depression, unspecified: Secondary | ICD-10-CM | POA: Diagnosis not present

## 2023-03-02 DIAGNOSIS — R739 Hyperglycemia, unspecified: Secondary | ICD-10-CM

## 2023-03-02 DIAGNOSIS — I351 Nonrheumatic aortic (valve) insufficiency: Secondary | ICD-10-CM

## 2023-03-02 DIAGNOSIS — M8589 Other specified disorders of bone density and structure, multiple sites: Secondary | ICD-10-CM | POA: Diagnosis not present

## 2023-03-02 DIAGNOSIS — R7303 Prediabetes: Secondary | ICD-10-CM | POA: Diagnosis not present

## 2023-03-02 DIAGNOSIS — M542 Cervicalgia: Secondary | ICD-10-CM

## 2023-03-02 DIAGNOSIS — F419 Anxiety disorder, unspecified: Secondary | ICD-10-CM

## 2023-03-02 DIAGNOSIS — E559 Vitamin D deficiency, unspecified: Secondary | ICD-10-CM

## 2023-03-02 DIAGNOSIS — E7849 Other hyperlipidemia: Secondary | ICD-10-CM

## 2023-03-02 DIAGNOSIS — I700 Atherosclerosis of aorta: Secondary | ICD-10-CM | POA: Diagnosis not present

## 2023-03-02 DIAGNOSIS — Z0000 Encounter for general adult medical examination without abnormal findings: Secondary | ICD-10-CM | POA: Diagnosis not present

## 2023-03-02 DIAGNOSIS — Q231 Congenital insufficiency of aortic valve: Secondary | ICD-10-CM

## 2023-03-02 LAB — COMPREHENSIVE METABOLIC PANEL
ALT: 24 U/L (ref 0–35)
AST: 24 U/L (ref 0–37)
Albumin: 4.5 g/dL (ref 3.5–5.2)
Alkaline Phosphatase: 79 U/L (ref 39–117)
BUN: 26 mg/dL — ABNORMAL HIGH (ref 6–23)
CO2: 30 mEq/L (ref 19–32)
Calcium: 10.9 mg/dL — ABNORMAL HIGH (ref 8.4–10.5)
Chloride: 103 mEq/L (ref 96–112)
Creatinine, Ser: 1.02 mg/dL (ref 0.40–1.20)
GFR: 53.19 mL/min — ABNORMAL LOW (ref >60.00)
Glucose, Bld: 88 mg/dL (ref 70–99)
Potassium: 4.7 mEq/L (ref 3.5–5.1)
Sodium: 140 mEq/L (ref 135–145)
Total Bilirubin: 0.6 mg/dL (ref 0.2–1.2)
Total Protein: 6.9 g/dL (ref 6.0–8.3)

## 2023-03-02 LAB — VITAMIN D 25 HYDROXY (VIT D DEFICIENCY, FRACTURES): VITD: 62.97 ng/mL (ref 30.00–100.00)

## 2023-03-02 LAB — CBC WITH DIFFERENTIAL/PLATELET
Basophils Absolute: 0.1 10*3/uL (ref 0.0–0.1)
Basophils Relative: 1.2 % (ref 0.0–3.0)
Eosinophils Absolute: 0.2 10*3/uL (ref 0.0–0.7)
Eosinophils Relative: 4.6 % (ref 0.0–5.0)
HCT: 39.7 % (ref 36.0–46.0)
Hemoglobin: 13.1 g/dL (ref 12.0–15.0)
Lymphocytes Relative: 30.2 % (ref 12.0–46.0)
Lymphs Abs: 1.5 10*3/uL (ref 0.7–4.0)
MCHC: 32.9 g/dL (ref 30.0–36.0)
MCV: 89.2 fl (ref 78.0–100.0)
Monocytes Absolute: 0.4 10*3/uL (ref 0.1–1.0)
Monocytes Relative: 8.5 % (ref 3.0–12.0)
Neutro Abs: 2.7 10*3/uL (ref 1.4–7.7)
Neutrophils Relative %: 55.5 % (ref 43.0–77.0)
Platelets: 241 10*3/uL (ref 150.0–400.0)
RBC: 4.45 Mil/uL (ref 3.87–5.11)
RDW: 13.2 % (ref 11.5–15.5)
WBC: 4.9 10*3/uL (ref 4.0–10.5)

## 2023-03-02 LAB — SEDIMENTATION RATE: Sed Rate: 15 mm/hr (ref 0–30)

## 2023-03-02 LAB — HEMOGLOBIN A1C: Hgb A1c MFr Bld: 6.1 % (ref 4.6–6.5)

## 2023-03-02 LAB — LIPID PANEL
Cholesterol: 168 mg/dL (ref 0–200)
HDL: 81 mg/dL (ref >39.00)
LDL Cholesterol: 72 mg/dL (ref 0–99)
NonHDL: 86.82
Total CHOL/HDL Ratio: 2
Triglycerides: 73 mg/dL (ref 0.0–149.0)
VLDL: 14.6 mg/dL (ref 0.0–40.0)

## 2023-03-02 LAB — VITAMIN B12: Vitamin B-12: 373 pg/mL (ref 211–911)

## 2023-03-02 LAB — TSH: TSH: 3.2 u[IU]/mL (ref 0.35–5.50)

## 2023-03-02 NOTE — Assessment & Plan Note (Signed)
Chronic Controlled Uses alprazolam only for flying and traveling Continue bupropion XL 150 mg daily

## 2023-03-02 NOTE — Assessment & Plan Note (Signed)
Chronic Regular exercise and healthy diet encouraged Check lipid panel, CMP, TSH Continue Crestor 10 mg daily, Zetia 10 mg daily

## 2023-03-02 NOTE — Assessment & Plan Note (Signed)
Chronic Monitored by Dr. Tenny Craw

## 2023-03-02 NOTE — Assessment & Plan Note (Signed)
Chronic Sees chiropractor Has done physical therapy and does exercises for her neck Continue diclofenac as needed, tramadol as needed

## 2023-03-02 NOTE — Assessment & Plan Note (Signed)
Chronic Asymptomatic Monitored by Dr. Tenny Craw

## 2023-03-02 NOTE — Assessment & Plan Note (Signed)
Chronic Taking vitamin D daily Check vitamin D level  

## 2023-03-02 NOTE — Assessment & Plan Note (Signed)
Chronic DEXA up-to-date-due this fall She is exercising regularly Continue calcium and vitamin D intake Check vitamin D level

## 2023-03-02 NOTE — Assessment & Plan Note (Signed)
Chronic Check a1c Low sugar / carb diet Continue regular exercise   

## 2023-03-02 NOTE — Assessment & Plan Note (Signed)
Chronic Continue Crestor 10 mg daily, Zetia 10 mg daily Continue regular exercise, healthy diet Lipid panel, CMP

## 2023-03-03 ENCOUNTER — Encounter: Payer: Self-pay | Admitting: Physical Medicine and Rehabilitation

## 2023-03-03 LAB — NMR, LIPOPROFILE
Cholesterol, Total: 171 mg/dL (ref 100–199)
HDL Particle Number: 36.8 umol/L (ref >=30.5)
HDL-C: 87 mg/dL (ref >39)
LDL Particle Number: 726 nmol/L (ref <1000)
LDL Size: 21.1 nm (ref >20.5)
LDL-C (NIH Calc): 70 mg/dL (ref 0–99)
LP-IR Score: <25 (ref <=45)
Small LDL Particle Number: <90 nmol/L (ref <=527)
Triglycerides: 76 mg/dL (ref 0–149)

## 2023-03-23 ENCOUNTER — Other Ambulatory Visit (HOSPITAL_COMMUNITY): Payer: Self-pay

## 2023-03-23 DIAGNOSIS — L570 Actinic keratosis: Secondary | ICD-10-CM | POA: Diagnosis not present

## 2023-03-23 DIAGNOSIS — Z85828 Personal history of other malignant neoplasm of skin: Secondary | ICD-10-CM | POA: Diagnosis not present

## 2023-03-23 DIAGNOSIS — L820 Inflamed seborrheic keratosis: Secondary | ICD-10-CM | POA: Diagnosis not present

## 2023-03-23 DIAGNOSIS — L821 Other seborrheic keratosis: Secondary | ICD-10-CM | POA: Diagnosis not present

## 2023-03-23 DIAGNOSIS — D1801 Hemangioma of skin and subcutaneous tissue: Secondary | ICD-10-CM | POA: Diagnosis not present

## 2023-03-23 DIAGNOSIS — L578 Other skin changes due to chronic exposure to nonionizing radiation: Secondary | ICD-10-CM | POA: Diagnosis not present

## 2023-03-23 MED ORDER — TRETINOIN 0.05 % EX CREA
1.0000 | TOPICAL_CREAM | Freq: Every evening | CUTANEOUS | 3 refills | Status: DC
  Filled 2023-03-23: qty 45, 30d supply, fill #0

## 2023-03-24 ENCOUNTER — Other Ambulatory Visit (HOSPITAL_COMMUNITY): Payer: Self-pay

## 2023-03-31 ENCOUNTER — Encounter: Payer: Self-pay | Admitting: Internal Medicine

## 2023-04-01 ENCOUNTER — Encounter: Payer: Self-pay | Admitting: Internal Medicine

## 2023-04-06 ENCOUNTER — Encounter: Payer: PPO | Attending: Physical Medicine and Rehabilitation | Admitting: Physical Medicine and Rehabilitation

## 2023-04-06 ENCOUNTER — Encounter: Payer: Self-pay | Admitting: Physical Medicine and Rehabilitation

## 2023-04-06 VITALS — BP 122/74 | HR 59 | Ht 64.0 in | Wt 126.0 lb

## 2023-04-06 DIAGNOSIS — M12811 Other specific arthropathies, not elsewhere classified, right shoulder: Secondary | ICD-10-CM | POA: Diagnosis not present

## 2023-04-06 DIAGNOSIS — M75101 Unspecified rotator cuff tear or rupture of right shoulder, not specified as traumatic: Secondary | ICD-10-CM | POA: Diagnosis not present

## 2023-04-06 DIAGNOSIS — M542 Cervicalgia: Secondary | ICD-10-CM | POA: Diagnosis not present

## 2023-04-06 DIAGNOSIS — M12812 Other specific arthropathies, not elsewhere classified, left shoulder: Secondary | ICD-10-CM | POA: Diagnosis not present

## 2023-04-06 DIAGNOSIS — M75102 Unspecified rotator cuff tear or rupture of left shoulder, not specified as traumatic: Secondary | ICD-10-CM | POA: Diagnosis not present

## 2023-04-06 NOTE — Progress Notes (Addendum)
Subjective:    Patient ID: Valerie Carwin, MD, female    DOB: 1946-04-30, 77 y.o.   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  1) Cervical spine pain: -does exercises religiously -she would like guidance as she feels her neck is moving forward -she went to therapy for 3 years and received a HEP -none of her exercises cause pain -she does hand weights and does shoulder press- it does not hurt when she does it but later in the day it hurts -denies neuropathy  2) s/p bilateral rotator cuff tear: -fell -she is a runner -feels ache in right arm  Pain Inventory Average Pain 3 Pain Right Now 0 My pain is intermittent, burning, and dull  In the last 24 hours, has pain interfered with the following? General activity 0 Relation with others 0 Enjoyment of life 0 What TIME of day is your pain at its worst? night Sleep (in general) Fair  Pain is worse with: sitting and some activites Pain improves with:  Sleeping sitting up Relief from Meds: 7  walk without assistance ability to climb steps?  yes do you drive?  yes  retired Do you have any goals in this area?  yes  No problems in this area  See My Chart for Surgeries  Any changes since last visit?  no    Family History  Problem Relation Age of Onset   Prostate cancer Father    Breast cancer Maternal Aunt 45   Breast cancer Paternal Grandmother 79   Colon cancer Paternal Grandfather        rectal   Osteoarthritis Sister    Aneurysm Brother 72       brain, no intervention   Social History   Socioeconomic History   Marital status: Married    Spouse name: Bruce   Number of children: 3   Years of education: 25   Highest education level: Not on file  Occupational History   Occupation: physician/GI    Employer: [employer]    Comment: [employer]  Tobacco Use   Smoking status: Never   Smokeless tobacco: Never  Vaping Use   Vaping Use:  Former  Substance and Sexual Activity   Alcohol use: Yes    Alcohol/week: 5.0 standard drinks of alcohol    Types: 5 drink(s) per week    Comment: occ   Drug use: No   Sexual activity: Yes    Partners: Male    Birth control/protection: Post-menopausal  Other Topics Concern   Not on file  Social History Narrative   [employer]ana [employer], [employer], [employer]. Married - '71- 7 years/divorced; Married '86. 1 son - '85; 2 step-children. work - [employer]. No history of abuse. ACP/end of life - full resiscitation, reasonable duration mechanical ventilatory support, no heroic/futile measures. HCPOA - spouse   Social Determinants of Health   Financial Resource Strain: Low Risk  (01/10/2023)   Overall Financial Resource Strain (CARDIA)    Difficulty of Paying Living Expenses: Not hard at all  Food Insecurity: No Food Insecurity (01/10/2023)   Hunger Vital Sign    Worried About Running Out of Food in the Last Year: Never true    Ran Out of Food in the Last Year: Never true  Transportation Needs: No Transportation Needs (01/10/2023)   PRAPARE - Transportation    Lack of Transportation (Medical): No    Lack  of Transportation (Non-Medical): No  Physical Activity: Sufficiently Active (01/10/2023)   Exercise Vital Sign    Days of Exercise per Week: 5 days    Minutes of Exercise per Session: 30 min  Stress: No Stress Concern Present (01/10/2023)   [employer] of Occupational Health - Occupational Stress Questionnaire    Feeling of Stress : Not at all  Social Connections: Socially Integrated (01/10/2023)   Social Connection and Isolation Panel [NHANES]    Frequency of Communication with Friends and Family: More than three times a week    Frequency of Social Gatherings with Friends and Family: Three times a week    Attends Religious Services: More than 4 times per year    Active Member of Clubs or Organizations: Yes    Attends Banker Meetings: More  than 4 times per year    Marital Status: Married   Past Surgical History:  Procedure Laterality Date   BREAST RECONSTRUCTION  1997-2013   implants exchanged   BREAST SURGERY  1995   rt and lt total mastectomies-reconstruction   COLONOSCOPY     FACIAL COSMETIC SURGERY     LYMPH NODE BIOPSY     MOUTH SURGERY     OOPHORECTOMY  1982   rt   Varicose vein injections     Past Medical History:  Diagnosis Date   Abnormal CT scan, chest    Question of abnormal lymph nodes in the chest, biopsy was -2011, felt reactive adenopathy due to silicone implants (subsquently replaced)   Aortic insufficiency    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   Ht 5\' 4"  (1.626 m)   Wt 126 lb (57.2 kg)   BMI 21.63 kg/m   Opioid Risk Score:   Fall Risk Score:  `1  Depression screen [facility] 2/9     03/02/2023   10:19 AM 01/10/2023   10:51 AM 02/22/2021   12:40 PM 02/20/2020    8:08 AM 12/21/2018    8:29 AM 03/10/2016    2:36 PM 07/16/2015    8:45 AM  Depression screen PHQ 2/9  Decreased Interest 0 0 0 0 0 0 0  Down, Depressed, Hopeless 0 0 0 0 0 0 0  PHQ - 2 Score 0 0 0 0 0 0 0  Altered sleeping   0  0    Tired, decreased energy   0  0    Change in appetite   0  0    Feeling bad or failure about yourself    0  0    Trouble concentrating   0  0    Moving slowly or fidgety/restless   0  0    Suicidal  thoughts   0  0    PHQ-9 Score   0  0  Difficult doing work/chores   Not difficult at all        Review of Systems  Musculoskeletal:  Positive for neck pain.       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  -Discussed benefits of exercise in reducing pain. -Discussed following foods that may reduce pain: 1) Ginger (especially studied for arthritis)- reduce leukotriene production to decrease inflammation 2) Blueberries- high in phytonutrients that decrease inflammation 3) Salmon- marine omega-3s reduce joint swelling and pain 4) Pumpkin seeds- reduce inflammation 5) dark chocolate- reduces inflammation 6) turmeric- reduces inflammation 7) tart cherries - reduce pain and stiffness 8) extra virgin olive oil - its compound olecanthal helps to block prostaglandins  9) chili peppers- can be eaten or applied topically via capsaicin 10) mint- helpful for headache, muscle aches, joint pain, and itching 11) garlic- reduces inflammation  Link to further information on diet for chronic pain: http://www.bray.com/   2) Knee OA: -continue topical voltaren gel -Prescribed Zynex Nexwave, cervical traction device, and heating/cooling blanket  -discussed side effects of diclofenac, goal to wean  Turmeric to reduce inflammation--can be used in cooking or taken as a supplement.  Benefits of turmeric:  -Highly anti-inflammatory  -Increases  antioxidants  -Improves memory, attention, brain disease  -Lowers risk of heart disease  -May help prevent cancer  -Decreases pain  -Alleviates depression  -Delays aging and decreases risk of chronic disease  -Consume with black pepper to increase absorption    Turmeric Milk Recipe:  1 cup milk  1 tsp turmeric  1 tsp cinnamon  1 tsp grated ginger (optional)  Black pepper (boosts the anti-inflammatory properties of turmeric).  1 tsp honey   60 minutes spent in discussion of her neck discomfort, cervical spine XR results, perceived worsening cervical posture, current exercise regimen, demonstrating some exercises to improve strength and range of motion of the muscles of the neck and upper back, discussed trigger point injections and her experience with dry needling, trial of tens unit and cervical traction device for her, discussion of possible side effects of diclofenac, that topical voltaren is a safer option than oral, discussed foods that can decrease inflammation, benefits of red light therapy and collagen supplementation

## 2023-04-06 NOTE — Addendum Note (Signed)
Addended by: Horton Chin on: 04/06/2023 10:56 AM   Modules accepted: Level of Service

## 2023-04-06 NOTE — Patient Instructions (Signed)
Red light therapy  Bulletproof coffee  Foods that may reduce pain: 1) Ginger (especially studied for arthritis)- reduce leukotriene production to decrease inflammation 2) Blueberries- high in phytonutrients that decrease inflammation 3) Salmon- marine omega-3s reduce joint swelling and pain 4) Pumpkin seeds- reduce inflammation 5) dark chocolate- reduces inflammation 6) turmeric- reduces inflammation 7) tart cherries - reduce pain and stiffness 8) extra virgin olive oil - its compound olecanthal helps to block prostaglandins  9) chili peppers- can be eaten or applied topically via capsaicin 10) mint- helpful for headache, muscle aches, joint pain, and itching 11) garlic- reduces inflammation  Link to further information on diet for chronic pain: http://www.bray.com/   Turmeric to reduce inflammation--can be used in cooking or taken as a supplement.  Benefits of turmeric:  -Highly anti-inflammatory  -Increases antioxidants  -Improves memory, attention, brain disease  -Lowers risk of heart disease  -May help prevent cancer  -Decreases pain  -Alleviates depression  -Delays aging and decreases risk of chronic disease  -Consume with black pepper to increase absorption    Turmeric Milk Recipe:  1 cup milk  1 tsp turmeric  1 tsp cinnamon  1 tsp grated ginger (optional)  Black pepper (boosts the anti-inflammatory properties of turmeric).  1 tsp honey

## 2023-04-13 DIAGNOSIS — M542 Cervicalgia: Secondary | ICD-10-CM | POA: Diagnosis not present

## 2023-04-14 DIAGNOSIS — M542 Cervicalgia: Secondary | ICD-10-CM | POA: Diagnosis not present

## 2023-04-15 DIAGNOSIS — M542 Cervicalgia: Secondary | ICD-10-CM | POA: Diagnosis not present

## 2023-04-26 ENCOUNTER — Ambulatory Visit: Payer: PPO | Attending: Physical Medicine and Rehabilitation

## 2023-04-26 ENCOUNTER — Other Ambulatory Visit: Payer: Self-pay

## 2023-04-26 DIAGNOSIS — M542 Cervicalgia: Secondary | ICD-10-CM

## 2023-04-26 DIAGNOSIS — R293 Abnormal posture: Secondary | ICD-10-CM

## 2023-04-26 DIAGNOSIS — M6281 Muscle weakness (generalized): Secondary | ICD-10-CM

## 2023-04-26 NOTE — Therapy (Signed)
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, MD MRN: [id number] DOB:27-Oct-1946, 77 y.o., female Today's Date: 04/27/2023  END OF SESSION:  PT End of Session - 04/26/23 1529     Visit Number 1    Number of Visits 9    Date for PT Re-Evaluation 06/29/23    Authorization Type [employer]    PT Start Time 1503    PT Stop Time 1555    PT Time Calculation (min) 52 min    Activity Tolerance Patient tolerated treatment well    Behavior During Therapy [facility] for tasks assessed/performed             Past Medical History:  Diagnosis Date   Abnormal CT scan, chest    Question of abnormal lymph nodes in the chest, biopsy was -2011, felt reactive adenopathy due to silicone implants (subsquently replaced)   Aortic insufficiency    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   Past Surgical History:  Procedure Laterality Date   BREAST RECONSTRUCTION  1997-2013   implants exchanged   BREAST SURGERY  1995   rt and lt total mastectomies-reconstruction   COLONOSCOPY     FACIAL COSMETIC SURGERY     LYMPH NODE BIOPSY     MOUTH SURGERY     OOPHORECTOMY  1982   rt   Varicose vein injections     Patient Active Problem List   Diagnosis Date Noted   Fracture of distal end of radius 07/20/2022   Aortic atherosclerosis (HCC) 02/24/2022   Anxiety  and depression 02/22/2021   Prediabetes 02/21/2021   Bicuspid aortic valve 03/19/2018   Neck pain 12/20/2017   Joint pain 11/26/2016   Vitamin D deficiency 11/25/2016   Hyperlipidemia 05/19/2016   Osteopenia    Personal history of colonic polyps-1 cm hyperplastic splenic flexure 11/29/2012   Atrial bigeminy    Aortic insufficiency    History of left breast cancer 08/28/2009    PCP: Pincus Sanes, MD  REFERRING PROVIDER: Horton Chin, MD  REFERRING DIAG: M54.2 (ICD-10-CM) - Cervical spine pain   THERAPY DIAG:  Cervicalgia  Abnormal posture  Muscle weakness (generalized)  Rationale for Evaluation and Treatment: Rehabilitation  ONSET DATE: 3 years ago  SUBJECTIVE:  SUBJECTIVE STATEMENT: Pt reports a Hx of neck and upper shoulder pain, R>L, for approx 3 years.  Pt reports she undergone RC surgeries c both shoulders. Pt notes she uses a TENs unit, but it does not seem to help. Massage provides temporary relief. Her neck and shoulder bother her at night and she has difficulty finding a comfortable position to sleep. If she sleeps on her sides, the shoulders hurt. Pt runs for exercise, and finds running down hill, which she does to start her run, is jarring to her neck. Pt wants to find out what exercises can help manage the pain. Pt enoies UE pain or N/T.  Hand dominance: Left  PERTINENT HISTORY:  Bilat RCR  PAIN:  Are you having pain? Yes: NPRS scale: 2/10 Pain location: neck and upper shoulders Pain description: ache Aggravating factors: sleeping Relieving factors: masssage temporarily 3/10 at night  PRECAUTIONS: None  WEIGHT BEARING RESTRICTIONS: No  FALLS:  Has patient fallen in last 6 months? 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  PATIENT SURVEYS:  FOTO: Perceived function  65%  COGNITION: Overall cognitive status: Within functional limits for tasks assessed  SENSATION: WFL  POSTURE: rounded shoulders and forward head  PALPATION: TTP of the cervical paraspinals and upper traps with increased muscle tension    CERVICAL ROM:   Active ROM AROM (deg) eval  Flexion 50  Extension 40  Right lateral flexion 18 tight L  Left lateral flexion 25 tight R  Right rotation 50 tight R  Left rotation 60 tight L   (Blank rows = not tested)  UPPER EXTREMITY ROM:  WNLs and equal Active ROM Right eval Left eval  Shoulder flexion    Shoulder extension    Shoulder abduction    Shoulder adduction    Shoulder extension    Shoulder internal rotation    Shoulder external rotation    Elbow flexion    Elbow extension    Wrist flexion    Wrist extension    Wrist ulnar deviation    Wrist radial deviation    Wrist pronation    Wrist supination     (Blank rows = not tested)  UPPER EXTREMITY MMT: Myotome screen is WNLs and equal MMT Right eval Left eval  Shoulder flexion    Shoulder extension    Shoulder abduction    Shoulder adduction    Shoulder extension    Shoulder internal rotation    Shoulder external rotation    Middle trapezius    Lower trapezius    Elbow flexion    Elbow extension    Wrist flexion    Wrist extension    Wrist ulnar deviation    Wrist radial deviation    Wrist pronation    Wrist supination    Grip strength     (Blank rows = not tested)  CERVICAL SPECIAL TESTS:  Spurling's test: Negative  FUNCTIONAL  TESTS:  NT  TODAY'S TREATMENT:  [facility] Adult PT Treatment:                                                DATE: 04/26/23 Self Care: Eval findings, use of foam roller for thoracic ROM, electrode placement for TENs/IFC use   PATIENT EDUCATION:  Education details: Eval findings, POC, self care  Person educated: Patient Education method: Explanation, Demonstration, Tactile cues, and Verbal cues Education comprehension: verbalized understanding, returned demonstration, verbal cues required, and tactile cues required  HOME EXERCISE PROGRAM: To be established  ASSESSMENT:  CLINICAL IMPRESSION: Patient is a 77 y.o. female who was seen today for physical therapy evaluation and treatment for M54.2 (ICD-10-CM) - Cervical spine pain. Pt presents with forward head posture, and decreased cervical ROM with increased muscle tension of the cervical paraspinals and upper traps. Xray report from last year notes degenerative arthritic changes, see above, and patients presentation and pain is consistent with these types of changes. Pt will benefit from skilled PT 1w8 to address impairments to optimize neck function with less pain.   OBJECTIVE IMPAIRMENTS: decreased ROM, decreased strength, increased muscle spasms, impaired flexibility, postural dysfunction, and pain.   ACTIVITY LIMITATIONS: sleeping  PARTICIPATION LIMITATIONS: driving and yard work  PERSONAL FACTORS: Age, Past/current experiences, Time since onset of injury/illness/exacerbation, and 1 comorbidity: bilat shoulder RC repairs  are also affecting patient's functional outcome.   REHAB POTENTIAL: Good  CLINICAL DECISION MAKING: Stable/uncomplicated  EVALUATION COMPLEXITY: Low   GOALS:  SHORT TERM GOALS: Target date: 05/19/23  Pt will be Ind in an initial HEP  Baseline: To be established Goal status: INITIAL  2.   Pt will voice understanding of measures to assist in pain reduction  Baseline: started Goal status: INITIAL  LONG TERM GOALS: Target date: 06/29/23  Pt will be Ind in a final HEP to maintain achieved LOF  Baseline:  Goal status: INITIAL  2.  Increase cervical side bending and rotation by 10d for improved neck function Baseline: see flow sheet Goal status: INITIAL  3.  Pt will report improved neck and upper shoulder pain by 50% for improved QOL and quality of sleep Baseline: 2-3/10 Goal status: INITIAL  4.  Pt will be able to demonstrate proper sitting posture to assist in the reduction of neck stain and pain Baseline: Forward head Goal status: INITIAL  PLAN:  PT FREQUENCY: 1x/week  PT DURATION: 8 weeks  PLANNED INTERVENTIONS: Therapeutic exercises, Therapeutic activity, Neuromuscular re-education, Patient/Family education, Self Care, Aquatic Therapy, Dry Needling, Spinal mobilization, Cryotherapy, Moist heat, Taping, Traction, Ultrasound, Ionotophoresis 4mg /ml Dexamethasone, Manual therapy, and Re-evaluation  PLAN FOR NEXT SESSION: Initiate HEP; progress therex as indicated; use of modalities, manual therapy; and TPDN as indicated.  Dixie Coppa MS, PT 04/28/23 6:10 AM

## 2023-05-16 ENCOUNTER — Ambulatory Visit: Payer: PPO

## 2023-05-16 DIAGNOSIS — M6281 Muscle weakness (generalized): Secondary | ICD-10-CM

## 2023-05-16 DIAGNOSIS — M542 Cervicalgia: Secondary | ICD-10-CM | POA: Diagnosis not present

## 2023-05-16 DIAGNOSIS — R293 Abnormal posture: Secondary | ICD-10-CM

## 2023-05-16 NOTE — Therapy (Signed)
OUTPATIENT PHYSICAL THERAPY CERVICAL EVALUATION   Patient Name: Valerie STORCK, MD MRN: [id number] DOB:[birth year], 77 y.o., female Today's Date: 05/16/2023  END OF SESSION:  PT End of Session - 05/16/23 1732     Visit Number 2    Number of Visits 9    Date for PT Re-Evaluation 06/29/23    Authorization Type [employer]    PT Start Time 1630    PT Stop Time 1715    PT Time Calculation (min) 45 min    Activity Tolerance Patient tolerated treatment well    Behavior During Therapy [facility] for tasks assessed/performed              Past Medical History:  Diagnosis Date   Abnormal CT scan, chest    Question of abnormal lymph nodes in the chest, biopsy was -2011, felt reactive adenopathy due to silicone implants (subsquently replaced)   Aortic insufficiency    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   Past Surgical History:  Procedure Laterality Date   BREAST RECONSTRUCTION  1997-2013   implants exchanged   BREAST SURGERY  1995   rt and lt total mastectomies-reconstruction   COLONOSCOPY     FACIAL COSMETIC SURGERY     LYMPH NODE BIOPSY     MOUTH SURGERY     OOPHORECTOMY  1982   rt   Varicose vein injections     Patient Active Problem List   Diagnosis Date Noted   Fracture of distal end of radius 07/20/2022   Aortic atherosclerosis (HCC) 02/24/2022    Anxiety and depression 02/22/2021   Prediabetes 02/21/2021   Bicuspid aortic valve 03/19/2018   Neck pain 12/20/2017   Joint pain 11/26/2016   Vitamin D deficiency 11/25/2016   Hyperlipidemia 05/19/2016   Osteopenia    Personal history of colonic polyps-1 cm hyperplastic splenic flexure 11/29/2012   Atrial bigeminy    Aortic insufficiency    History of left breast cancer 08/28/2009    PCP: Pincus Sanes, MD  REFERRING PROVIDER: Horton Chin, MD  REFERRING DIAG: M54.2 (ICD-10-CM) - Cervical spine pain   THERAPY DIAG:  Cervicalgia  Abnormal posture  Muscle weakness (generalized)  Rationale for Evaluation and Treatment: Rehabilitation  ONSET DATE: 3 years ago  SUBJECTIVE:  SUBJECTIVE STATEMENT: Pt reports her neck bothers her with activities with head bending forward like; reading, knitting, computer use. Pt reports staying comfortable when sleeping and her neck is very stiff in the AM.  Hand dominance: Left  PAIN:  Are you having pain? Yes: NPRS scale: 0/10 Pain location: neck and upper shoulders Pain description: ache Aggravating factors: sleeping Relieving factors: masssage temporarily 3/10 at night  PERTINENT HISTORY:  Bilat RCR  PRECAUTIONS: None  WEIGHT BEARING RESTRICTIONS: No  FALLS:  Has patient fallen in last 6 months? 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  PATIENT SURVEYS:  FOTO: Perceived function  65%  COGNITION: Overall cognitive status: Within functional limits for tasks assessed  SENSATION: WFL  POSTURE: rounded shoulders and forward head  PALPATION: TTP of the cervical paraspinals and upper traps with increased muscle tension    CERVICAL ROM:   Active ROM AROM (deg) eval  Flexion 50  Extension 40  Right lateral flexion 18 tight L  Left lateral flexion 25 tight R  Right rotation 50 tight R  Left rotation 60 tight L   (Blank rows = not tested)  UPPER EXTREMITY ROM:  WNLs and equal Active ROM Right eval Left eval  Shoulder flexion    Shoulder extension    Shoulder abduction    Shoulder adduction    Shoulder extension    Shoulder internal rotation    Shoulder external rotation    Elbow flexion    Elbow extension    Wrist flexion    Wrist extension    Wrist ulnar deviation    Wrist radial deviation    Wrist pronation    Wrist supination     (Blank rows = not tested)  UPPER EXTREMITY MMT: Myotome screen is WNLs and equal MMT Right eval Left eval  Shoulder flexion    Shoulder extension    Shoulder abduction    Shoulder adduction    Shoulder extension    Shoulder internal rotation    Shoulder external rotation    Middle trapezius    Lower trapezius    Elbow flexion    Elbow extension    Wrist flexion    Wrist extension    Wrist ulnar deviation    Wrist radial deviation    Wrist pronation    Wrist supination    Grip strength     (Blank rows = not tested)  CERVICAL SPECIAL TESTS:  Spurling's test: Negative  FUNCTIONAL TESTS:  NT  TODAY'S TREATMENT:   [facility] Adult PT Treatment:                                                DATE: 05/16/23 Therapeutic Exercise: Supine chin tuck x10 5 sec Supine lift offs x5 10" Standing chin tucks c scapular retractions against wall for feedback x5 5" Standing shoulder abd star pattern RTB against wall for feedback x5 3" Standing shoulder ER RTB  against wall for feedbackx10 3" Seated cervical side bending x3 15" Seated cervical rotation x3 15" HEP  updated  Self Care: Recommended sleeping positions with support for comfort                                                                                                                          [facility] Adult PT Treatment:                                                DATE: 04/26/23 Self Care: Eval findings, use of foam roller for thoracic ROM, electrode placement for TENs/IFC use   PATIENT EDUCATION:  Education details: Eval findings, POC, self care  Person educated: Patient Education method: Explanation, Demonstration, Tactile cues, and Verbal cues Education comprehension: verbalized understanding, returned demonstration, verbal cues required, and tactile cues required  HOME EXERCISE PROGRAM: Access Code: [email or web address] URL: [email or web address] Date: 05/16/2023 Prepared by: Joellyn Rued  Exercises - Supine Cervical Retraction with Towel  - 1-2 x daily - 7 x weekly - 1 sets - 10 reps - 5 hold - Supine Deep Neck Flexor Training - Repetitions  - 1-2 x daily - 7 x weekly - 1 sets - 10 reps - 10 hold - Cervical Retraction at Wall  - 6 x daily - 7 x weekly - 3 sets - 2-3 reps - 5 hold - Seated Scapular Retraction  - 6 x daily - 7 x weekly - 3 sets - 2-3 reps - 5 hold - Seated Upper Trapezius Stretch  - 2 x daily - 7 x weekly - 1 sets - 2-3 reps - 15 hold - Standing Cervical Rotation AROM with Overpressure  - 1 x daily - 7 x weekly - 1 sets - 2-3 reps - 15 hold - Shoulder External Rotation and Scapular Retraction with Resistance  - 1 x daily - 7 x weekly - 1-2 sets - 10 reps - 3 hold - Standing Shoulder Horizontal Abduction with Resistance  - 1 x daily - 7 x weekly - 1-2 sets - 10 reps - 3 hold  ASSESSMENT:  CLINICAL IMPRESSION: PT was provided for education for sleeping positions and support to address pt's decreased quality of sleep. Instructed patient in and pt completed  therex for postural and posterior chain strengthening and for cervical mobility. An HEP was provided. Pt voiced understanding and returned proper demonstration. Pt tolerated PT today without adverse effects. Pt will continue to benefit from skilled PT to address impairments for improved posture and neck function with less pain.  OBJECTIVE IMPAIRMENTS: decreased ROM, decreased strength, increased muscle spasms, impaired flexibility, postural dysfunction, and pain.   ACTIVITY LIMITATIONS: sleeping  PARTICIPATION LIMITATIONS: driving and yard work  PERSONAL FACTORS: Age, Past/current experiences, Time since onset of injury/illness/exacerbation, and 1 comorbidity: bilat shoulder RC repairs  are also affecting patient's functional outcome.   REHAB POTENTIAL: Good  CLINICAL DECISION MAKING: Stable/uncomplicated  EVALUATION COMPLEXITY: Low   GOALS:  SHORT TERM GOALS: Target date: 05/19/23  Pt will be Ind in an initial HEP  Baseline: To be established Goal status: Ongoing  2.  Pt will voice understanding of measures to assist in pain reduction  Baseline: started Goal status: Ongoing  LONG TERM GOALS: Target date: 06/29/23  Pt will be Ind in a final HEP to maintain achieved LOF  Baseline:  Goal status: INITIAL  2.  Increase cervical side bending and rotation by 10d for improved neck function Baseline: see flow sheet Goal status: INITIAL  3.  Pt will report improved neck and upper shoulder pain by 50% for improved QOL and quality of sleep Baseline: 2-3/10 Goal status: INITIAL  4.  Pt will be able to demonstrate proper sitting posture to assist in the reduction of neck stain and pain Baseline: Forward head Goal status: INITIAL  PLAN:  PT FREQUENCY: 1x/week  PT DURATION: 8 weeks  PLANNED INTERVENTIONS: Therapeutic exercises, Therapeutic activity, Neuromuscular re-education, Patient/Family education, Self Care, Aquatic Therapy, Dry Needling, Spinal mobilization, Cryotherapy,  Moist heat, Taping, Traction, Ultrasound, Ionotophoresis 4mg /ml Dexamethasone, Manual therapy, and Re-evaluation  PLAN FOR NEXT SESSION: Initiate HEP; progress therex as indicated; use of modalities, manual therapy; and TPDN as indicated.  Jessie Cowher MS, PT 05/16/23 5:50 PM

## 2023-05-24 ENCOUNTER — Ambulatory Visit: Payer: PPO

## 2023-05-31 ENCOUNTER — Ambulatory Visit: Payer: PPO

## 2023-06-05 ENCOUNTER — Encounter: Payer: PPO | Attending: Physical Medicine and Rehabilitation | Admitting: Physical Medicine and Rehabilitation

## 2023-06-05 DIAGNOSIS — M542 Cervicalgia: Secondary | ICD-10-CM | POA: Insufficient documentation

## 2023-06-05 DIAGNOSIS — M75102 Unspecified rotator cuff tear or rupture of left shoulder, not specified as traumatic: Secondary | ICD-10-CM | POA: Insufficient documentation

## 2023-06-05 DIAGNOSIS — M12811 Other specific arthropathies, not elsewhere classified, right shoulder: Secondary | ICD-10-CM | POA: Insufficient documentation

## 2023-06-05 DIAGNOSIS — M75101 Unspecified rotator cuff tear or rupture of right shoulder, not specified as traumatic: Secondary | ICD-10-CM | POA: Insufficient documentation

## 2023-06-05 DIAGNOSIS — M12812 Other specific arthropathies, not elsewhere classified, left shoulder: Secondary | ICD-10-CM | POA: Insufficient documentation

## 2023-06-07 ENCOUNTER — Ambulatory Visit: Payer: PPO

## 2023-06-13 NOTE — Therapy (Signed)
OUTPATIENT PHYSICAL THERAPY CERVICAL TREATMENT/Discharge   Patient Name: Valerie FURLOUGH, MD MRN: [id number] DOB:04-Jul-1946, 77 y.o., female Today's Date: 06/15/2023  END OF SESSION:  PT End of Session - 06/15/23 0538     Visit Number 3    Number of Visits 9    Date for PT Re-Evaluation 06/29/23    Authorization Type [employer]    PT Start Time 1545    PT Stop Time 1630    PT Time Calculation (min) 45 min    Activity Tolerance Patient tolerated treatment well    Behavior During Therapy [facility] for tasks assessed/performed               Past Medical History:  Diagnosis Date   Abnormal CT scan, chest    Question of abnormal lymph nodes in the chest, biopsy was -2011, felt reactive adenopathy due to silicone implants (subsquently replaced)   Aortic insufficiency    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   Past Surgical History:  Procedure Laterality Date   BREAST RECONSTRUCTION  1997-2013   implants exchanged   BREAST SURGERY  1995   rt and lt total mastectomies-reconstruction   COLONOSCOPY     FACIAL COSMETIC SURGERY     LYMPH NODE BIOPSY     MOUTH SURGERY     OOPHORECTOMY  1982   rt   Varicose vein injections     Patient Active Problem List   Diagnosis Date Noted   Fracture of distal end of radius 07/20/2022   Aortic atherosclerosis (HCC)  02/24/2022   Anxiety and depression 02/22/2021   Prediabetes 02/21/2021   Bicuspid aortic valve 03/19/2018   Neck pain 12/20/2017   Joint pain 11/26/2016   Vitamin D deficiency 11/25/2016   Hyperlipidemia 05/19/2016   Osteopenia    Personal history of colonic polyps-1 cm hyperplastic splenic flexure 11/29/2012   Atrial bigeminy    Aortic insufficiency    History of left breast cancer 08/28/2009    PCP: Pincus Sanes, MD  REFERRING PROVIDER: Horton Chin, MD  REFERRING DIAG: M54.2 (ICD-10-CM) - Cervical spine pain   THERAPY DIAG:  Cervicalgia  Abnormal posture  Muscle weakness (generalized)  Rationale for Evaluation and Treatment: Rehabilitation  ONSET DATE: 3 years ago  SUBJECTIVE:  SUBJECTIVE STATEMENT: Pt reports she is noticing improved neck mobility with daily activities esp backing the car out of the garage. Pt reports she is completing her HEP 2x a day.  Hand dominance: Left  PAIN:  Are you having pain? Yes: NPRS scale: 0/10 Pain location: neck and upper shoulders Pain description: ache Aggravating factors: sleeping Relieving factors: masssage temporarily 3/10 at night  PERTINENT HISTORY:  Bilat RCR  PRECAUTIONS: None  WEIGHT BEARING RESTRICTIONS: No  FALLS:  Has patient fallen in last 6 months? 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  PATIENT  SURVEYS:  FOTO: Perceived function  65%  COGNITION: Overall cognitive status: Within functional limits for tasks assessed  SENSATION: WFL  POSTURE: rounded shoulders and forward head  PALPATION: TTP of the cervical paraspinals and upper traps with increased muscle tension    CERVICAL ROM:   Active ROM AROM (deg) eval 06/14/23   Flexion 50 60  Extension 40 50  Right lateral flexion 18 tight L 30  Left lateral flexion 25 tight R 33  Right rotation 50 tight R 62  Left rotation 60 tight L 68   (Blank rows = not tested)  UPPER EXTREMITY ROM:  WNLs and equal Active ROM Right eval Left eval  Shoulder flexion    Shoulder extension    Shoulder abduction    Shoulder adduction    Shoulder extension    Shoulder internal rotation    Shoulder external rotation    Elbow flexion    Elbow extension    Wrist flexion    Wrist extension    Wrist ulnar deviation    Wrist radial deviation    Wrist pronation    Wrist supination     (Blank rows = not tested)  UPPER EXTREMITY MMT: Myotome screen is WNLs and equal MMT Right eval Left eval  Shoulder flexion    Shoulder extension    Shoulder abduction    Shoulder adduction    Shoulder extension    Shoulder internal rotation    Shoulder external rotation    Middle trapezius    Lower trapezius    Elbow flexion    Elbow extension    Wrist flexion    Wrist extension    Wrist ulnar deviation    Wrist radial deviation    Wrist pronation    Wrist supination    Grip strength     (Blank rows = not tested)  CERVICAL SPECIAL TESTS:  Spurling's test: Negative  FUNCTIONAL TESTS:  NT  TODAY'S TREATMENT:   [facility] Adult PT Treatment:                                                DATE: 05/16/23 Therapeutic Exercise: Seated and standing chin tucks Supine lift offs x5 10" Seated cervical side bending  Seated cervical rotation Seated neck felxion Seated neck ext HEP updated  Self Care: Pt Ed re: for proper posture and  decreasing cervical strain with for reading and computer use including proper set up for desk  top and lap top computers.  Pt Ed for use of taped tennis ball and theracane massage for her neck and upper shoulders Provided taped tennis balls andinfo as to where to obtain adjustable lap top stand and theracane                                                                                                                          [facility] Adult PT Treatment:                                                DATE: 04/26/23 Self Care: Eval findings, use of foam roller for thoracic ROM, electrode placement for TENs/IFC use   PATIENT EDUCATION:  Education details: Eval findings, POC, self care  Person educated: Patient Education method: Explanation, Demonstration, Tactile cues, and Verbal cues Education comprehension: verbalized understanding, returned demonstration, verbal cues required, and tactile cues required  HOME EXERCISE PROGRAM: Access Code: [email or web address] URL: [email or web address] Date: 05/16/2023 Prepared by: Joellyn Rued  Exercises - Supine Cervical Retraction with Towel  - 1-2 x daily - 7 x weekly - 1 sets - 10 reps - 5 hold - Supine Deep Neck Flexor Training - Repetitions  - 1-2 x daily - 7 x weekly - 1 sets - 10 reps - 10 hold - Cervical Retraction at Wall  - 6 x daily - 7 x weekly - 3 sets - 2-3 reps - 5 hold - Seated Scapular Retraction  - 6 x daily - 7 x weekly - 3 sets - 2-3 reps - 5 hold - Seated Upper Trapezius Stretch  - 2 x daily - 7 x weekly - 1 sets - 2-3 reps - 15 hold - Standing Cervical Rotation AROM with Overpressure  - 1 x daily - 7 x weekly - 1 sets - 2-3 reps - 15 hold - Shoulder External Rotation and Scapular Retraction with Resistance  - 1 x daily - 7 x weekly - 1-2 sets - 10 reps - 3 hold - Standing Shoulder Horizontal Abduction with Resistance  - 1 x daily - 7 x weekly - 1-2 sets - 10 reps - 3 hold  ASSESSMENT:  CLINICAL IMPRESSION: Pt completed her last PT  appt today. Pt has competed her HEP consistently and demonstrates proper technique. With completing her HEP consistently, pt reports improved neck mobility with daily activities and ROM assessment found improved ROM in motions.    OBJECTIVE IMPAIRMENTS: decreased ROM, decreased strength, increased muscle spasms, impaired flexibility, postural dysfunction, and pain.   ACTIVITY LIMITATIONS: sleeping  PARTICIPATION LIMITATIONS: driving and yard work  PERSONAL FACTORS: Age, Past/current experiences, Time since onset of injury/illness/exacerbation, and 1 comorbidity: bilat shoulder RC repairs  are also affecting patient's functional outcome.   REHAB POTENTIAL: Good  CLINICAL DECISION MAKING: Stable/uncomplicated  EVALUATION COMPLEXITY: Low   GOALS:  SHORT TERM GOALS: Target  date: 05/19/23  Pt will be Ind in an initial HEP  Baseline: To be established Goal status: Ongoing  2.  Pt will voice understanding of measures to assist in pain reduction  Baseline: started Goal status: Ongoing  LONG TERM GOALS: Target date: 06/29/23  Pt will be Ind in a final HEP to maintain achieved LOF  Baseline:  Goal status: MET 06/14/23  2.  Increase cervical side bending and rotation by 10d for improved neck function Baseline: see flow sheet Goal status: MET 06/14/23  3.  Pt will report improved neck and upper shoulder pain by 50% for improved QOL and quality of sleep Baseline: 2-3/10 06/14/23; 0-10 Goal status: MET  4.  Pt will be able to demonstrate proper sitting posture to assist in the reduction of neck stain and pain Baseline: Forward head Goal status: MET 06/14/23  PLAN:  PT FREQUENCY: 1x/week  PT DURATION: 8 weeks  PLANNED INTERVENTIONS: Therapeutic exercises, Therapeutic activity, Neuromuscular re-education, Patient/Family education, Self Care, Aquatic Therapy, Dry Needling, Spinal mobilization, Cryotherapy, Moist heat, Taping, Traction, Ultrasound, Ionotophoresis 4mg /ml Dexamethasone,  Manual therapy, and Re-evaluation  PLAN FOR NEXT SESSION: Initiate HEP; progress therex as indicated; use of modalities, manual therapy; and TPDN as indicated.  PHYSICAL THERAPY DISCHARGE SUMMARY  Visits from Start of Care: 3  Current functional level related to goals / functional outcomes: See clinical impression and PT goals    Remaining deficits: See clinical impression and PT goals    Education / Equipment: HEP and Pt Ed   Patient agrees to discharge. Patient goals were met. Patient is being discharged due to being pleased with the current functional level.   Briar Witherspoon MS, PT 06/15/23 6:03 AM

## 2023-06-14 ENCOUNTER — Ambulatory Visit: Payer: PPO | Attending: Physical Medicine and Rehabilitation

## 2023-06-14 DIAGNOSIS — R293 Abnormal posture: Secondary | ICD-10-CM | POA: Insufficient documentation

## 2023-06-14 DIAGNOSIS — M542 Cervicalgia: Secondary | ICD-10-CM | POA: Insufficient documentation

## 2023-06-14 DIAGNOSIS — M6281 Muscle weakness (generalized): Secondary | ICD-10-CM | POA: Insufficient documentation

## 2023-06-21 ENCOUNTER — Ambulatory Visit: Payer: PPO

## 2023-06-28 ENCOUNTER — Ambulatory Visit: Payer: PPO

## 2023-07-05 ENCOUNTER — Ambulatory Visit: Payer: PPO

## 2023-07-13 ENCOUNTER — Other Ambulatory Visit (HOSPITAL_COMMUNITY): Payer: Self-pay

## 2023-07-13 ENCOUNTER — Other Ambulatory Visit: Payer: Self-pay | Admitting: Internal Medicine

## 2023-07-13 ENCOUNTER — Other Ambulatory Visit: Payer: Self-pay

## 2023-07-13 MED ORDER — DICLOFENAC SODIUM 75 MG PO TBEC
75.0000 mg | DELAYED_RELEASE_TABLET | Freq: Two times a day (BID) | ORAL | 3 refills | Status: DC | PRN
  Filled 2023-07-13: qty 180, 90d supply, fill #0
  Filled 2024-01-11: qty 180, 90d supply, fill #1

## 2023-07-13 MED ORDER — EZETIMIBE 10 MG PO TABS
10.0000 mg | ORAL_TABLET | Freq: Every day | ORAL | 2 refills | Status: DC
  Filled 2023-07-13 – 2023-11-28 (×2): qty 90, 90d supply, fill #0
  Filled 2024-04-10: qty 90, 90d supply, fill #1
  Filled 2024-07-16: qty 90, 90d supply, fill #2

## 2023-07-25 ENCOUNTER — Ambulatory Visit (HOSPITAL_COMMUNITY): Payer: PPO | Attending: Internal Medicine

## 2023-07-25 DIAGNOSIS — E785 Hyperlipidemia, unspecified: Secondary | ICD-10-CM | POA: Insufficient documentation

## 2023-07-25 DIAGNOSIS — Z79899 Other long term (current) drug therapy: Secondary | ICD-10-CM | POA: Insufficient documentation

## 2023-07-25 DIAGNOSIS — I351 Nonrheumatic aortic (valve) insufficiency: Secondary | ICD-10-CM | POA: Diagnosis not present

## 2023-07-25 LAB — ECHOCARDIOGRAM COMPLETE
AR max vel: 1.15 cm2
AV Area VTI: 1.24 cm2
AV Area mean vel: 1.13 cm2
AV Mean grad: 6.5 mmHg
AV Peak grad: 12.4 mmHg
Ao pk vel: 1.76 m/s
Area-P 1/2: 3.91 cm2
P 1/2 time: 744 ms
S' Lateral: 3.1 cm

## 2023-08-03 ENCOUNTER — Inpatient Hospital Stay: Admission: RE | Admit: 2023-08-03 | Payer: PPO | Source: Ambulatory Visit

## 2023-08-04 ENCOUNTER — Encounter: Payer: Self-pay | Admitting: Internal Medicine

## 2023-08-15 ENCOUNTER — Ambulatory Visit (INDEPENDENT_AMBULATORY_CARE_PROVIDER_SITE_OTHER)
Admission: RE | Admit: 2023-08-15 | Discharge: 2023-08-15 | Disposition: A | Discharge status: Home / Self Care | Payer: PPO | Source: Ambulatory Visit | Attending: Internal Medicine | Admitting: Internal Medicine

## 2023-08-15 DIAGNOSIS — M8589 Other specified disorders of bone density and structure, multiple sites: Secondary | ICD-10-CM | POA: Diagnosis not present

## 2023-08-20 ENCOUNTER — Encounter: Payer: Self-pay | Admitting: Internal Medicine

## 2023-08-21 ENCOUNTER — Other Ambulatory Visit: Payer: PPO

## 2023-08-28 ENCOUNTER — Other Ambulatory Visit (HOSPITAL_COMMUNITY): Payer: Self-pay

## 2023-08-28 MED ORDER — ALENDRONATE SODIUM 70 MG PO TABS
70.0000 mg | ORAL_TABLET | ORAL | 3 refills | Status: DC
  Filled 2023-08-28: qty 12, 84d supply, fill #0
  Filled 2023-11-08: qty 12, 84d supply, fill #1
  Filled 2024-02-05: qty 12, 84d supply, fill #2

## 2023-08-28 NOTE — Addendum Note (Signed)
Addended by: Pincus Sanes on: 08/28/2023 08:18 AM   Modules accepted: Orders

## 2023-10-13 ENCOUNTER — Other Ambulatory Visit: Payer: Self-pay | Admitting: Internal Medicine

## 2023-10-13 ENCOUNTER — Other Ambulatory Visit (HOSPITAL_COMMUNITY): Payer: Self-pay

## 2023-10-13 ENCOUNTER — Encounter (HOSPITAL_COMMUNITY): Payer: Self-pay

## 2023-10-14 ENCOUNTER — Other Ambulatory Visit: Payer: Self-pay | Admitting: Internal Medicine

## 2023-10-16 ENCOUNTER — Other Ambulatory Visit (HOSPITAL_COMMUNITY): Payer: Self-pay

## 2023-10-16 MED ORDER — HYDROCHLOROTHIAZIDE 12.5 MG PO CAPS
12.5000 mg | ORAL_CAPSULE | Freq: Every day | ORAL | 0 refills | Status: DC
  Filled 2023-10-16: qty 90, 90d supply, fill #0

## 2023-10-28 ENCOUNTER — Other Ambulatory Visit (HOSPITAL_COMMUNITY): Payer: Self-pay

## 2023-11-09 ENCOUNTER — Other Ambulatory Visit (HOSPITAL_COMMUNITY): Payer: Self-pay

## 2023-11-28 ENCOUNTER — Other Ambulatory Visit (HOSPITAL_COMMUNITY): Payer: Self-pay

## 2024-01-08 ENCOUNTER — Ambulatory Visit: Payer: PPO

## 2024-01-08 VITALS — Ht 64.0 in | Wt 124.0 lb

## 2024-01-08 DIAGNOSIS — Z0000 Encounter for general adult medical examination without abnormal findings: Secondary | ICD-10-CM

## 2024-01-08 NOTE — Patient Instructions (Signed)
 Valerie Phelps , Thank you for taking time to come for your Medicare Wellness Visit. I appreciate your ongoing commitment to your health goals. Please review the following plan we discussed and let me know if I can assist you in the future.   Referrals/Orders/Follow-Ups/Clinician Recommendations: It was nice to talk with you today.  Keep up the good work.  This is a list of the screening recommended for you and due dates:  Health Maintenance  Topic Date Due   COVID-19 Vaccine (6 - 2024-25 season) 09/29/2023   Medicare Annual Wellness Visit  01/07/2025   DEXA scan (bone density measurement)  08/14/2025   DTaP/Tdap/Td vaccine (2 - Tdap) 03/30/2033   Pneumonia Vaccine  Completed   Flu Shot  Completed   Hepatitis C Screening  Completed   Zoster (Shingles) Vaccine  Completed   HPV Vaccine  Aged Out   Colon Cancer Screening  Discontinued    Advanced directives: (Copy Requested) Please bring a copy of your health care power of attorney and living will to the office to be added to your chart at your convenience.  Next Medicare Annual Wellness Visit scheduled for next year: Yes

## 2024-01-08 NOTE — Progress Notes (Signed)
 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  Visit Complete: Virtual I connected with  Valerie Carwin, MD on 01/12/24 by a video and audio enabled telemedicine application and verified that I am speaking with the correct person using two identifiers.  Patient Location: Home  Provider Location: Home Office  I discussed the limitations of evaluation and management by telemedicine. The patient expressed understanding and agreed to proceed.  Vital Signs: Because this visit was a virtual/telehealth visit, some criteria may be missing or patient reported. Any vitals not documented were not able to be obtained and vitals that have been documented are patient reported.   Cardiac Risk Factors include: advanced age (>69men, >9 women);dyslipidemia;Other (see comment), Risk factor comments: Aortic atherosclerosis     Objective:    Today's Vitals   01/08/24 1456  Weight: 124 lb (56.2 kg)  Height: 5\' 4"  (1.626 m)   Body mass index is 21.28 kg/m.     01/08/2024    3:08 PM 04/26/2023    3:09 PM 01/10/2023   10:49 AM 03/10/2016    2:36 PM 03/10/2016    2:35 PM 03/20/2014   10:40 AM 03/27/2012    1:29 PM  Advanced Directives  Does Patient Have a Medical Advance Directive? Yes Yes Yes No No Patient has advance directive, copy not in chart Patient has advance directive, copy not in chart  Type of Advance Directive Healthcare Power of [place];Living will Healthcare Power of [place];Living will Living will;Healthcare Power of [employer] Power of [place];Living will Healthcare Power of Attorney  Does patient want to make changes to medical advance directive?  No - Patient declined No - Patient declined      Copy of Healthcare Power of Attorney in Chart? No - copy requested No - copy requested No - copy requested      Would patient like information on creating a medical advance directive?  No - Patient declined   No - patient declined information No - patient declined information    Pre-existing out of facility DNR order (yellow form or pink MOST form)      No     Current Medications (verified) Outpatient Encounter Medications as of 01/08/2024  Medication Sig   alendronate (FOSAMAX) 70 MG tablet Take 1 tablet (70 mg total) by mouth every 7 (seven) days. Take with a full glass of water on an empty stomach.   buPROPion (WELLBUTRIN XL) 150 MG 24 hr tablet Take 1 tablet (150 mg total) by mouth daily.   Cholecalciferol 100 MCG (4000 UT) CAPS Take by mouth.   Cholecalciferol 50 MCG (2000 UT) CAPS    diclofenac (VOLTAREN) 75 MG EC tablet Take 1 tablet (75 mg total) by mouth 2 (two) times daily as needed for mild pain.   ezetimibe (ZETIA) 10 MG tablet Take 1 tablet (10 mg total) by mouth daily.   hydrochlorothiazide (MICROZIDE) 12.5 MG capsule Take 1 capsule (12.5 mg total) by mouth daily.   rosuvastatin (CRESTOR) 10 MG tablet Take 1 tablet (10 mg total) by mouth daily.   tretinoin (RETIN-A) 0.05 % cream Apply 1 Application topically on the skin nightly.   [DISCONTINUED] ALPRAZolam (XANAX) 0.25 MG tablet Take 1 tablet (0.25 mg total) by mouth at bedtime as needed for anxiety.   [DISCONTINUED] traMADol (ULTRAM) 50 MG tablet Take 1 tablet (50 mg total) by mouth every 6 (six) hours as needed.   No facility-administered encounter  medications on file as of 01/08/2024.    Allergies (verified) Patient has no known allergies.   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   Past Surgical History:  Procedure Laterality Date   BREAST RECONSTRUCTION  1997-2013   implants exchanged   BREAST SURGERY  1995   rt and lt total mastectomies-reconstruction   COLONOSCOPY     FACIAL COSMETIC SURGERY     LYMPH NODE BIOPSY     MOUTH SURGERY     OOPHORECTOMY  1982   rt   Varicose vein injections     Family History  Problem Relation Age of Onset   Prostate cancer Father    Breast cancer Maternal Aunt 45   Breast cancer Paternal Grandmother 28   Colon cancer Paternal Grandfather        rectal   Osteoarthritis Sister    Aneurysm Brother 72       brain, no intervention   Social History   Socioeconomic History   Marital status: Married    Spouse name: Bruce   Number of children: 3   Years of education: 25   Highest education level: Not on file  Occupational History   Occupation: RETIRED/physician/GI    Employer: [employer]    Comment: [employer]  Tobacco Use   Smoking status: Never   Smokeless tobacco: Never  Vaping Use   Vaping status: Former  Substance and Sexual Activity   Alcohol use: Yes    Alcohol/week: 5.0 standard drinks of alcohol    Types: 5 drink(s) per week    Comment: occ   Drug use: No   Sexual activity: Yes    Partners: Male    Birth control/protection: Post-menopausal  Other Topics Concern   Not on file  Social History Narrative   Arlana [employer]ouch, [employer], [employer]. Married - '71- 7 years/divorced; Married '86. 1 son - '85; 2 step-children. work - [employer]. No history of abuse. ACP/end of  life - full resiscitation, reasonable duration mechanical ventilatory support, no heroic/futile measures. HCPOA - spouse      Lives with husband a dog and a cat-2025   Social Drivers of Health   Financial Resource Strain: Low Risk  (01/08/2024)   Overall Financial Resource Strain (CARDIA)    Difficulty of Paying Living Expenses: Not hard at all  Food Insecurity: No Food Insecurity (01/08/2024)   Hunger Vital Sign    Worried About Running Out of Food in the Last Year: Never true    Ran Out of Food  in the Last Year: Never true  Transportation Needs: No Transportation Needs (01/08/2024)   PRAPARE - Administrator, Civil Service (Medical): No    Lack of Transportation (Non-Medical): No  Physical Activity: Sufficiently Active (01/08/2024)   Exercise Vital Sign    Days of Exercise per Week: 7 days    Minutes of Exercise per Session: 60 min  Stress: No Stress Concern Present (01/08/2024)   [employer] of Occupational Health - Occupational Stress Questionnaire    Feeling of Stress : Only a little  Social Connections: Moderately Integrated (01/08/2024)   Social Connection and Isolation Panel [NHANES]    Frequency of Communication with Friends and Family: Twice a week    Frequency of Social Gatherings with Friends and Family: Once a week    Attends Religious Services: Never    Database administrator or Organizations: Yes    Attends Engineer, structural: 1 to 4 times per year    Marital Status: Married    Tobacco Counseling Counseling given: Not Answered   Clinical Intake:  Pre-visit preparation completed: Yes  Pain : No/denies pain     BMI - recorded: 21.28 Nutritional Status: BMI of 19-24  Normal Nutritional Risks: None  How often do you need to have someone help you when you read instructions, pamphlets, or other written materials from your doctor or pharmacy?: 1 - Never  Interpreter Needed?: No  Information entered by :: Jolee Critcher,  RMA   Activities of Daily Living    01/08/2024    2:57 PM  In your present state of health, do you have any difficulty performing the following activities:  Hearing? 0  Vision? 0  Difficulty concentrating or making decisions? 0  Walking or climbing stairs? 0  Dressing or bathing? 0  Doing errands, shopping? 0  Preparing Food and eating ? N  Using the Toilet? N  In the past six months, have you accidently leaked urine? N  Do you have problems with loss of bowel control? N  Managing your Medications? N  Managing your Finances? N  Housekeeping or managing your Housekeeping? N    Patient Care Team: Pincus Sanes, MD as PCP - General (Internal Medicine) Blima Ledger, OD (Optometry) Enid Baas, MD (Sports Medicine) Pricilla Riffle, MD as Consulting Physician (Cardiology)  Indicate any recent Medical Services you may have received from other than [facility] providers in the past year (date may be approximate).     Assessment:   This is a routine wellness examination for Valerie Phelps.  Hearing/Vision screen Hearing Screening - Comments:: Wears hearing aides  Vision Screening - Comments:: Wears eyeglasses for driving   Goals Addressed             This Visit's Progress    Remain Healthy and Independent   On track     Depression Screen    01/08/2024    3:12 PM 03/02/2023   10:19 AM 01/10/2023   10:51 AM 02/22/2021   12:40 PM 02/22/2021    8:37 AM 02/20/2020    8:08 AM 12/21/2018    8:29 AM  PHQ 2/9 Scores  PHQ - 2 Score 0 0 0 0  0 0  PHQ- 9 Score 0   0   0  Exception Documentation     Patient refusal      Fall Risk    01/08/2024    3:09 PM 03/02/2023   10:19 AM 01/10/2023   10:42 AM 02/22/2021    8:32  AM 02/20/2020    8:07 AM  Fall Risk   Falls in the past year? 0 0 0 0 0  Number falls in past yr: 0 0 0 0 0  Injury with Fall? 0 0 0 0 0  Risk for fall due to : No Fall Risks No Fall Risks  No Fall Risks   Follow up Falls prevention discussed;Falls evaluation completed Falls  evaluation completed Falls prevention discussed;Education provided;Falls evaluation completed Falls evaluation completed     MEDICARE RISK AT HOME: Medicare Risk at Home Any stairs in or around the home?: Yes If so, are there any without handrails?: Yes Home free of loose throw rugs in walkways, pet beds, electrical cords, etc?: No Adequate lighting in your home to reduce risk of falls?: Yes Life alert?: No Use of a cane, walker or w/c?: No Grab bars in the bathroom?: No Shower chair or bench in shower?: Yes Elevated toilet seat or a handicapped toilet?: Yes  TIMED UP AND GO:  Was the test performed?  No    Cognitive Function:        01/08/2024    2:58 PM 01/10/2023   10:50 AM  6CIT Screen  What Year? 0 points 0 points  What month? 0 points 0 points  What time? 0 points 0 points  Count back from 20 0 points 0 points  Months in reverse 0 points 0 points  Repeat phrase 0 points 0 points  Total Score 0 points 0 points    Immunizations Immunization History  Administered Date(s) Administered   DTaP 03/31/2023   Fluad Quad(high Dose 65+) 07/20/2019, 08/17/2020, 07/30/2021   Influenza, High Dose Seasonal PF 07/16/2015, 07/22/2016, 08/07/2017, 09/11/2018, 07/30/2021, 07/22/2022, 08/04/2023   Influenza,inj,quad, With Preservative 07/24/2017, 07/22/2018   Influenza-Unspecified 07/22/2012   Moderna SARS-COV2 Booster Vaccination 07/30/2021   PFIZER(Purple Top)SARS-COV-2 Vaccination 12/07/2019, 12/28/2019, 07/21/2020   Pfizer Covid-19 Vaccine Bivalent Booster 20yrs & up 08/13/2022   Pfizer(Comirnaty)Fall Seasonal Vaccine 12 years and older 08/04/2023   Pneumococcal Conjugate-13 07/16/2015   Pneumococcal Polysaccharide-23 09/01/2009, 07/14/2014   Respiratory Syncytial Virus Vaccine,Recomb Aduvanted(Arexvy) 08/13/2022, 03/31/2023   Zoster Recombinant(Shingrix) 01/05/2018, 03/12/2018   Zoster, Live 10/07/2013    TDAP status: Up to date  Flu Vaccine status: Up to  date  Pneumococcal vaccine status: Up to date  Covid-19 vaccine status: Completed vaccines  Qualifies for Shingles Vaccine? Yes   Zostavax completed Yes   Shingrix Completed?: Yes  Screening Tests Health Maintenance  Topic Date Due   COVID-19 Vaccine (6 - 2024-25 season) 09/29/2023   Medicare Annual Wellness (AWV)  01/07/2025   DEXA SCAN  08/14/2025   DTaP/Tdap/Td (2 - Tdap) 03/30/2033   Pneumonia Vaccine 64+ Years old  Completed   INFLUENZA VACCINE  Completed   Hepatitis C Screening  Completed   Zoster Vaccines- Shingrix  Completed   HPV VACCINES  Aged Out   Colonoscopy  Discontinued    Health Maintenance  Health Maintenance Due  Topic Date Due   COVID-19 Vaccine (6 - 2024-25 season) 09/29/2023    Colorectal cancer screening: No longer required.   Mammogram status: No longer required due to  . S/p bilateral mastectomy  Bone Density status: Completed 08/18/2023. Results reflect: Bone density results: OSTEOPOROSIS. Repeat every 2 years.  Lung Cancer Screening: (Low Dose CT Chest recommended if Age 17-80 years, 20 pack-year currently smoking OR have quit w/in 15years.) does not qualify.   Lung Cancer Screening Referral: N/A  Additional Screening:  Hepatitis C Screening: does not qualify; Completed  07/16/2015  Vision Screening: Recommended annual ophthalmology exams for early detection of glaucoma and other disorders of the eye. Is the patient up to date with their annual eye exam?  Yes  Who is the provider or what is the name of the office in which the patient attends annual eye exams? [facility] If pt is not established with a provider, would they like to be referred to a provider to establish care? No .   Dental Screening: Recommended annual dental exams for proper oral hygiene   Community Resource Referral / Chronic Care Management: CRR required this visit?  No   CCM required this visit?  No     Plan:     I have personally reviewed and noted the  following in the patient's chart:   Medical and social history Use of alcohol, tobacco or illicit drugs  Current medications and supplements including opioid prescriptions. Patient is not currently taking opioid prescriptions. Functional ability and status Nutritional status Physical activity Advanced directives List of other physicians Hospitalizations, surgeries, and ER visits in previous 12 months Vitals Screenings to include cognitive, depression, and falls Referrals and appointments  In addition, I have reviewed and discussed with patient certain preventive protocols, quality metrics, and best practice recommendations. A written personalized care plan for preventive services as well as general preventive health recommendations were provided to patient.     Merrell Rettinger L Hamda Klutts, CMA   01/12/2024   After Visit Summary: (MyChart) Due to this being a telephonic visit, the after visit summary with patients personalized plan was offered to patient via MyChart   Nurse Notes: Patient is up to date with all her health maintenance.  She had no concerns to address today.

## 2024-01-11 ENCOUNTER — Other Ambulatory Visit: Payer: Self-pay | Admitting: Internal Medicine

## 2024-01-11 ENCOUNTER — Other Ambulatory Visit: Payer: Self-pay

## 2024-01-11 MED ORDER — ALPRAZOLAM 0.25 MG PO TABS
0.2500 mg | ORAL_TABLET | Freq: Every evening | ORAL | 1 refills | Status: DC | PRN
  Filled 2024-01-11 – 2024-01-12 (×2): qty 30, 30d supply, fill #0

## 2024-01-11 MED ORDER — TRAMADOL HCL 50 MG PO TABS
50.0000 mg | ORAL_TABLET | Freq: Four times a day (QID) | ORAL | 3 refills | Status: DC | PRN
Start: 2024-01-11 — End: 2024-03-07
  Filled 2024-01-11 – 2024-01-12 (×2): qty 60, 15d supply, fill #0

## 2024-01-12 ENCOUNTER — Other Ambulatory Visit: Payer: Self-pay

## 2024-01-12 ENCOUNTER — Encounter (HOSPITAL_COMMUNITY): Payer: Self-pay

## 2024-01-12 ENCOUNTER — Other Ambulatory Visit (HOSPITAL_COMMUNITY): Payer: Self-pay

## 2024-01-15 ENCOUNTER — Telehealth: Payer: Self-pay | Admitting: Internal Medicine

## 2024-01-15 ENCOUNTER — Other Ambulatory Visit (HOSPITAL_COMMUNITY): Payer: Self-pay

## 2024-01-15 MED ORDER — HYDROCHLOROTHIAZIDE 12.5 MG PO CAPS
12.5000 mg | ORAL_CAPSULE | Freq: Every day | ORAL | 3 refills | Status: DC
  Filled 2024-01-15: qty 90, 90d supply, fill #0

## 2024-01-15 MED ORDER — HYDROCHLOROTHIAZIDE 12.5 MG PO CAPS
12.5000 mg | ORAL_CAPSULE | Freq: Every day | ORAL | 0 refills | Status: DC
  Filled 2024-01-15: qty 30, 30d supply, fill #0

## 2024-01-15 NOTE — Telephone Encounter (Signed)
 Pt's medication was sent to pt's pharmacy as requested. Confirmation received.

## 2024-01-15 NOTE — Addendum Note (Signed)
 Addended by: Virl Axe, Trichelle Lehan L on: 01/15/2024 03:20 PM   Modules accepted: Orders

## 2024-01-15 NOTE — Telephone Encounter (Signed)
 Placed refill for hydrochlorothiazide per Dr. Tenny Craw.

## 2024-01-15 NOTE — Telephone Encounter (Signed)
 Received msg fropm patient   Needs refill on hydrochlorothiazide    12.5 mg    One tab per day   Fill 90 tabs   3 refills     Looks like I need to see her    I will work with Dewayne Hatch to get her in sooner   She is doing well  per her report

## 2024-01-15 NOTE — Telephone Encounter (Signed)
*  STAT* If patient is at the pharmacy, call can be transferred to refill team.   1. Which medications need to be refilled? (please list name of each medication and dose if known) hydrochlorothiazide (MICROZIDE) 12.5 MG capsule    2. Would you like to learn more about the convenience, safety, & potential cost savings by using the [facility]?      3. Are you open to using the [facility] (Type [facility]. ).   4. Which pharmacy/location (including street and city if local pharmacy) is medication to be sent to?  [place] [facility]     5. Do they need a 30 day or 90 day supply? 90 day

## 2024-02-05 ENCOUNTER — Other Ambulatory Visit: Payer: Self-pay

## 2024-02-05 ENCOUNTER — Other Ambulatory Visit: Payer: Self-pay | Admitting: Internal Medicine

## 2024-02-05 DIAGNOSIS — H40053 Ocular hypertension, bilateral: Secondary | ICD-10-CM | POA: Diagnosis not present

## 2024-02-05 DIAGNOSIS — H40013 Open angle with borderline findings, low risk, bilateral: Secondary | ICD-10-CM | POA: Diagnosis not present

## 2024-02-07 ENCOUNTER — Other Ambulatory Visit (HOSPITAL_COMMUNITY): Payer: Self-pay

## 2024-02-07 MED ORDER — ROSUVASTATIN CALCIUM 10 MG PO TABS
10.0000 mg | ORAL_TABLET | Freq: Every day | ORAL | 0 refills | Status: DC
  Filled 2024-02-07: qty 30, 30d supply, fill #0

## 2024-02-08 ENCOUNTER — Other Ambulatory Visit (HOSPITAL_COMMUNITY): Payer: Self-pay

## 2024-03-04 ENCOUNTER — Encounter: Payer: PPO | Admitting: Internal Medicine

## 2024-03-06 ENCOUNTER — Encounter: Payer: Self-pay | Admitting: Internal Medicine

## 2024-03-06 NOTE — Progress Notes (Unsigned)
 Subjective:    Patient ID: Valerie Carwin, MD, female    DOB: [birth year], 78 y.o.   MRN: [id number]      HPI [name] is here for a Physical exam and her chronic medical problems.    Overall she is doing well and has no major concerns.  Medications and allergies reviewed with patient and updated if appropriate.  Current Outpatient Medications on File Prior to Visit  Medication Sig Dispense Refill   alendronate (FOSAMAX) 70 MG tablet Take 1 tablet (70 mg total) by mouth every 7 (seven) days. Take with a full glass of water on an empty stomach. 12 tablet 3   ALPRAZolam (XANAX) 0.25 MG tablet Take 1 tablet (0.25 mg total) by mouth at bedtime as needed for anxiety. 30 tablet 1   buPROPion (WELLBUTRIN XL) 150 MG 24 hr tablet Take 1 tablet (150 mg total) by mouth daily. 90 tablet 3   Cholecalciferol 100 MCG (4000 UT) CAPS Take by mouth.     Cholecalciferol 50 MCG (2000 UT) CAPS      diclofenac (VOLTAREN) 75 MG EC tablet Take 1 tablet (75 mg total) by mouth 2 (two) times daily as needed for mild pain. 180 tablet 3   ezetimibe (ZETIA) 10 MG tablet Take 1 tablet (10 mg total) by mouth daily. 90 tablet 2   hydrochlorothiazide (MICROZIDE) 12.5 MG capsule Take 1 capsule (12.5 mg total) by mouth daily. 90 capsule 3   rosuvastatin (CRESTOR) 10 MG tablet Take 1 tablet (10 mg total) by mouth daily. *Need appt for refills* 30 tablet 0   traMADol (ULTRAM) 50 MG tablet Take 1 tablet (50 mg total) by mouth every 6 (six) hours as needed. 60 tablet 3   No current facility-administered medications on file prior to visit.    Review of Systems  Constitutional:  Negative for fever.  Eyes:  Negative for visual disturbance.  Respiratory:  Negative for cough, shortness of breath and wheezing.   Cardiovascular:  Negative for chest pain, palpitations and leg swelling.  Gastrointestinal:  Positive for constipation (controlled). Negative for abdominal pain, blood in stool and diarrhea.       No gerd   Genitourinary:  Negative for dysuria.  Musculoskeletal:  Positive for arthralgias (mild) and neck pain (chronic OA). Negative for back pain.  Skin:  Negative for rash.  Neurological:  Positive for light-headedness (occ). Negative for headaches.  Psychiatric/Behavioral:  Negative for dysphoric mood. The patient is not nervous/anxious.        Objective:   Vitals:   03/07/24 1030  BP: 104/72  Pulse: (!) 102  Temp: 97.7 F (36.5 C)  SpO2: 98%   Filed Weights   03/07/24 1030  Weight: 126 lb (57.2 kg)   Body mass index is 21.63 kg/m.  BP Readings from Last 3 Encounters:  03/07/24 104/72  04/06/23 122/74  03/02/23 122/78    Wt Readings from Last 3 Encounters:  03/07/24 126 lb (57.2 kg)  01/08/24 124 lb (56.2 kg)  04/06/23 126 lb (57.2 kg)       Physical Exam Constitutional: She appears well-developed and well-nourished. No distress.  HENT:  Head: Normocephalic and atraumatic.  Right Ear: External ear normal. Normal ear canal and TM Left Ear: External ear normal.  Normal ear canal and TM Mouth/Throat: Oropharynx is clear and moist.  Eyes: Conjunctivae normal.  Neck: Neck supple. No tracheal deviation present. No thyromegaly present.  No carotid bruit  Cardiovascular: Normal rate, regular rhythm and normal heart sounds.  No murmur heard.  No edema. Pulmonary/Chest: Effort normal and breath sounds normal. No respiratory distress. She has no wheezes. She has no rales.  Breast: deferred   Abdominal: Soft. She exhibits no distension. There is no tenderness.  Lymphadenopathy: She has no cervical adenopathy.  Skin: Skin is warm and dry. She is not diaphoretic.  Psychiatric: She has a normal mood and affect. Her behavior is normal.     Lab Results  Component Value Date   WBC 4.9 03/02/2023   HGB 13.1 03/02/2023   HCT 39.7 03/02/2023   PLT 241.0 03/02/2023   GLUCOSE 88 03/02/2023   CHOL 168 03/02/2023   TRIG 73.0 03/02/2023   HDL 81.00 03/02/2023   LDLCALC 72  03/02/2023   ALT 24 03/02/2023   AST 24 03/02/2023   NA 140 03/02/2023   K 4.7 03/02/2023   CL 103 03/02/2023   CREATININE 1.02 03/02/2023   BUN 26 (H) 03/02/2023   CO2 30 03/02/2023   TSH 3.20 03/02/2023   INR 1.0 02/24/2022   HGBA1C 6.1 03/02/2023         Assessment & Plan:   Physical exam: Screening blood work  ordered Exercise  run, bike, weights 3 / week Weight  normal Substance abuse  none   Reviewed recommended immunizations.   Health Maintenance  Topic Date Due   COVID-19 Vaccine (6 - Pfizer risk 2024-25 season) 03/22/2024 (Originally 02/01/2024)   INFLUENZA VACCINE  06/21/2024   Medicare Annual Wellness (AWV)  01/07/2025   DEXA SCAN  08/14/2025   DTaP/Tdap/Td (2 - Tdap) 03/30/2033   Pneumonia Vaccine 75+ Years old  Completed   Hepatitis C Screening  Completed   Zoster Vaccines- Shingrix  Completed   HPV VACCINES  Aged Out   Meningococcal B Vaccine  Aged Out   Colonoscopy  Discontinued          See Problem List for Assessment and Plan of chronic medical problems.

## 2024-03-06 NOTE — Patient Instructions (Addendum)

## 2024-03-07 ENCOUNTER — Other Ambulatory Visit (HOSPITAL_COMMUNITY): Payer: Self-pay

## 2024-03-07 ENCOUNTER — Ambulatory Visit (INDEPENDENT_AMBULATORY_CARE_PROVIDER_SITE_OTHER): Payer: PPO | Admitting: Internal Medicine

## 2024-03-07 ENCOUNTER — Other Ambulatory Visit: Payer: Self-pay

## 2024-03-07 ENCOUNTER — Encounter: Payer: Self-pay | Admitting: Internal Medicine

## 2024-03-07 VITALS — BP 104/72 | HR 102 | Temp 97.7°F | Ht 64.0 in | Wt 126.0 lb

## 2024-03-07 DIAGNOSIS — M810 Age-related osteoporosis without current pathological fracture: Secondary | ICD-10-CM

## 2024-03-07 DIAGNOSIS — E7849 Other hyperlipidemia: Secondary | ICD-10-CM

## 2024-03-07 DIAGNOSIS — R7303 Prediabetes: Secondary | ICD-10-CM

## 2024-03-07 DIAGNOSIS — Z0000 Encounter for general adult medical examination without abnormal findings: Secondary | ICD-10-CM

## 2024-03-07 DIAGNOSIS — F32A Depression, unspecified: Secondary | ICD-10-CM

## 2024-03-07 DIAGNOSIS — F419 Anxiety disorder, unspecified: Secondary | ICD-10-CM

## 2024-03-07 DIAGNOSIS — E559 Vitamin D deficiency, unspecified: Secondary | ICD-10-CM | POA: Diagnosis not present

## 2024-03-07 DIAGNOSIS — I351 Nonrheumatic aortic (valve) insufficiency: Secondary | ICD-10-CM

## 2024-03-07 LAB — CBC WITH DIFFERENTIAL/PLATELET
Basophils Absolute: 0.1 10*3/uL (ref 0.0–0.1)
Basophils Relative: 1.1 % (ref 0.0–3.0)
Eosinophils Absolute: 0.3 10*3/uL (ref 0.0–0.7)
Eosinophils Relative: 5.6 % — ABNORMAL HIGH (ref 0.0–5.0)
HCT: 41.2 % (ref 36.0–46.0)
Hemoglobin: 13.4 g/dL (ref 12.0–15.0)
Lymphocytes Relative: 27.9 % (ref 12.0–46.0)
Lymphs Abs: 1.3 10*3/uL (ref 0.7–4.0)
MCHC: 32.5 g/dL (ref 30.0–36.0)
MCV: 90.7 fl (ref 78.0–100.0)
Monocytes Absolute: 0.4 10*3/uL (ref 0.1–1.0)
Monocytes Relative: 8.8 % (ref 3.0–12.0)
Neutro Abs: 2.7 10*3/uL (ref 1.4–7.7)
Neutrophils Relative %: 56.6 % (ref 43.0–77.0)
Platelets: 243 10*3/uL (ref 150.0–400.0)
RBC: 4.54 Mil/uL (ref 3.87–5.11)
RDW: 13.4 % (ref 11.5–15.5)
WBC: 4.8 10*3/uL (ref 4.0–10.5)

## 2024-03-07 LAB — COMPREHENSIVE METABOLIC PANEL WITH GFR
ALT: 21 U/L (ref 0–35)
AST: 25 U/L (ref 0–37)
Albumin: 4.7 g/dL (ref 3.5–5.2)
Alkaline Phosphatase: 57 U/L (ref 39–117)
BUN: 22 mg/dL (ref 6–23)
CO2: 30 meq/L (ref 19–32)
Calcium: 9.9 mg/dL (ref 8.4–10.5)
Chloride: 103 meq/L (ref 96–112)
Creatinine, Ser: 1 mg/dL (ref 0.40–1.20)
GFR: 54.09 mL/min — ABNORMAL LOW (ref >60.00)
Glucose, Bld: 100 mg/dL — ABNORMAL HIGH (ref 70–99)
Potassium: 4.7 meq/L (ref 3.5–5.1)
Sodium: 139 meq/L (ref 135–145)
Total Bilirubin: 0.6 mg/dL (ref 0.2–1.2)
Total Protein: 7 g/dL (ref 6.0–8.3)

## 2024-03-07 LAB — LIPID PANEL
Cholesterol: 174 mg/dL (ref 0–200)
HDL: 88.6 mg/dL (ref >39.00)
LDL Cholesterol: 65 mg/dL (ref 0–99)
NonHDL: 84.94
Total CHOL/HDL Ratio: 2
Triglycerides: 98 mg/dL (ref 0.0–149.0)
VLDL: 19.6 mg/dL (ref 0.0–40.0)

## 2024-03-07 LAB — VITAMIN D 25 HYDROXY (VIT D DEFICIENCY, FRACTURES): VITD: 62.97 ng/mL (ref 30.00–100.00)

## 2024-03-07 LAB — TSH: TSH: 2.72 u[IU]/mL (ref 0.35–5.50)

## 2024-03-07 LAB — HEMOGLOBIN A1C: Hgb A1c MFr Bld: 6 % (ref 4.6–6.5)

## 2024-03-07 MED ORDER — BUPROPION HCL ER (XL) 150 MG PO TB24
150.0000 mg | ORAL_TABLET | Freq: Every day | ORAL | 3 refills | Status: AC
  Filled 2024-03-07: qty 60, 60d supply, fill #0
  Filled 2024-03-07: qty 30, 30d supply, fill #0
  Filled 2024-06-03: qty 90, 90d supply, fill #1
  Filled 2024-09-02: qty 90, 90d supply, fill #2
  Filled 2024-11-30: qty 90, 90d supply, fill #3

## 2024-03-07 MED ORDER — ALPRAZOLAM 0.25 MG PO TABS
0.2500 mg | ORAL_TABLET | Freq: Every evening | ORAL | 1 refills | Status: AC | PRN
  Filled 2024-03-07: qty 30, 30d supply, fill #0

## 2024-03-07 MED ORDER — ALENDRONATE SODIUM 70 MG PO TABS
70.0000 mg | ORAL_TABLET | ORAL | 3 refills | Status: AC
  Filled 2024-03-07 – 2024-05-06 (×2): qty 12, 84d supply, fill #0
  Filled 2024-07-31: qty 12, 84d supply, fill #1
  Filled 2024-10-31: qty 12, 84d supply, fill #2

## 2024-03-07 MED ORDER — ROSUVASTATIN CALCIUM 10 MG PO TABS
10.0000 mg | ORAL_TABLET | Freq: Every day | ORAL | 3 refills | Status: AC
  Filled 2024-03-07: qty 90, 90d supply, fill #0
  Filled 2024-06-03: qty 90, 90d supply, fill #1
  Filled 2024-09-02: qty 90, 90d supply, fill #2
  Filled 2024-11-30: qty 90, 90d supply, fill #3

## 2024-03-07 MED ORDER — HYDROCHLOROTHIAZIDE 12.5 MG PO CAPS
12.5000 mg | ORAL_CAPSULE | Freq: Every day | ORAL | 3 refills | Status: AC
  Filled 2024-03-07 – 2024-04-10 (×2): qty 90, 90d supply, fill #0
  Filled 2024-07-16: qty 90, 90d supply, fill #1
  Filled 2024-10-07: qty 86, 86d supply, fill #2
  Filled 2024-10-07: qty 4, 4d supply, fill #2

## 2024-03-07 MED ORDER — TRAMADOL HCL 50 MG PO TABS
50.0000 mg | ORAL_TABLET | Freq: Four times a day (QID) | ORAL | 3 refills | Status: AC | PRN
  Filled 2024-03-07: qty 60, 15d supply, fill #0

## 2024-03-07 MED ORDER — DICLOFENAC SODIUM 75 MG PO TBEC
75.0000 mg | DELAYED_RELEASE_TABLET | Freq: Two times a day (BID) | ORAL | 3 refills | Status: AC | PRN
  Filled 2024-03-07 – 2024-07-16 (×2): qty 180, 90d supply, fill #0

## 2024-03-07 NOTE — Assessment & Plan Note (Signed)
 Chronic Continue regular exercise and healthy diet  Check lipid panel, CMP, TSH Continue Crestor 10 mg daily, Zetia 10 mg daily

## 2024-03-07 NOTE — Assessment & Plan Note (Signed)
Chronic Asymptomatic Monitored by Dr. Tenny Craw

## 2024-03-07 NOTE — Assessment & Plan Note (Signed)
 Chronic Taking vitamin D daily Check vitamin D level

## 2024-03-07 NOTE — Assessment & Plan Note (Signed)
 Chronic Lab Results  Component Value Date   HGBA1C 6.1 03/02/2023   Check a1c Low sugar / carb diet Continue regular exercise

## 2024-03-07 NOTE — Assessment & Plan Note (Signed)
Chronic Controlled Uses alprazolam only for flying and traveling Continue bupropion XL 150 mg daily

## 2024-03-07 NOTE — Assessment & Plan Note (Signed)
 Chronic DEXA up-to-date Started on Fosamax 70 mg weekly 08/2023-plan to continue for 5 years She is exercising regularly Continue calcium and vitamin D intake Check vitamin D level

## 2024-03-23 DIAGNOSIS — M25531 Pain in right wrist: Secondary | ICD-10-CM | POA: Diagnosis not present

## 2024-03-27 DIAGNOSIS — S60211A Contusion of right wrist, initial encounter: Secondary | ICD-10-CM | POA: Diagnosis not present

## 2024-04-10 ENCOUNTER — Other Ambulatory Visit (HOSPITAL_COMMUNITY): Payer: Self-pay

## 2024-04-10 DIAGNOSIS — S60211D Contusion of right wrist, subsequent encounter: Secondary | ICD-10-CM | POA: Diagnosis not present

## 2024-04-10 DIAGNOSIS — M25531 Pain in right wrist: Secondary | ICD-10-CM | POA: Diagnosis not present

## 2024-04-11 ENCOUNTER — Other Ambulatory Visit: Payer: Self-pay

## 2024-04-11 ENCOUNTER — Other Ambulatory Visit (HOSPITAL_COMMUNITY): Payer: Self-pay

## 2024-04-23 DIAGNOSIS — I8391 Asymptomatic varicose veins of right lower extremity: Secondary | ICD-10-CM | POA: Diagnosis not present

## 2024-04-23 DIAGNOSIS — L814 Other melanin hyperpigmentation: Secondary | ICD-10-CM | POA: Diagnosis not present

## 2024-04-23 DIAGNOSIS — Z85828 Personal history of other malignant neoplasm of skin: Secondary | ICD-10-CM | POA: Diagnosis not present

## 2024-04-23 DIAGNOSIS — L821 Other seborrheic keratosis: Secondary | ICD-10-CM | POA: Diagnosis not present

## 2024-04-23 DIAGNOSIS — D1801 Hemangioma of skin and subcutaneous tissue: Secondary | ICD-10-CM | POA: Diagnosis not present

## 2024-05-06 ENCOUNTER — Other Ambulatory Visit (HOSPITAL_COMMUNITY): Payer: Self-pay

## 2024-05-07 ENCOUNTER — Encounter: Payer: Self-pay | Admitting: Internal Medicine

## 2024-06-03 ENCOUNTER — Other Ambulatory Visit (HOSPITAL_COMMUNITY): Payer: Self-pay

## 2024-07-16 ENCOUNTER — Other Ambulatory Visit: Payer: Self-pay | Admitting: Internal Medicine

## 2024-07-16 ENCOUNTER — Other Ambulatory Visit: Payer: Self-pay

## 2024-07-16 ENCOUNTER — Other Ambulatory Visit (HOSPITAL_COMMUNITY): Payer: Self-pay

## 2024-07-16 MED ORDER — EZETIMIBE 10 MG PO TABS
10.0000 mg | ORAL_TABLET | Freq: Every day | ORAL | 0 refills | Status: DC
  Filled 2024-07-16 – 2024-09-02 (×2): qty 30, 30d supply, fill #0

## 2024-07-25 ENCOUNTER — Other Ambulatory Visit (HOSPITAL_COMMUNITY): Payer: Self-pay

## 2024-08-01 ENCOUNTER — Other Ambulatory Visit (HOSPITAL_COMMUNITY): Payer: Self-pay

## 2024-08-14 ENCOUNTER — Encounter: Payer: Self-pay | Admitting: Internal Medicine

## 2024-08-29 ENCOUNTER — Encounter: Payer: Self-pay | Admitting: Internal Medicine

## 2024-09-02 ENCOUNTER — Other Ambulatory Visit (HOSPITAL_COMMUNITY): Payer: Self-pay

## 2024-09-02 ENCOUNTER — Other Ambulatory Visit: Payer: Self-pay

## 2024-09-03 ENCOUNTER — Other Ambulatory Visit (HOSPITAL_COMMUNITY): Payer: Self-pay

## 2024-10-07 ENCOUNTER — Other Ambulatory Visit (HOSPITAL_COMMUNITY): Payer: Self-pay

## 2024-10-07 ENCOUNTER — Other Ambulatory Visit: Payer: Self-pay | Admitting: Internal Medicine

## 2024-10-07 MED ORDER — EZETIMIBE 10 MG PO TABS
10.0000 mg | ORAL_TABLET | Freq: Every day | ORAL | 0 refills | Status: AC
  Filled 2024-10-07: qty 30, 30d supply, fill #0

## 2024-12-11 ENCOUNTER — Encounter: Payer: Self-pay | Admitting: Internal Medicine

## 2024-12-11 DIAGNOSIS — I351 Nonrheumatic aortic (valve) insufficiency: Secondary | ICD-10-CM

## 2024-12-16 NOTE — Telephone Encounter (Signed)
 Please schedule echo to follow Aortic valve  Confirm that pt has  follow up with me in April

## 2024-12-18 ENCOUNTER — Ambulatory Visit (HOSPITAL_COMMUNITY)
Admission: RE | Admit: 2024-12-18 | Discharge: 2024-12-18 | Disposition: A | Discharge status: Home / Self Care | Source: Ambulatory Visit | Attending: Cardiovascular Disease | Admitting: Cardiovascular Disease

## 2024-12-18 DIAGNOSIS — I359 Nonrheumatic aortic valve disorder, unspecified: Secondary | ICD-10-CM | POA: Diagnosis not present

## 2024-12-18 DIAGNOSIS — I351 Nonrheumatic aortic (valve) insufficiency: Secondary | ICD-10-CM | POA: Insufficient documentation

## 2024-12-18 LAB — ECHOCARDIOGRAM COMPLETE
AR max vel: 1.35 cm2
AV Area VTI: 1.38 cm2
AV Area mean vel: 1.38 cm2
AV Mean grad: 8 mmHg
AV Peak grad: 17.1 mmHg
Ao pk vel: 2.07 m/s
Area-P 1/2: 3.11 cm2
P 1/2 time: 677 ms
S' Lateral: 3.3 cm

## 2024-12-20 ENCOUNTER — Ambulatory Visit: Payer: Self-pay | Admitting: Internal Medicine

## 2025-01-08 ENCOUNTER — Ambulatory Visit: Payer: PPO

## 2025-01-10 ENCOUNTER — Ambulatory Visit

## 2025-03-11 ENCOUNTER — Encounter: Admitting: Internal Medicine

## 2025-03-14 ENCOUNTER — Ambulatory Visit: Admitting: Internal Medicine
